# Patient Record
Sex: Female | Born: 1995 | Race: Black or African American | Hispanic: No | Marital: Single | Smoking: Never smoker
Health system: Southern US, Community
[De-identification: ages and names within clinical notes are randomized; demographics above are authoritative.]

## PROBLEM LIST (undated history)

## (undated) ENCOUNTER — Inpatient Hospital Stay (HOSPITAL_COMMUNITY): Payer: Self-pay

## (undated) DIAGNOSIS — I10 Essential (primary) hypertension: Secondary | ICD-10-CM

## (undated) DIAGNOSIS — O139 Gestational [pregnancy-induced] hypertension without significant proteinuria, unspecified trimester: Secondary | ICD-10-CM

## (undated) DIAGNOSIS — D699 Hemorrhagic condition, unspecified: Secondary | ICD-10-CM

## (undated) DIAGNOSIS — A749 Chlamydial infection, unspecified: Secondary | ICD-10-CM

## (undated) DIAGNOSIS — F419 Anxiety disorder, unspecified: Secondary | ICD-10-CM

## (undated) HISTORY — DX: Anxiety disorder, unspecified: F41.9

## (undated) HISTORY — PX: DILATION AND CURETTAGE OF UTERUS: SHX78

## (undated) HISTORY — DX: Essential (primary) hypertension: I10

## (undated) HISTORY — DX: Hemorrhagic condition, unspecified: D69.9

## (undated) HISTORY — PX: WISDOM TOOTH EXTRACTION: SHX21

---
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
## 2016-12-16 ENCOUNTER — Inpatient Hospital Stay (HOSPITAL_COMMUNITY)
Admission: AD | Admit: 2016-12-16 | Discharge: 2016-12-16 | Disposition: A | Discharge status: Home / Self Care | Payer: Self-pay | Source: Ambulatory Visit | Attending: Obstetrics & Gynecology | Admitting: Obstetrics & Gynecology

## 2016-12-16 ENCOUNTER — Encounter (HOSPITAL_COMMUNITY): Payer: Self-pay | Admitting: *Deleted

## 2016-12-16 DIAGNOSIS — B3731 Acute candidiasis of vulva and vagina: Secondary | ICD-10-CM

## 2016-12-16 DIAGNOSIS — B373 Candidiasis of vulva and vagina: Secondary | ICD-10-CM | POA: Insufficient documentation

## 2016-12-16 DIAGNOSIS — R102 Pelvic and perineal pain: Secondary | ICD-10-CM | POA: Insufficient documentation

## 2016-12-16 LAB — CBC WITH DIFFERENTIAL/PLATELET
BASOS ABS: 0 10*3/uL (ref 0.0–0.1)
BASOS PCT: 0 %
EOS PCT: 1 %
Eosinophils Absolute: 0.1 10*3/uL (ref 0.0–0.7)
HCT: 38.6 % (ref 36.0–46.0)
Hemoglobin: 12.5 g/dL (ref 12.0–15.0)
Lymphocytes Relative: 40 %
Lymphs Abs: 3 10*3/uL (ref 0.7–4.0)
MCH: 28.5 pg (ref 26.0–34.0)
MCHC: 32.4 g/dL (ref 30.0–36.0)
MCV: 87.9 fL (ref 78.0–100.0)
MONO ABS: 0.2 10*3/uL (ref 0.1–1.0)
Monocytes Relative: 2 %
Neutro Abs: 4.2 10*3/uL (ref 1.7–7.7)
Neutrophils Relative %: 57 %
PLATELETS: 294 10*3/uL (ref 150–400)
RBC: 4.39 MIL/uL (ref 3.87–5.11)
RDW: 13.5 % (ref 11.5–15.5)
WBC: 7.5 10*3/uL (ref 4.0–10.5)

## 2016-12-16 LAB — URINALYSIS, ROUTINE W REFLEX MICROSCOPIC
Bilirubin Urine: NEGATIVE
Glucose, UA: NEGATIVE mg/dL
Hgb urine dipstick: NEGATIVE
Ketones, ur: NEGATIVE mg/dL
Nitrite: NEGATIVE
PH: 7 (ref 5.0–8.0)
Protein, ur: NEGATIVE mg/dL
SPECIFIC GRAVITY, URINE: 1.02 (ref 1.005–1.030)

## 2016-12-16 LAB — WET PREP, GENITAL
Clue Cells Wet Prep HPF POC: NONE SEEN
SPERM: NONE SEEN
TRICH WET PREP: NONE SEEN

## 2016-12-16 LAB — POCT PREGNANCY, URINE: Preg Test, Ur: NEGATIVE

## 2016-12-16 MED ORDER — NAPROXEN SODIUM 550 MG PO TABS: 550.0000 mg | ORAL_TABLET | Freq: Two times a day (BID) | ORAL | 0 refills | Status: DC

## 2016-12-16 MED ORDER — FLUCONAZOLE 200 MG PO TABS
200.0000 mg | ORAL_TABLET | Freq: Once | ORAL | Status: AC
  Administered 2016-12-16: 200 mg via ORAL
  Filled 2016-12-16: qty 1

## 2016-12-16 MED ORDER — KETOROLAC TROMETHAMINE 60 MG/2ML IM SOLN
30.0000 mg | Freq: Once | INTRAMUSCULAR | Status: AC
  Administered 2016-12-16: 30 mg via INTRAMUSCULAR
  Filled 2016-12-16: qty 2

## 2016-12-16 NOTE — MAU Provider Note (Signed)
[facility] AT [facility] Provider Note   CSN: [id number] Arrival date & time: 12/16/16  1237     History   Chief Complaint Chief Complaint  Patient presents with  . Pelvic Pain  . Possible Pregnancy    HPI Samantha Stanton is a 21 y.o. G1P1  who presents to the MAU with complaint of pelvic pain. The pain started 4 days ago. Patient has been taking Advil without relief. The pain comes and goes and is sharp. Patient's last menstrual period was 11/26/2016. Patient reports having one female sex partner that she has been with for 4 years. Hx of Chlamydia.   Pelvic Pain  The patient's primary symptoms include genital itching, a genital odor, pelvic pain and vaginal discharge. The patient's pertinent negatives include no genital lesions, missed menses or vaginal bleeding. This is a new problem. The current episode started in the past 7 days. The problem occurs intermittently. The problem has been gradually worsening. The pain is moderate. The problem affects both sides. She is not pregnant. Associated symptoms include abdominal pain. Pertinent negatives include no back pain, chills, diarrhea, discolored urine, dysuria, fever, frequency, headaches, hematuria, nausea or vomiting. The vaginal discharge was malodorous and mucoid. There has been no bleeding. She has not been passing clots. She has not been passing tissue. Nothing aggravates the symptoms. She has tried NSAIDs for the symptoms. The treatment provided mild relief. She is sexually active. No, her partner does not have an STD. She uses nothing for contraception. Her past medical history is significant for an STD. There is no history of an abdominal surgery, a Cesarean section, an ectopic pregnancy, endometriosis, a gynecological surgery, herpes simplex or miscarriage.  Possible Pregnancy  Associated symptoms include abdominal pain. Pertinent negatives include no chest pain, chills, fever, headaches, nausea or vomiting.    Past Medical History:    Diagnosis Date  . Medical history non-contributory     There are no active problems to display for this patient.   Past Surgical History:  Procedure Laterality Date  . WISDOM TOOTH EXTRACTION      OB History    Gravida Para Term Preterm AB Living   1 1       1    SAB TAB Ectopic Multiple Live Births                   Home Medications    Prior to Admission medications   Medication Sig Start Date End Date Taking? Authorizing Provider  naproxen sodium (ANAPROX) 220 MG tablet Take 220 mg by mouth 2 (two) times daily as needed (pain).    Yes [provider]  Prenatal Vit-Fe Fumarate-FA (PRENATAL MULTIVITAMIN) TABS tablet Take 1 tablet by mouth daily at 12 noon.   Yes [provider]  naproxen sodium (ANAPROX DS) 550 MG tablet Take 1 tablet (550 mg total) by mouth 2 (two) times daily with a meal. 12/16/16   Janne Napoleon, NP    Family History History reviewed. No pertinent family history.  Social History Social History  Substance Use Topics  . Smoking status: Never Smoker  . Smokeless tobacco: Never Used  . Alcohol use No     Allergies   Aspirin   Review of Systems Review of Systems  Constitutional: Negative for chills and fever.  HENT: Negative.   Eyes: Negative for pain, itching and visual disturbance.  Respiratory: Negative for chest tightness and shortness of breath.   Cardiovascular: Negative for chest pain.  Gastrointestinal: Positive for abdominal pain.  Negative for diarrhea, nausea and vomiting.  Genitourinary: Positive for pelvic pain and vaginal discharge. Negative for dysuria, frequency, hematuria and missed menses.  Musculoskeletal: Negative for back pain.  Skin: Negative for wound.  Neurological: Negative for syncope and headaches.  Psychiatric/Behavioral: The patient is not nervous/anxious.      Physical Exam Updated Vital Signs BP 132/70 (BP Location: Right Arm)   Pulse 89   Temp 98.6 F (37 C) (Oral)   Resp 18   LMP  11/26/2016   SpO2 100%   Physical Exam  Constitutional: She is oriented to person, place, and time. She appears well-developed and well-nourished. No distress.  HENT:  Head: Normocephalic and atraumatic.  Eyes: EOM are normal.  Neck: Neck supple.  Cardiovascular: Normal rate and regular rhythm.   Pulmonary/Chest: Effort normal and breath sounds normal.  Abdominal: Soft. There is no tenderness.  Genitourinary:  Genitourinary Comments: External genitalia without lesions, thick white d/c vaginal vault, mild CMT, no adnexal mass palpated. Uterus without palpable enlargement.   Musculoskeletal: Normal range of motion.  Neurological: She is alert and oriented to person, place, and time. No cranial nerve deficit.  Skin: Skin is warm and dry.  Psychiatric: She has a normal mood and affect. Her behavior is normal.  Nursing note and vitals reviewed.    ED Treatments / Results  Labs (all labs ordered are listed, but only abnormal results are displayed) Labs Reviewed  WET PREP, GENITAL - Abnormal; Notable for the following:       Result Value   Yeast Wet Prep HPF POC PRESENT (*)    WBC, Wet Prep HPF POC MODERATE (*)    All other components within normal limits  URINALYSIS, ROUTINE W REFLEX MICROSCOPIC - Abnormal; Notable for the following:    APPearance HAZY (*)    Leukocytes, UA LARGE (*)    Bacteria, UA RARE (*)    Squamous Epithelial / LPF 6-30 (*)    All other components within normal limits  URINE CULTURE  CBC WITH DIFFERENTIAL/PLATELET  POCT PREGNANCY, URINE  GC/CHLAMYDIA PROBE AMP ([place]) NOT AT [facility]   Radiology No results found.  Procedures Procedures (including critical care time)  Medications Ordered in ED Medications  ketorolac (TORADOL) injection 30 mg (30 mg Intramuscular Given 12/16/16 1501)  fluconazole (DIFLUCAN) tablet 200 mg (200 mg Oral Given 12/16/16 1500)     Initial Impression / Assessment and Plan / ED Course  I have reviewed the triage vital  signs and the nursing notes.  Pertinent lab results that were available during my care of the patient were reviewed by me and considered in my medical decision making (see chart for details). Toradol 30 mg IM given for pain  Cultures, urine, GC and Chlamydia pending Patient declined HIV and RPR testing  Final Clinical Impressions(s) / ED Diagnoses  21 y.o. female with pelvic pain and vaginal d/c stable for d/c without fever and does not have a surgical abdomen. Will treat for yeast infection and with Diflucan and await cultures. Discussed in detail with the patient clinical and lab findings and plan of care. Patient agrees with plan.   Final diagnoses:  Vaginal yeast infection  Pelvic pain    New Prescriptions Current Discharge Medication List

## 2016-12-16 NOTE — Discharge Instructions (Signed)
We have sent your urine for culture and we have sent cultures for Gonorrhea and Chlamydia. If any of these are positive someone will call you. Take the medication for pain as directed. Follow up with your doctor this week. Return as needed for worsening symptoms.

## 2016-12-16 NOTE — MAU Note (Signed)
Pt reports "extreme pelvic pain" x 4 days, also reports vaginal discharge and itching.

## 2016-12-17 LAB — GC/CHLAMYDIA PROBE AMP (~~LOC~~) NOT AT ARMC
CHLAMYDIA, DNA PROBE: POSITIVE — AB
NEISSERIA GONORRHEA: NEGATIVE

## 2016-12-17 LAB — URINE CULTURE

## 2016-12-18 ENCOUNTER — Telehealth: Payer: Self-pay | Admitting: Medical

## 2016-12-18 DIAGNOSIS — A749 Chlamydial infection, unspecified: Secondary | ICD-10-CM

## 2016-12-18 MED ORDER — AZITHROMYCIN 250 MG PO TABS: 1000.0000 mg | ORAL_TABLET | Freq: Once | ORAL | 0 refills | Status: AC

## 2016-12-18 NOTE — Telephone Encounter (Addendum)
Samantha Stanton tested positive for  Chlamydia. Patient was called by RN and allergies and pharmacy confirmed. Rx sent to pharmacy of choice.   Marny LowensteinWenzel, Julie N, PA-C 12/18/2016 11:27 AM       ----- Message from Kathe BectonLori S Berdik, RN sent at 12/18/2016 11:13 AM EDT ----- This patient tested positive for Chlamydia  She is allergic to "aspirin".   I have informed the patient of her results and confirmed her pharmacy is correct in her chart. Please send Rx.   Thank you,   Kathe BectonBerdik, Lori S, RN   [facility]

## 2017-09-02 ENCOUNTER — Emergency Department (HOSPITAL_BASED_OUTPATIENT_CLINIC_OR_DEPARTMENT_OTHER): Payer: Self-pay

## 2017-09-02 ENCOUNTER — Emergency Department (HOSPITAL_BASED_OUTPATIENT_CLINIC_OR_DEPARTMENT_OTHER)
Admission: EM | Admit: 2017-09-02 | Discharge: 2017-09-03 | Disposition: A | Discharge status: Home / Self Care | Payer: Self-pay | Attending: Emergency Medicine | Admitting: Emergency Medicine

## 2017-09-02 ENCOUNTER — Encounter (HOSPITAL_BASED_OUTPATIENT_CLINIC_OR_DEPARTMENT_OTHER): Payer: Self-pay | Admitting: Emergency Medicine

## 2017-09-02 ENCOUNTER — Other Ambulatory Visit: Payer: Self-pay

## 2017-09-02 DIAGNOSIS — N390 Urinary tract infection, site not specified: Secondary | ICD-10-CM | POA: Insufficient documentation

## 2017-09-02 DIAGNOSIS — R112 Nausea with vomiting, unspecified: Secondary | ICD-10-CM | POA: Insufficient documentation

## 2017-09-02 DIAGNOSIS — R1031 Right lower quadrant pain: Secondary | ICD-10-CM | POA: Insufficient documentation

## 2017-09-02 DIAGNOSIS — R197 Diarrhea, unspecified: Secondary | ICD-10-CM

## 2017-09-02 DIAGNOSIS — R109 Unspecified abdominal pain: Secondary | ICD-10-CM

## 2017-09-02 LAB — CBC
HCT: 42 % (ref 36.0–46.0)
Hemoglobin: 14.9 g/dL (ref 12.0–15.0)
MCH: 30.2 pg (ref 26.0–34.0)
MCHC: 35.5 g/dL (ref 30.0–36.0)
MCV: 85 fL (ref 78.0–100.0)
Platelets: 301 10*3/uL (ref 150–400)
RBC: 4.94 MIL/uL (ref 3.87–5.11)
RDW: 13.2 % (ref 11.5–15.5)
WBC: 9.2 10*3/uL (ref 4.0–10.5)

## 2017-09-02 LAB — COMPREHENSIVE METABOLIC PANEL
ALBUMIN: 4 g/dL (ref 3.5–5.0)
ALK PHOS: 39 U/L (ref 38–126)
ALT: 31 U/L (ref 14–54)
AST: 30 U/L (ref 15–41)
Anion gap: 11 (ref 5–15)
BILIRUBIN TOTAL: 0.3 mg/dL (ref 0.3–1.2)
BUN: 17 mg/dL (ref 6–20)
CALCIUM: 9.9 mg/dL (ref 8.9–10.3)
CO2: 24 mmol/L (ref 22–32)
CREATININE: 1.5 mg/dL — AB (ref 0.44–1.00)
Chloride: 103 mmol/L (ref 101–111)
GFR calc Af Amer: 57 mL/min — ABNORMAL LOW (ref >60)
GFR calc non Af Amer: 49 mL/min — ABNORMAL LOW (ref >60)
GLUCOSE: 106 mg/dL — AB (ref 65–99)
Potassium: 3.9 mmol/L (ref 3.5–5.1)
Sodium: 138 mmol/L (ref 135–145)
TOTAL PROTEIN: 8.3 g/dL — AB (ref 6.5–8.1)

## 2017-09-02 LAB — LIPASE, BLOOD: Lipase: 34 U/L (ref 11–51)

## 2017-09-02 LAB — URINALYSIS, MICROSCOPIC (REFLEX): RBC / HPF: NONE SEEN RBC/hpf (ref 0–5)

## 2017-09-02 LAB — URINALYSIS, ROUTINE W REFLEX MICROSCOPIC
BILIRUBIN URINE: NEGATIVE
Glucose, UA: NEGATIVE mg/dL
HGB URINE DIPSTICK: NEGATIVE
KETONES UR: NEGATIVE mg/dL
NITRITE: NEGATIVE
Protein, ur: NEGATIVE mg/dL
Specific Gravity, Urine: <1.005 — ABNORMAL LOW (ref 1.005–1.030)
pH: 7 (ref 5.0–8.0)

## 2017-09-02 LAB — PREGNANCY, URINE: PREG TEST UR: NEGATIVE

## 2017-09-02 MED ORDER — MORPHINE SULFATE (PF) 4 MG/ML IV SOLN
4.0000 mg | Freq: Once | INTRAVENOUS | Status: AC
  Administered 2017-09-02: 4 mg via INTRAVENOUS
  Filled 2017-09-02: qty 1

## 2017-09-02 MED ORDER — ONDANSETRON 4 MG PO TBDP
4.0000 mg | ORAL_TABLET | Freq: Once | ORAL | Status: AC
  Administered 2017-09-02: 4 mg via ORAL
  Filled 2017-09-02: qty 1

## 2017-09-02 MED ORDER — SODIUM CHLORIDE 0.9 % IV BOLUS
1000.0000 mL | Freq: Once | INTRAVENOUS | Status: AC
  Administered 2017-09-02: 1000 mL via INTRAVENOUS

## 2017-09-02 MED ORDER — IOPAMIDOL (ISOVUE-300) INJECTION 61%
100.0000 mL | Freq: Once | INTRAVENOUS | Status: AC | PRN
  Administered 2017-09-03: 100 mL via INTRAVENOUS

## 2017-09-02 NOTE — ED Provider Notes (Signed)
[facility] EMERGENCY DEPARTMENT Provider Note   CSN: [id number] Arrival date & time: 09/02/17  1832     History   Chief Complaint Chief Complaint  Patient presents with  . Abdominal Pain    HPI Samantha Stanton is a 22 y.o. female.  Complains of right-sided flank pain radiating to right abdomen onset 6 AM today associated symptoms include vomiting 25 times and 10 episodes of diarrhea.  She feels thirsty.  She denies feeling hungry.  She treated herself with "Backaid" without relief.  Last normal menstrual period approximately a week ago.  Denies vaginal discharge.  Denies fever.  Pain is worse when she vomits or with lying in certain positions and improved with other positions pain is severe at present.  HPI  Past Medical History:  Diagnosis Date  . Medical history non-contributory     There are no active problems to display for this patient.   Past Surgical History:  Procedure Laterality Date  . WISDOM TOOTH EXTRACTION       OB History    Gravida  1   Para  1   Term      Preterm      AB      Living  1     SAB      TAB      Ectopic      Multiple      Live Births               Home Medications    Prior to Admission medications   Medication Sig Start Date End Date Taking? Authorizing Provider  naproxen sodium (ANAPROX DS) 550 MG tablet Take 1 tablet (550 mg total) by mouth 2 (two) times daily with a meal. 12/16/16   Janne NapoleonNeese, Hope M, NP  Prenatal Vit-Fe Fumarate-FA (PRENATAL MULTIVITAMIN) TABS tablet Take 1 tablet by mouth daily at 12 noon.    [provider]    Family History History reviewed. No pertinent family history.  Social History Social History   Tobacco Use  . Smoking status: Never Smoker  . Smokeless tobacco: Never Used  Substance Use Topics  . Alcohol use: No  . Drug use: No     Allergies   Aspirin   Review of Systems Review of Systems  Constitutional: Negative.   HENT: Negative.   Respiratory:  Negative.   Cardiovascular: Negative.   Gastrointestinal: Positive for abdominal pain and nausea.  Genitourinary: Positive for flank pain.  Skin: Negative.   Neurological: Negative.   Psychiatric/Behavioral: Negative.   All other systems reviewed and are negative.    Physical Exam Updated Vital Signs BP 131/85 (BP Location: Right Arm)   Pulse 97   Temp 99.1 F (37.3 C) (Oral)   Resp 18   Ht 5\' 6"  (1.676 m)   Wt 84.8 kg (187 lb)   LMP 08/26/2017 (Approximate)   SpO2 100%   BMI 30.18 kg/m   Physical Exam  Constitutional: She appears well-developed and well-nourished.  HENT:  Head: Normocephalic and atraumatic.  Eyes: Pupils are equal, round, and reactive to light. Conjunctivae are normal.  Neck: Neck supple. No tracheal deviation present. No thyromegaly present.  Cardiovascular: Normal rate and regular rhythm.  No murmur heard. Pulmonary/Chest: Effort normal and breath sounds normal.  Abdominal: Soft. Bowel sounds are normal. She exhibits no distension. There is tenderness.  Morbidly obese, dermal active bowel sounds diffusely tender.  No guarding  Genitourinary:  Genitourinary Comments: Positive right flank tenderness  Musculoskeletal: Normal range  of motion. She exhibits no edema or tenderness.  Neurological: She is alert. Coordination normal.  Skin: Skin is warm and dry. No rash noted.  Psychiatric: She has a normal mood and affect.  Nursing note and vitals reviewed.    ED Treatments / Results  Labs (all labs ordered are listed, but only abnormal results are displayed) Labs Reviewed  URINALYSIS, ROUTINE W REFLEX MICROSCOPIC - Abnormal; Notable for the following components:      Result Value   Specific Gravity, Urine <1.005 (*)    Leukocytes, UA TRACE (*)    All other components within normal limits  URINALYSIS, MICROSCOPIC (REFLEX) - Abnormal; Notable for the following components:   Bacteria, UA RARE (*)    Squamous Epithelial / LPF 0-5 (*)    All other  components within normal limits  PREGNANCY, URINE  LIPASE, BLOOD  COMPREHENSIVE METABOLIC PANEL  CBC    EKG None  Radiology No results found.  Procedures Procedures (including critical care time)  Medications Ordered in ED Medications  sodium chloride 0.9 % bolus 1,000 mL (has no administration in time range)  morphine 4 MG/ML injection 4 mg (has no administration in time range)  ondansetron (ZOFRAN-ODT) disintegrating tablet 4 mg (4 mg Oral Given 09/02/17 1952)    Results for orders placed or performed during the hospital encounter of 09/02/17  Lipase, blood  Result Value Ref Range   Lipase 34 11 - 51 U/L  Comprehensive metabolic panel  Result Value Ref Range   Sodium 138 135 - 145 mmol/L   Potassium 3.9 3.5 - 5.1 mmol/L   Chloride 103 101 - 111 mmol/L   CO2 24 22 - 32 mmol/L   Glucose, Bld 106 (H) 65 - 99 mg/dL   BUN 17 6 - 20 mg/dL   Creatinine, Ser 1.61 (H) 0.44 - 1.00 mg/dL   Calcium 9.9 8.9 - 09.6 mg/dL   Total Protein 8.3 (H) 6.5 - 8.1 g/dL   Albumin 4.0 3.5 - 5.0 g/dL   AST 30 15 - 41 U/L   ALT 31 14 - 54 U/L   Alkaline Phosphatase 39 38 - 126 U/L   Total Bilirubin 0.3 0.3 - 1.2 mg/dL   GFR calc non Af Amer 49 (L) >60 mL/min   GFR calc Af Amer 57 (L) >60 mL/min   Anion gap 11 5 - 15  CBC  Result Value Ref Range   WBC 9.2 4.0 - 10.5 K/uL   RBC 4.94 3.87 - 5.11 MIL/uL   Hemoglobin 14.9 12.0 - 15.0 g/dL   HCT 04.5 40.9 - 81.1 %   MCV 85.0 78.0 - 100.0 fL   MCH 30.2 26.0 - 34.0 pg   MCHC 35.5 30.0 - 36.0 g/dL   RDW 91.4 78.2 - 95.6 %   Platelets 301 150 - 400 K/uL  Urinalysis, Routine w reflex microscopic  Result Value Ref Range   Color, Urine YELLOW YELLOW   APPearance CLEAR CLEAR   Specific Gravity, Urine <1.005 (L) 1.005 - 1.030   pH 7.0 5.0 - 8.0   Glucose, UA NEGATIVE NEGATIVE mg/dL   Hgb urine dipstick NEGATIVE NEGATIVE   Bilirubin Urine NEGATIVE NEGATIVE   Ketones, ur NEGATIVE NEGATIVE mg/dL   Protein, ur NEGATIVE NEGATIVE mg/dL   Nitrite  NEGATIVE NEGATIVE   Leukocytes, UA TRACE (A) NEGATIVE  Pregnancy, urine  Result Value Ref Range   Preg Test, Ur NEGATIVE NEGATIVE  Urinalysis, Microscopic (reflex)  Result Value Ref Range   RBC / HPF NONE  SEEN 0 - 5 RBC/hpf   WBC, UA 0-5 0 - 5 WBC/hpf   Bacteria, UA RARE (A) NONE SEEN   Squamous Epithelial / LPF 0-5 (A) NONE SEEN   No results found. Initial Impression / Assessment and Plan / ED Course  I have reviewed the triage vital signs and the nursing notes.  Pertinent labs & imaging results that were available during my care of the patient were reviewed by me and considered in my medical decision making (see chart for details).     12:25 AM reports no improvement of pain after morphine 4 mg IV.  An additional morphine 4 mg IV ordered by me 12:35 AM patient complains of nausea.  IV Reglan ordered.  Patient signed out to Dr. Blinda Leatherwood 12:35 AM.  Lab work consistent with mild renal insufficiency #4 renal insufficiency Final Clinical Impressions(s) / ED Diagnoses  Diagnoses #1 right flank pain #2 abdominal pain #3 nausea vomiting diarrhea Final diagnoses:  None   #4 renal insufficiency ED Discharge Orders    None       Doug Sou, MD 09/03/17 [phone]

## 2017-09-02 NOTE — ED Triage Notes (Signed)
Patient reports right sided abdominal pain which radiates into back.  Reports N/V/D which began this morning.  Denies dysuria, hematuria.

## 2017-09-03 MED ORDER — TRAMADOL HCL 50 MG PO TABS: 50.0000 mg | ORAL_TABLET | Freq: Four times a day (QID) | ORAL | 0 refills | Status: DC | PRN

## 2017-09-03 MED ORDER — METOCLOPRAMIDE HCL 5 MG/ML IJ SOLN
5.0000 mg | Freq: Once | INTRAMUSCULAR | Status: AC
  Administered 2017-09-03: 5 mg via INTRAVENOUS
  Filled 2017-09-03: qty 2

## 2017-09-03 MED ORDER — HYDROMORPHONE HCL 1 MG/ML IJ SOLN
0.5000 mg | Freq: Once | INTRAMUSCULAR | Status: AC
  Administered 2017-09-03: 0.5 mg via INTRAVENOUS
  Filled 2017-09-03: qty 1

## 2017-09-03 MED ORDER — SODIUM CHLORIDE 0.9 % IV BOLUS
1000.0000 mL | Freq: Once | INTRAVENOUS | Status: AC
  Administered 2017-09-03: 1000 mL via INTRAVENOUS

## 2017-09-03 MED ORDER — PROMETHAZINE HCL 25 MG PO TABS: 25.0000 mg | ORAL_TABLET | Freq: Four times a day (QID) | ORAL | 0 refills | Status: DC | PRN

## 2017-09-03 MED ORDER — ONDANSETRON 4 MG PO TBDP
4.0000 mg | ORAL_TABLET | Freq: Once | ORAL | Status: AC
  Administered 2017-09-03: 4 mg via ORAL
  Filled 2017-09-03: qty 1

## 2017-09-03 MED ORDER — SODIUM CHLORIDE 0.9 % IV SOLN
1.0000 g | Freq: Once | INTRAVENOUS | Status: AC
  Administered 2017-09-03: 1 g via INTRAVENOUS
  Filled 2017-09-03: qty 10

## 2017-09-03 MED ORDER — CEPHALEXIN 500 MG PO CAPS: 500.0000 mg | ORAL_CAPSULE | Freq: Two times a day (BID) | ORAL | 0 refills | Status: DC

## 2017-09-03 MED ORDER — MORPHINE SULFATE (PF) 4 MG/ML IV SOLN
4.0000 mg | Freq: Once | INTRAVENOUS | Status: AC
  Administered 2017-09-03: 4 mg via INTRAVENOUS
  Filled 2017-09-03: qty 1

## 2017-09-03 NOTE — ED Provider Notes (Addendum)
Patient was signed out to me by Dr. Rennis ChrisJacobowitz to follow-up on CT scan.  Patient complaining of bilateral flank pain.  Laboratory workup was negative other than mildly elevated creatinine, baseline unknown.  CT scan showed bilateral enhancement of the kidneys, possibly secondary to infection.  Urinalysis did not seem obviously infected, culture sent.  Will empirically treat, however.  Patient appears well.  She does not require hospitalization at this time.  She is afebrile with normal vital signs.  No concern for sepsis.  She reports multiple episodes of emesis and diarrhea prior to arrival, but this has resolved here in the ER.  Will treat her with analgesia and antiemetics, continue antibiotics.  She will need to follow-up with her primary doctor for recheck of her creatinine and urinalysis.  She likely will need referral to nephrology and possible urology based on her CAT scan findings.  She was given a copy of her report.  Addendum: After IV was pulled at time of discharge she vomited.  I did recheck her.  She is still not having pain.  She reports that she drinks a lot of Sprite because she has been feeling so thirsty and thinks that is what made her vomit.  I offered her admission but she reports that she is still feeling well and would like to go home.   Gilda CreasePollina, Christopher J, MD 09/03/17 [id number]    Gilda CreasePollina, Christopher J, MD 09/03/17 [phone]

## 2017-09-03 NOTE — ED Notes (Signed)
ED Provider at bedside. 

## 2017-09-04 LAB — URINE CULTURE

## 2018-04-29 ENCOUNTER — Emergency Department (HOSPITAL_BASED_OUTPATIENT_CLINIC_OR_DEPARTMENT_OTHER)
Admission: EM | Admit: 2018-04-29 | Discharge: 2018-04-29 | Disposition: A | Discharge status: Home / Self Care | Payer: 59 | Attending: Emergency Medicine | Admitting: Emergency Medicine

## 2018-04-29 ENCOUNTER — Emergency Department (HOSPITAL_BASED_OUTPATIENT_CLINIC_OR_DEPARTMENT_OTHER): Payer: 59

## 2018-04-29 ENCOUNTER — Encounter (HOSPITAL_BASED_OUTPATIENT_CLINIC_OR_DEPARTMENT_OTHER): Payer: Self-pay

## 2018-04-29 ENCOUNTER — Encounter (HOSPITAL_BASED_OUTPATIENT_CLINIC_OR_DEPARTMENT_OTHER): Payer: Self-pay | Admitting: *Deleted

## 2018-04-29 ENCOUNTER — Other Ambulatory Visit: Payer: Self-pay

## 2018-04-29 DIAGNOSIS — Z3A01 Less than 8 weeks gestation of pregnancy: Secondary | ICD-10-CM | POA: Insufficient documentation

## 2018-04-29 DIAGNOSIS — Z79899 Other long term (current) drug therapy: Secondary | ICD-10-CM | POA: Insufficient documentation

## 2018-04-29 DIAGNOSIS — O26891 Other specified pregnancy related conditions, first trimester: Secondary | ICD-10-CM | POA: Insufficient documentation

## 2018-04-29 DIAGNOSIS — R102 Pelvic and perineal pain: Secondary | ICD-10-CM | POA: Diagnosis not present

## 2018-04-29 DIAGNOSIS — O9989 Other specified diseases and conditions complicating pregnancy, childbirth and the puerperium: Secondary | ICD-10-CM | POA: Diagnosis present

## 2018-04-29 LAB — CBC WITH DIFFERENTIAL/PLATELET
Abs Immature Granulocytes: 0.02 10*3/uL (ref 0.00–0.07)
Basophils Absolute: 0 10*3/uL (ref 0.0–0.1)
Basophils Relative: 1 %
Eosinophils Absolute: 0.1 10*3/uL (ref 0.0–0.5)
Eosinophils Relative: 1 %
HCT: 41.3 % (ref 36.0–46.0)
Hemoglobin: 12.7 g/dL (ref 12.0–15.0)
Immature Granulocytes: 0 %
Lymphocytes Relative: 42 %
Lymphs Abs: 3.1 10*3/uL (ref 0.7–4.0)
MCH: 27.2 pg (ref 26.0–34.0)
MCHC: 30.8 g/dL (ref 30.0–36.0)
MCV: 88.4 fL (ref 80.0–100.0)
Monocytes Absolute: 0.5 10*3/uL (ref 0.1–1.0)
Monocytes Relative: 7 %
Neutro Abs: 3.6 10*3/uL (ref 1.7–7.7)
Neutrophils Relative %: 49 %
Platelets: 337 10*3/uL (ref 150–400)
RBC: 4.67 MIL/uL (ref 3.87–5.11)
RDW: 13.8 % (ref 11.5–15.5)
WBC: 7.3 10*3/uL (ref 4.0–10.5)
nRBC: 0 % (ref 0.0–0.2)

## 2018-04-29 LAB — COMPREHENSIVE METABOLIC PANEL
ALT: 75 U/L — ABNORMAL HIGH (ref 0–44)
AST: 51 U/L — ABNORMAL HIGH (ref 15–41)
Albumin: 3.9 g/dL (ref 3.5–5.0)
Alkaline Phosphatase: 38 U/L (ref 38–126)
Anion gap: 7 (ref 5–15)
BUN: 8 mg/dL (ref 6–20)
CO2: 25 mmol/L (ref 22–32)
Calcium: 9.5 mg/dL (ref 8.9–10.3)
Chloride: 101 mmol/L (ref 98–111)
Creatinine, Ser: 0.78 mg/dL (ref 0.44–1.00)
GFR calc Af Amer: >60 mL/min (ref >60)
GFR calc non Af Amer: >60 mL/min (ref >60)
Glucose, Bld: 110 mg/dL — ABNORMAL HIGH (ref 70–99)
Potassium: 3.5 mmol/L (ref 3.5–5.1)
Sodium: 133 mmol/L — ABNORMAL LOW (ref 135–145)
Total Bilirubin: 0.4 mg/dL (ref 0.3–1.2)
Total Protein: 8.2 g/dL — ABNORMAL HIGH (ref 6.5–8.1)

## 2018-04-29 LAB — WET PREP, GENITAL
Sperm: NONE SEEN
Trich, Wet Prep: NONE SEEN
Yeast Wet Prep HPF POC: NONE SEEN

## 2018-04-29 LAB — URINALYSIS, ROUTINE W REFLEX MICROSCOPIC
Bilirubin Urine: NEGATIVE
Glucose, UA: NEGATIVE mg/dL
Hgb urine dipstick: NEGATIVE
Ketones, ur: NEGATIVE mg/dL
Leukocytes, UA: NEGATIVE
Nitrite: NEGATIVE
Protein, ur: NEGATIVE mg/dL
Specific Gravity, Urine: 1.02 (ref 1.005–1.030)
pH: 6.5 (ref 5.0–8.0)

## 2018-04-29 LAB — PREGNANCY, URINE: Preg Test, Ur: POSITIVE — AB

## 2018-04-29 LAB — HCG, QUANTITATIVE, PREGNANCY: hCG, Beta Chain, Quant, S: 12829 m[IU]/mL — ABNORMAL HIGH (ref <5)

## 2018-04-29 NOTE — ED Triage Notes (Signed)
Abdominal pain since this am. Mucus discharge this am. She is pregnant but does not know how many weeks. She has a large gravid abdomen.

## 2018-04-29 NOTE — ED Notes (Signed)
Patient transported to Ultrasound 

## 2018-04-29 NOTE — Progress Notes (Signed)
1839 Received call regarding this 22 yo G2P1 in with report of pregnancy at unknown gestation.  Pt reported to ED staff that she had recently been told she was pregnant but she was not certain of her LMP due to irregular menses.  Abdomen appears gravid per ED staff. ED staff having difficulty finding a FHR.[id number] ED staff report no fetus found on bedside US.

## 2018-04-29 NOTE — ED Notes (Signed)
Lab aware of orders for blood and urine

## 2018-04-29 NOTE — Discharge Instructions (Signed)
Please see your OB/GYN or the women's outpatient clinic in 2 days for repeat hCG levels.  You will also need a repeat ultrasound in 2 weeks.  Please return the emergency department or go to the [facility] emergency department if you develop any new or worsening symptoms including severe, worsening pain, vaginal bleeding, or any other concerning symptoms.  Continue taking your prenatal vitamins.

## 2018-04-29 NOTE — ED Notes (Signed)
Pt returned from ultrasound

## 2018-04-29 NOTE — ED Notes (Signed)
Provider in as evaluated as possible early pregnancy, TONO discontinued.

## 2018-04-29 NOTE — ED Provider Notes (Addendum)
[facility] EMERGENCY DEPARTMENT Provider Note   CSN: [id number] Arrival date & time: 04/29/18  1818     History   Chief Complaint Chief Complaint  Patient presents with  . Abdominal Pain  . Pregnant Unknown LMP    HPI Samantha Stanton is a 22 y.o. G2, P57 female who presents with reported positive urine pregnancy test at a doctor's appointment yesterday and began having vaginal discharge and an aching suprapubic pain today.  Patient denies any vaginal bleeding.  She has history of miscarriage 6 months ago.  She has 1 living child.  Patient denies any urinary symptoms or other vaginal discharge.  She denies any chest pain, shortness of breath, vomiting.  She has had some nausea.  HPI  Past Medical History:  Diagnosis Date  . Medical history non-contributory     There are no active problems to display for this patient.   Past Surgical History:  Procedure Laterality Date  . WISDOM TOOTH EXTRACTION       OB History    Gravida  2   Para  1   Term      Preterm      AB      Living  1     SAB      TAB      Ectopic      Multiple      Live Births               Home Medications    Prior to Admission medications   Medication Sig Start Date End Date Taking? Authorizing Provider  cephALEXin (KEFLEX) 500 MG capsule Take 1 capsule (500 mg total) by mouth 2 (two) times daily. 09/03/17   Gilda Crease, MD  naproxen sodium (ANAPROX DS) 550 MG tablet Take 1 tablet (550 mg total) by mouth 2 (two) times daily with a meal. 12/16/16   Janne Napoleon, NP  Prenatal Vit-Fe Fumarate-FA (PRENATAL MULTIVITAMIN) TABS tablet Take 1 tablet by mouth daily at 12 noon.    [provider]  promethazine (PHENERGAN) 25 MG tablet Take 1 tablet (25 mg total) by mouth every 6 (six) hours as needed for nausea or vomiting. 09/03/17   Gilda Crease, MD  traMADol (ULTRAM) 50 MG tablet Take 1 tablet (50 mg total) by mouth every 6 (six) hours as needed. 09/03/17    Gilda Crease, MD    Family History No family history on file.  Social History Social History   Tobacco Use  . Smoking status: Never Smoker  . Smokeless tobacco: Never Used  Substance Use Topics  . Alcohol use: No  . Drug use: No     Allergies   Aspirin   Review of Systems Review of Systems  Constitutional: Negative for chills and fever.  HENT: Negative for facial swelling and sore throat.   Respiratory: Negative for shortness of breath.   Cardiovascular: Negative for chest pain.  Gastrointestinal: Positive for abdominal pain. Negative for nausea and vomiting.  Genitourinary: Positive for vaginal discharge. Negative for dysuria and vaginal bleeding.  Musculoskeletal: Negative for back pain.  Skin: Negative for rash and wound.  Neurological: Negative for headaches.  Psychiatric/Behavioral: The patient is not nervous/anxious.      Physical Exam Updated Vital Signs BP 123/72 (BP Location: Right Arm)   Pulse 95   Temp 98.8 F (37.1 C) (Oral)   Resp 18   Ht 5\' 6"  (1.676 m)   Wt 108.9 kg   SpO2 99%  BMI 38.74 kg/m   Physical Exam  Constitutional: She appears well-developed and well-nourished. No distress.  HENT:  Head: Normocephalic and atraumatic.  Mouth/Throat: Oropharynx is clear and moist. No oropharyngeal exudate.  Eyes: Pupils are equal, round, and reactive to light. Conjunctivae are normal. Right eye exhibits no discharge. Left eye exhibits no discharge. No scleral icterus.  Neck: Normal range of motion. Neck supple. No thyromegaly present.  Cardiovascular: Normal rate, regular rhythm, normal heart sounds and intact distal pulses. Exam reveals no gallop and no friction rub.  No murmur heard. Pulmonary/Chest: Effort normal and breath sounds normal. No stridor. No respiratory distress. She has no wheezes. She has no rales.  Abdominal: Soft. Bowel sounds are normal. She exhibits no distension. There is tenderness in the suprapubic area. There is no  rebound and no guarding.  Genitourinary: Uterus is not enlarged. Cervix exhibits discharge. Cervix exhibits no motion tenderness. Right adnexum displays tenderness. Vaginal discharge (white) found.  Musculoskeletal: She exhibits no edema.  Lymphadenopathy:    She has no cervical adenopathy.  Neurological: She is alert. Coordination normal.  Skin: Skin is warm and dry. No rash noted. She is not diaphoretic. No pallor.  Psychiatric: She has a normal mood and affect.  Nursing note and vitals reviewed.    ED Treatments / Results  Labs (all labs ordered are listed, but only abnormal results are displayed) Labs Reviewed  WET PREP, GENITAL - Abnormal; Notable for the following components:      Result Value   Clue Cells Wet Prep HPF POC PRESENT (*)    WBC, Wet Prep HPF POC MANY (*)    All other components within normal limits  COMPREHENSIVE METABOLIC PANEL - Abnormal; Notable for the following components:   Sodium 133 (*)    Glucose, Bld 110 (*)    Total Protein 8.2 (*)    AST 51 (*)    ALT 75 (*)    All other components within normal limits  HCG, QUANTITATIVE, PREGNANCY - Abnormal; Notable for the following components:   hCG, Beta Chain, Quant, S 12,829 (*)    All other components within normal limits  PREGNANCY, URINE - Abnormal; Notable for the following components:   Preg Test, Ur POSITIVE (*)    All other components within normal limits  CBC WITH DIFFERENTIAL/PLATELET  URINALYSIS, ROUTINE W REFLEX MICROSCOPIC  GC/CHLAMYDIA PROBE AMP ([place]) NOT AT [facility]    EKG None  Radiology [place] Ob Comp < 14 Wks  Result Date: 04/29/2018 CLINICAL DATA:  Initial evaluation for acute pelvic pain for 2 days, early pregnancy. EXAM: OBSTETRIC <14 WK US AND TRANSVAGINAL OB US TECHNIQUE: Both transabdominal and transvaginal ultrasound examinations were performed for complete evaluation of the gestation as well as the maternal uterus, adnexal regions, and pelvic cul-de-sac. Transvaginal  technique was performed to assess early pregnancy. COMPARISON:  None. FINDINGS: Intrauterine gestational sac: Single Yolk sac:  Not definitely visualized. Embryo:  Negative. Cardiac Activity: N/A Heart Rate: N/A  bpm MSD: 10 mm   5 w   5 d Subchorionic hemorrhage:  None visualized. Maternal uterus/adnexae: Left ovary normal in appearance. Apparent 2.6 cm echogenic area within the right ovary noted without posterior shadowing, of uncertain etiology, but could reflect a complex or possibly hemorrhagic follicle. No free fluid within the pelvis. IMPRESSION: 1. Probable early intrauterine gestational sac, but no yolk sac, fetal pole, or cardiac activity yet visualized. Recommend follow-up quantitative B-HCG levels and follow-up US in 14 days to confirm and assess viability. This  recommendation follows SRU consensus guidelines: Diagnostic Criteria for Nonviable Pregnancy Early in the First Trimester. Malva Limes Med 2013; 161:0960-45. 2. No other acute maternal uterine or adnexal abnormality identified. Electronically Signed   By: Rise Mu M.D.   On: 04/29/2018 20:48   US Ob Transvaginal  Result Date: 04/29/2018 CLINICAL DATA:  Initial evaluation for acute pelvic pain for 2 days, early pregnancy. EXAM: OBSTETRIC <14 WK [place] AND TRANSVAGINAL OB US TECHNIQUE: Both transabdominal and transvaginal ultrasound examinations were performed for complete evaluation of the gestation as well as the maternal uterus, adnexal regions, and pelvic cul-de-sac. Transvaginal technique was performed to assess early pregnancy. COMPARISON:  None. FINDINGS: Intrauterine gestational sac: Single Yolk sac:  Not definitely visualized. Embryo:  Negative. Cardiac Activity: N/A Heart Rate: N/A  bpm MSD: 10 mm   5 w   5 d Subchorionic hemorrhage:  None visualized. Maternal uterus/adnexae: Left ovary normal in appearance. Apparent 2.6 cm echogenic area within the right ovary noted without posterior shadowing, of uncertain etiology, but could  reflect a complex or possibly hemorrhagic follicle. No free fluid within the pelvis. IMPRESSION: 1. Probable early intrauterine gestational sac, but no yolk sac, fetal pole, or cardiac activity yet visualized. Recommend follow-up quantitative B-HCG levels and follow-up US in 14 days to confirm and assess viability. This recommendation follows SRU consensus guidelines: Diagnostic Criteria for Nonviable Pregnancy Early in the First Trimester. Malva Limes Med 2013; 409:8119-14. 2. No other acute maternal uterine or adnexal abnormality identified. Electronically Signed   By: Rise Mu M.D.   On: 04/29/2018 20:48    Procedures Procedures (including critical care time)  Medications Ordered in ED Medications - No data to display   Initial Impression / Assessment and Plan / ED Course  I have reviewed the triage vital signs and the nursing notes.  Pertinent labs & imaging results that were available during my care of the patient were reviewed by me and considered in my medical decision making (see chart for details).     Patient with probable early intrauterine gestational sac, but no yolk sac, fetal pole, or cardiac activity visualized.  hCG 12,829.  UA is negative.  Patient does have mild elevation in LFTs, but will refer for further work-up of this.  Patient to early in pregnancy for concern of preeclampsia or pregnancy related elevation.  Patient advised to follow-up in 2 days for hCG recheck.  Recheck ultrasound in 14 days.  Will defer to OB/GYN to treat clue cells, if desired. Continue prenatal vitamins. Return precautions discussed.  Patient understands and agrees with plan.  Patient vitals stable throughout ED course and discharged in satisfactory condition. I discussed patient case with Dr. Clayborne Dana who guided the patient's management and agrees with plan.   Final Clinical Impressions(s) / ED Diagnoses   Final diagnoses:  Pelvic pain in pregnancy, antepartum, first trimester    ED  Discharge Orders    None             Verdis Prime 04/29/18 2337    Marily Memos, MD 04/29/18 [phone]

## 2018-04-30 LAB — GC/CHLAMYDIA PROBE AMP (~~LOC~~) NOT AT ARMC
Chlamydia: NEGATIVE
Neisseria Gonorrhea: NEGATIVE

## 2018-05-28 NOTE — L&D Delivery Note (Addendum)
Patient: Samantha Stanton MRN: [id number]  GBS status: Negative  Patient is a 23 y.o. now [id number] s/p NSVD at [redacted]w[redacted]d, who was admitted for IOL due to pre-eclampsia with severe features. AROM 6h 64m prior to delivery with clear fluid.  Delivery Note At 2:52 AM a viable female was delivered via Vaginal, Spontaneous (Presentation: LOA).  APGAR: 8, 9; weight pending.   Placenta status: Spontaneous, Intact.  Cord: 3 vessels; loose nuchal x1.  Head delivered LOA. Loose nuchal cord present x1, which was reduced at perineum. Shoulder and body delivered in usual fashion. Infant with spontaneous cry, placed on mother's abdomen, dried and bulb suctioned. Cord clamped x 2 after 1-minute delay, and cut by family member. Cord blood drawn. Placenta delivered spontaneously with gentle cord traction. Fundus firm with massage and Pitocin. Perineum inspected and found to have no lacerations.  Anesthesia: Epidural  Episiotomy: None Lacerations: None Suture Repair: None Est. Blood Loss (mL): 505  Mom to postpartum. Will continue Mg for 24h PP. Baby to Couplet care / Skin to Skin.  Genia Del 12/14/2018, 3:35 AM  OB FELLOW DELIVERY ATTESTATION  I was gloved and present for the delivery in its entirety, and I agree with the above resident's note.    Phill Myron, D.O. OB Fellow  12/14/2018, 8:03 AM

## 2018-06-17 ENCOUNTER — Inpatient Hospital Stay (HOSPITAL_COMMUNITY): Payer: 59

## 2018-06-17 ENCOUNTER — Inpatient Hospital Stay (HOSPITAL_COMMUNITY)
Admission: AD | Admit: 2018-06-17 | Discharge: 2018-06-17 | Disposition: A | Discharge status: Home / Self Care | Payer: 59 | Attending: Obstetrics and Gynecology | Admitting: Obstetrics and Gynecology

## 2018-06-17 ENCOUNTER — Other Ambulatory Visit: Payer: Self-pay

## 2018-06-17 ENCOUNTER — Encounter (HOSPITAL_COMMUNITY): Payer: Self-pay

## 2018-06-17 DIAGNOSIS — O98811 Other maternal infectious and parasitic diseases complicating pregnancy, first trimester: Secondary | ICD-10-CM | POA: Diagnosis not present

## 2018-06-17 DIAGNOSIS — O418X10 Other specified disorders of amniotic fluid and membranes, first trimester, not applicable or unspecified: Secondary | ICD-10-CM | POA: Diagnosis not present

## 2018-06-17 DIAGNOSIS — Z3A12 12 weeks gestation of pregnancy: Secondary | ICD-10-CM | POA: Insufficient documentation

## 2018-06-17 DIAGNOSIS — B373 Candidiasis of vulva and vagina: Secondary | ICD-10-CM | POA: Insufficient documentation

## 2018-06-17 DIAGNOSIS — O209 Hemorrhage in early pregnancy, unspecified: Secondary | ICD-10-CM | POA: Insufficient documentation

## 2018-06-17 DIAGNOSIS — B3731 Acute candidiasis of vulva and vagina: Secondary | ICD-10-CM

## 2018-06-17 DIAGNOSIS — O98819 Other maternal infectious and parasitic diseases complicating pregnancy, unspecified trimester: Secondary | ICD-10-CM

## 2018-06-17 DIAGNOSIS — O468X1 Other antepartum hemorrhage, first trimester: Secondary | ICD-10-CM

## 2018-06-17 LAB — WET PREP, GENITAL
Clue Cells Wet Prep HPF POC: NONE SEEN
SPERM: NONE SEEN
TRICH WET PREP: NONE SEEN

## 2018-06-17 LAB — URINALYSIS, ROUTINE W REFLEX MICROSCOPIC
BILIRUBIN URINE: NEGATIVE
Glucose, UA: NEGATIVE mg/dL
HGB URINE DIPSTICK: NEGATIVE
Ketones, ur: NEGATIVE mg/dL
Nitrite: NEGATIVE
PROTEIN: NEGATIVE mg/dL
SPECIFIC GRAVITY, URINE: 1.015 (ref 1.005–1.030)
pH: 7 (ref 5.0–8.0)

## 2018-06-17 MED ORDER — TERCONAZOLE 0.4 % VA CREA: 1.0000 | TOPICAL_CREAM | Freq: Every day | VAGINAL | 0 refills | Status: DC

## 2018-06-17 NOTE — Discharge Instructions (Signed)
First Trimester of Pregnancy  The first trimester of pregnancy is from week 1 until the end of week 13 (months 1 through 3). A week after a sperm fertilizes an egg, the egg will implant on the wall of the uterus. This embryo will begin to develop into a baby. Genes from you and your partner will form the baby. The female genes will determine whether the baby will be a boy or a girl. At 6-8 weeks, the eyes and face will be formed, and the heartbeat can be seen on ultrasound. At the end of 12 weeks, all the baby's organs will be formed.  Now that you are pregnant, you will want to do everything you can to have a healthy baby. Two of the most important things are to get good prenatal care and to follow your health care provider's instructions. Prenatal care is all the medical care you receive before the baby's birth. This care will help prevent, find, and treat any problems during the pregnancy and childbirth.  Body changes during your first trimester  Your body goes through many changes during pregnancy. The changes vary from woman to woman.   You may gain or lose a couple of pounds at first.   You may feel sick to your stomach (nauseous) and you may throw up (vomit). If the vomiting is uncontrollable, call your health care provider.   You may tire easily.   You may develop headaches that can be relieved by medicines. All medicines should be approved by your health care provider.   You may urinate more often. Painful urination may mean you have a bladder infection.   You may develop heartburn as a result of your pregnancy.   You may develop constipation because certain hormones are causing the muscles that push stool through your intestines to slow down.   You may develop hemorrhoids or swollen veins (varicose veins).   Your breasts may begin to grow larger and become tender. Your nipples may stick out more, and the tissue that surrounds them (areola) may become darker.   Your gums may bleed and may be  sensitive to brushing and flossing.   Dark spots or blotches (chloasma, mask of pregnancy) may develop on your face. This will likely fade after the baby is born.   Your menstrual periods will stop.   You may have a loss of appetite.   You may develop cravings for certain kinds of food.   You may have changes in your emotions from day to day, such as being excited to be pregnant or being concerned that something may go wrong with the pregnancy and baby.   You may have more vivid and strange dreams.   You may have changes in your hair. These can include thickening of your hair, rapid growth, and changes in texture. Some women also have hair loss during or after pregnancy, or hair that feels dry or thin. Your hair will most likely return to normal after your baby is born.  What to expect at prenatal visits  During a routine prenatal visit:   You will be weighed to make sure you and the baby are growing normally.   Your blood pressure will be taken.   Your abdomen will be measured to track your baby's growth.   The fetal heartbeat will be listened to between weeks 10 and 14 of your pregnancy.   Test results from any previous visits will be discussed.  Your health care provider may ask you:     How you are feeling.   If you are feeling the baby move.   If you have had any abnormal symptoms, such as leaking fluid, bleeding, severe headaches, or abdominal cramping.   If you are using any tobacco products, including cigarettes, chewing tobacco, and electronic cigarettes.   If you have any questions.  Other tests that may be performed during your first trimester include:   Blood tests to find your blood type and to check for the presence of any previous infections. The tests will also be used to check for low iron levels (anemia) and protein on red blood cells (Rh antibodies). Depending on your risk factors, or if you previously had diabetes during pregnancy, you may have tests to check for high blood sugar  that affects pregnant women (gestational diabetes).   Urine tests to check for infections, diabetes, or protein in the urine.   An ultrasound to confirm the proper growth and development of the baby.   Fetal screens for spinal cord problems (spina bifida) and Down syndrome.   HIV (human immunodeficiency virus) testing. Routine prenatal testing includes screening for HIV, unless you choose not to have this test.   You may need other tests to make sure you and the baby are doing well.  Follow these instructions at home:  Medicines   Follow your health care provider's instructions regarding medicine use. Specific medicines may be either safe or unsafe to take during pregnancy.   Take a prenatal vitamin that contains at least 600 micrograms (mcg) of folic acid.   If you develop constipation, try taking a stool softener if your health care provider approves.  Eating and drinking     Eat a balanced diet that includes fresh fruits and vegetables, whole grains, good sources of protein such as meat, eggs, or tofu, and low-fat dairy. Your health care provider will help you determine the amount of weight gain that is right for you.   Avoid raw meat and uncooked cheese. These carry germs that can cause birth defects in the baby.   Eating four or five small meals rather than three large meals a day may help relieve nausea and vomiting. If you start to feel nauseous, eating a few soda crackers can be helpful. Drinking liquids between meals, instead of during meals, also seems to help ease nausea and vomiting.   Limit foods that are high in fat and processed sugars, such as fried and sweet foods.   To prevent constipation:  ? Eat foods that are high in fiber, such as fresh fruits and vegetables, whole grains, and beans.  ? Drink enough fluid to keep your urine clear or pale yellow.  Activity   Exercise only as directed by your health care provider. Most women can continue their usual exercise routine during  pregnancy. Try to exercise for 30 minutes at least 5 days a week. Exercising will help you:  ? Control your weight.  ? Stay in shape.  ? Be prepared for labor and delivery.   Experiencing pain or cramping in the lower abdomen or lower back is a good sign that you should stop exercising. Check with your health care provider before continuing with normal exercises.   Try to avoid standing for long periods of time. Move your legs often if you must stand in one place for a long time.   Avoid heavy lifting.   Wear low-heeled shoes and practice good posture.   You may continue to have sex unless your health care   provider tells you not to.  Relieving pain and discomfort   Wear a good support bra to relieve breast tenderness.   Take warm sitz baths to soothe any pain or discomfort caused by hemorrhoids. Use hemorrhoid cream if your health care provider approves.   Rest with your legs elevated if you have leg cramps or low back pain.   If you develop varicose veins in your legs, wear support hose. Elevate your feet for 15 minutes, 3-4 times a day. Limit salt in your diet.  Prenatal care   Schedule your prenatal visits by the twelfth week of pregnancy. They are usually scheduled monthly at first, then more often in the last 2 months before delivery.   Write down your questions. Take them to your prenatal visits.   Keep all your prenatal visits as told by your health care provider. This is important.  Safety   Wear your seat belt at all times when driving.   Make a list of emergency phone numbers, including numbers for family, friends, the hospital, and police and fire departments.  General instructions   Ask your health care provider for a referral to a local prenatal education class. Begin classes no later than the beginning of month 6 of your pregnancy.   Ask for help if you have counseling or nutritional needs during pregnancy. Your health care provider can offer advice or refer you to specialists for help  with various needs.   Do not use hot tubs, steam rooms, or saunas.   Do not douche or use tampons or scented sanitary pads.   Do not cross your legs for long periods of time.   Avoid cat litter boxes and soil used by cats. These carry germs that can cause birth defects in the baby and possibly loss of the fetus by miscarriage or stillbirth.   Avoid all smoking, herbs, alcohol, and medicines not prescribed by your health care provider. Chemicals in these products affect the formation and growth of the baby.   Do not use any products that contain nicotine or tobacco, such as cigarettes and e-cigarettes. If you need help quitting, ask your health care provider. You may receive counseling support and other resources to help you quit.   Schedule a dentist appointment. At home, brush your teeth with a soft toothbrush and be gentle when you floss.  Contact a health care provider if:   You have dizziness.   You have mild pelvic cramps, pelvic pressure, or nagging pain in the abdominal area.   You have persistent nausea, vomiting, or diarrhea.   You have a bad smelling vaginal discharge.   You have pain when you urinate.   You notice increased swelling in your face, hands, legs, or ankles.   You are exposed to fifth disease or chickenpox.   You are exposed to German measles (rubella) and have never had it.  Get help right away if:   You have a fever.   You are leaking fluid from your vagina.   You have spotting or bleeding from your vagina.   You have severe abdominal cramping or pain.   You have rapid weight gain or loss.   You vomit blood or material that looks like coffee grounds.   You develop a severe headache.   You have shortness of breath.   You have any kind of trauma, such as from a fall or a car accident.  Summary   The first trimester of pregnancy is from week 1 until   the end of week 13 (months 1 through 3).   Your body goes through many changes during pregnancy. The changes vary from  woman to woman.   You will have routine prenatal visits. During those visits, your health care provider will examine you, discuss any test results you may have, and talk with you about how you are feeling.  This information is not intended to replace advice given to you by your health care provider. Make sure you discuss any questions you have with your health care provider.  Document Released: 05/08/2001 Document Revised: 04/25/2016 Document Reviewed: 04/25/2016  [employer] Interactive Patient Education  2019 [employer]

## 2018-06-17 NOTE — MAU Provider Note (Signed)
History     CSN: [id number]  Arrival date and time: 06/17/18 1017   First Provider Initiated Contact with Patient 06/17/18 1104      Chief Complaint  Patient presents with  . Vaginal Bleeding  . Pelvic Pain   Samantha Stanton is a 23 y.o. G3P1011 at [redacted]w[redacted]d who presents for Vaginal Bleeding and Pelvic Pain.  She reports having pelvic pain that started yesterday and has been a constant aching pain.  Patient reports the pain is mild, but had concern when she had some bright red blood with wiping this morning.  She has not had an incident of bleeding since this morning and denies sexual activity in the last 48 hours.  She denies vaginal concerns including discharge, itching, odor, and burning.  She also denies issues with urination, constipation, and diarrhea as well as N/V. The patient reports that she has not established care yet.     OB History    Gravida  2   Para  1   Term  1   Preterm      AB      Living  1     SAB      TAB      Ectopic      Multiple      Live Births              Past Medical History:  Diagnosis Date  . Medical history non-contributory     Past Surgical History:  Procedure Laterality Date  . WISDOM TOOTH EXTRACTION      History reviewed. No pertinent family history.  Social History   Tobacco Use  . Smoking status: Never Smoker  . Smokeless tobacco: Never Used  Substance Use Topics  . Alcohol use: No  . Drug use: No    Allergies:  Allergies  Allergen Reactions  . Aspirin Other (See Comments)    unknown    Medications Prior to Admission  Medication Sig Dispense Refill Last Dose  . cephALEXin (KEFLEX) 500 MG capsule Take 1 capsule (500 mg total) by mouth 2 (two) times daily. 14 capsule 0   . naproxen sodium (ANAPROX DS) 550 MG tablet Take 1 tablet (550 mg total) by mouth 2 (two) times daily with a meal. 15 tablet 0   . Prenatal Vit-Fe Fumarate-FA (PRENATAL MULTIVITAMIN) TABS tablet Take 1 tablet by mouth daily at 12 noon.    12/16/2016 at Unknown time  . promethazine (PHENERGAN) 25 MG tablet Take 1 tablet (25 mg total) by mouth every 6 (six) hours as needed for nausea or vomiting. 30 tablet 0   . traMADol (ULTRAM) 50 MG tablet Take 1 tablet (50 mg total) by mouth every 6 (six) hours as needed. 15 tablet 0     Review of Systems  Constitutional: Negative for chills and fever.  Gastrointestinal: Negative for abdominal pain, constipation, diarrhea, nausea and vomiting.  Genitourinary: Positive for vaginal bleeding. Negative for dysuria, pelvic pain and vaginal discharge.     Physical Exam   Blood pressure 120/83, pulse 83, temperature 98.6 F (37 C), temperature source Oral, resp. rate 17, weight 112 kg, SpO2 100 %.  Physical Exam  Constitutional: She is oriented to person, place, and time. She appears well-developed and well-nourished. No distress.  HENT:  Head: Normocephalic and atraumatic.  Eyes: Conjunctivae are normal.  Neck: Normal range of motion.  Cardiovascular: Normal rate, regular rhythm and normal heart sounds.  Respiratory: Effort normal and breath sounds normal.  GI: Soft. Bowel sounds  are normal. She exhibits no distension. There is no abdominal tenderness.  Medium Panus  Genitourinary: Uterus is enlarged (Difficult to assess size d/t body habitus). Cervix exhibits discharge. Cervix exhibits no motion tenderness and no friability.    No vaginal discharge, tenderness or bleeding.  No tenderness or bleeding in the vagina.    Genitourinary Comments: Speculum Exam: -Vaginal Vault: Pink mucosa, No apparent discharge in vault -wet prep collected from posterior fornix -Cervix:Pink, no lesions, cysts, or polyps.  Appears closed. No active bleeding, but moderate amt thick white discharge  from os-GC/CT collected -Bimanual Exam: Closed/Long/Thick   Neurological: She is alert and oriented to person, place, and time.  Skin: Skin is warm and dry.  Psychiatric: She has a normal mood and affect. Her behavior  is normal.    MAU Course  Procedures Results for orders placed or performed during the hospital encounter of 06/17/18 (from the past 24 hour(s))  Urinalysis, Routine w reflex microscopic     Status: Abnormal   Collection Time: 06/17/18 10:37 AM  Result Value Ref Range   Color, Urine YELLOW YELLOW   APPearance CLOUDY (A) CLEAR   Specific Gravity, Urine 1.015 1.005 - 1.030   pH 7.0 5.0 - 8.0   Glucose, UA NEGATIVE NEGATIVE mg/dL   Hgb urine dipstick NEGATIVE NEGATIVE   Bilirubin Urine NEGATIVE NEGATIVE   Ketones, ur NEGATIVE NEGATIVE mg/dL   Protein, ur NEGATIVE NEGATIVE mg/dL   Nitrite NEGATIVE NEGATIVE   Leukocytes, UA SMALL (A) NEGATIVE   RBC / HPF 0-5 0 - 5 RBC/hpf   WBC, UA 6-10 0 - 5 WBC/hpf   Bacteria, UA RARE (A) NONE SEEN   Squamous Epithelial / LPF 0-5 0 - 5   Mucus PRESENT    Budding Yeast PRESENT   Wet prep, genital     Status: Abnormal   Collection Time: 06/17/18 11:14 AM  Result Value Ref Range   Yeast Wet Prep HPF POC PRESENT (A) NONE SEEN   Trich, Wet Prep NONE SEEN NONE SEEN   Clue Cells Wet Prep HPF POC NONE SEEN NONE SEEN   WBC, Wet Prep HPF POC MANY (A) NONE SEEN   Sperm NONE SEEN    US Findings: Intrauterine gestational sac: Single  Yolk sac:  Not Visualized.  Embryo:  Visualized.  Cardiac Activity: Visualized.  Heart Rate: 157 bpm  CRL:   58.9 mm   12 w 2 d                  [place] EDC: 12/28/2018  Subchorionic hemorrhage:  Small subchronic hemorrhage.  Maternal uterus/adnexae: Bilateral ovaries are not discretely visualized.  No free fluid.  IMPRESSION: Single live intrauterine gestation, with estimated gestational age [redacted] weeks 2 days by crown-rump length, as above.  MDM Pelvic Exam with cultures Labs: UA, Wet prep, and GC/CT OB US  Assessment and Plan  IUP at 12.5wks  Vaginal Bleeding  -Unable to obtain doppler. -Exam findings discussed. -Labs Pending -Will send for [place] and reassess once results return.  Follow Up (1:58  PM) Vaginal Candidiasis  -Wet prep returns positive for yeast. -Results discussed with patient. -Rx for Terazol 7 (0.4%) sent to pharmacy on file -Discussed modifications in diet (reduced sugar intake) to promote a decrease in yeast infections as patient self reports that this is a regular occurrence.  -GC/CT Pending -Discussed US findings and SCH -Educated on [facility] and anticipated bleeding. -Bleeding Precautions given -Encouraged to call or return to MAU if symptoms worsen or with  the onset of new symptoms. -Given information sheets for obstetrical providers in the community. -Discharged to home in stable condition  Cherre RobinsJessica L Rosaly Labarbera MSN, CNM 06/17/2018, 11:04 AM

## 2018-06-17 NOTE — MAU Note (Signed)
Been having pelvic pain, started about 6 last night.  This morning when she went to the bathroom and saw blood. Still having the pain.

## 2018-06-19 LAB — GC/CHLAMYDIA PROBE AMP (~~LOC~~) NOT AT ARMC
Chlamydia: NEGATIVE
Neisseria Gonorrhea: NEGATIVE

## 2018-07-25 ENCOUNTER — Ambulatory Visit (INDEPENDENT_AMBULATORY_CARE_PROVIDER_SITE_OTHER): Payer: Self-pay | Admitting: *Deleted

## 2018-07-25 ENCOUNTER — Other Ambulatory Visit (HOSPITAL_COMMUNITY)
Admission: RE | Admit: 2018-07-25 | Discharge: 2018-07-25 | Disposition: A | Discharge status: Home / Self Care | Payer: 59 | Source: Ambulatory Visit | Attending: Family Medicine | Admitting: Family Medicine

## 2018-07-25 ENCOUNTER — Other Ambulatory Visit: Payer: Self-pay

## 2018-07-25 ENCOUNTER — Encounter: Payer: Self-pay | Admitting: *Deleted

## 2018-07-25 ENCOUNTER — Ambulatory Visit: Payer: Self-pay | Admitting: Clinical

## 2018-07-25 VITALS — BP 119/73 | HR 87 | Wt 249.0 lb

## 2018-07-25 DIAGNOSIS — O0990 Supervision of high risk pregnancy, unspecified, unspecified trimester: Secondary | ICD-10-CM

## 2018-07-25 DIAGNOSIS — Z8759 Personal history of other complications of pregnancy, childbirth and the puerperium: Secondary | ICD-10-CM

## 2018-07-25 DIAGNOSIS — Z3A18 18 weeks gestation of pregnancy: Secondary | ICD-10-CM

## 2018-07-25 DIAGNOSIS — O0992 Supervision of high risk pregnancy, unspecified, second trimester: Secondary | ICD-10-CM

## 2018-07-25 DIAGNOSIS — O0930 Supervision of pregnancy with insufficient antenatal care, unspecified trimester: Secondary | ICD-10-CM | POA: Insufficient documentation

## 2018-07-25 LAB — POCT URINALYSIS DIP (DEVICE)
BILIRUBIN URINE: NEGATIVE
Glucose, UA: NEGATIVE mg/dL
HGB URINE DIPSTICK: NEGATIVE
KETONES UR: NEGATIVE mg/dL
NITRITE: NEGATIVE
Protein, ur: NEGATIVE mg/dL
SPECIFIC GRAVITY, URINE: 1.02 (ref 1.005–1.030)
UROBILINOGEN UA: 0.2 mg/dL (ref 0.0–1.0)
pH: 7 (ref 5.0–8.0)

## 2018-07-25 NOTE — Progress Notes (Signed)
I have reviewed this chart and agree with the RN/CMA assessment and management.    K. Meryl Zury Fazzino, M.D. Attending [facility] (Faculty Practice)   

## 2018-07-25 NOTE — BH Specialist Note (Signed)
Integrated Behavioral Health Initial Visit  MRN: [id number] Name: Samantha Stanton  Number of Integrated Behavioral Health Clinician visits:: 1/6 Session Start time: 9:32  Session End time: 9:39 Total time: 15 minutes  Type of Service: Integrated Behavioral Health- Individual/Family Interpretor:No. Interpretor Name and Language: n/a   Warm Hand Off Completed.       SUBJECTIVE: Samantha Stanton is a 23 y.o. female accompanied by n/a Patient was referred by [place] Muta Day, RN for Initial OB introduction to integrated behavioral health services . Patient reports the following symptoms/concerns: Pt states a low appetite, attributed to early pregnancy, but no concern about it today.  Duration of problem: Current pregnancy; Severity of problem: mild  OBJECTIVE: Mood: Normal and Affect: Appropriate Risk of harm to self or others: No plan to harm self or others  LIFE CONTEXT: Family and Social: Pt has almost 22yo School/Work: Pt works full-time Self-Care: - Life Changes: Current pregnancy  GOALS ADDRESSED: Patient will: 1. Increase knowledge and/or ability of: healthy habits   INTERVENTIONS: Interventions utilized: Psychoeducation and/or Health Education  Standardized Assessments completed: GAD-7 and PHQ 9  ASSESSMENT: Patient currently experiencing Supervision of high risk pregnancy, antepartum.   Patient may benefit from Initial OB introduction to integrated behavioral health services .  PLAN: 1. Follow up with behavioral health clinician on : As needed 2. Behavioral recommendations:  -Continue taking prenatal vitamin, as recommended by medical provider 3. Referral(s): Integrated [employer] (In Clinic) 4. "From scale of 1-10, how likely are you to follow plan?": 10  Samantha Lips, LCSW  Depression screen [facility] 2/9 07/25/2018  Decreased Interest 1  Down, Depressed, Hopeless 0  PHQ - 2 Score 1  Altered sleeping 1  Tired, decreased energy 3  Change in appetite  3  Feeling bad or failure about yourself  0  Trouble concentrating 0  Moving slowly or fidgety/restless 1  Suicidal thoughts 0  PHQ-9 Score 9   GAD 7 : Generalized Anxiety Score 07/25/2018  Nervous, Anxious, on Edge 0  Control/stop worrying 1  Worry too much - different things 1  Trouble relaxing 1  Restless 0  Easily annoyed or irritable 1  Afraid - awful might happen 0  Total GAD 7 Score 4

## 2018-07-25 NOTE — Progress Notes (Signed)
New Ob intake completed and pregnancy information packet given. Labs drawn, [place] for anatomy scheduled and Medicaid Home form completed. Pt had no c/o today.

## 2018-07-27 LAB — URINE CULTURE, OB REFLEX: Organism ID, Bacteria: NO GROWTH

## 2018-07-27 LAB — CULTURE, OB URINE

## 2018-07-28 LAB — GC/CHLAMYDIA PROBE AMP (~~LOC~~) NOT AT ARMC
Chlamydia: NEGATIVE
Neisseria Gonorrhea: NEGATIVE

## 2018-08-04 ENCOUNTER — Ambulatory Visit (HOSPITAL_COMMUNITY)
Admission: RE | Admit: 2018-08-04 | Discharge: 2018-08-04 | Disposition: A | Discharge status: Home / Self Care | Payer: 59 | Source: Ambulatory Visit | Attending: Obstetrics and Gynecology | Admitting: Obstetrics and Gynecology

## 2018-08-04 ENCOUNTER — Ambulatory Visit (HOSPITAL_COMMUNITY): Payer: 59 | Admitting: *Deleted

## 2018-08-04 ENCOUNTER — Encounter (HOSPITAL_COMMUNITY): Payer: Self-pay | Admitting: *Deleted

## 2018-08-04 ENCOUNTER — Other Ambulatory Visit: Payer: Self-pay | Admitting: Obstetrics and Gynecology

## 2018-08-04 ENCOUNTER — Other Ambulatory Visit (HOSPITAL_COMMUNITY): Payer: Self-pay | Admitting: *Deleted

## 2018-08-04 DIAGNOSIS — O0990 Supervision of high risk pregnancy, unspecified, unspecified trimester: Secondary | ICD-10-CM

## 2018-08-04 DIAGNOSIS — O0992 Supervision of high risk pregnancy, unspecified, second trimester: Secondary | ICD-10-CM | POA: Insufficient documentation

## 2018-08-04 DIAGNOSIS — Z6841 Body Mass Index (BMI) 40.0 and over, adult: Secondary | ICD-10-CM

## 2018-08-04 DIAGNOSIS — O0930 Supervision of pregnancy with insufficient antenatal care, unspecified trimester: Secondary | ICD-10-CM

## 2018-08-04 DIAGNOSIS — Z363 Encounter for antenatal screening for malformations: Secondary | ICD-10-CM

## 2018-08-04 DIAGNOSIS — E669 Obesity, unspecified: Secondary | ICD-10-CM | POA: Insufficient documentation

## 2018-08-04 DIAGNOSIS — Z8759 Personal history of other complications of pregnancy, childbirth and the puerperium: Secondary | ICD-10-CM | POA: Insufficient documentation

## 2018-08-04 DIAGNOSIS — O0932 Supervision of pregnancy with insufficient antenatal care, second trimester: Secondary | ICD-10-CM | POA: Diagnosis not present

## 2018-08-04 DIAGNOSIS — O99212 Obesity complicating pregnancy, second trimester: Secondary | ICD-10-CM | POA: Diagnosis not present

## 2018-08-04 DIAGNOSIS — Z3A19 19 weeks gestation of pregnancy: Secondary | ICD-10-CM | POA: Diagnosis not present

## 2018-08-04 LAB — HEMOGLOBINOPATHY EVALUATION
Ferritin: 31 ng/mL (ref 15–150)
HGB A: 98.2 % (ref 96.4–98.8)
HGB F QUANT: 0 % (ref 0.0–2.0)
HGB S: 0 %
HGB SOLUBILITY: NEGATIVE
HGB VARIANT: 0 %
Hgb A2 Quant: 1.8 % (ref 1.8–3.2)
Hgb C: 0 %

## 2018-08-04 LAB — OBSTETRIC PANEL, INCLUDING HIV
ANTIBODY SCREEN: NEGATIVE
BASOS: 0 %
Basophils Absolute: 0 10*3/uL (ref 0.0–0.2)
EOS (ABSOLUTE): 0.1 10*3/uL (ref 0.0–0.4)
EOS: 1 %
HEMATOCRIT: 34.7 % (ref 34.0–46.6)
HEMOGLOBIN: 12.1 g/dL (ref 11.1–15.9)
HIV SCREEN 4TH GENERATION: NONREACTIVE
Hepatitis B Surface Ag: NEGATIVE
Immature Grans (Abs): 0 10*3/uL (ref 0.0–0.1)
Immature Granulocytes: 0 %
LYMPHS ABS: 1.6 10*3/uL (ref 0.7–3.1)
Lymphs: 27 %
MCH: 28.9 pg (ref 26.6–33.0)
MCHC: 34.9 g/dL (ref 31.5–35.7)
MCV: 83 fL (ref 79–97)
MONOS ABS: 0.3 10*3/uL (ref 0.1–0.9)
Monocytes: 5 %
NEUTROS ABS: 4 10*3/uL (ref 1.4–7.0)
NEUTROS PCT: 67 %
Platelets: 285 10*3/uL (ref 150–450)
RBC: 4.19 x10E6/uL (ref 3.77–5.28)
RDW: 12.5 % (ref 11.7–15.4)
RH TYPE: POSITIVE
RPR: NONREACTIVE
Rubella Antibodies, IGG: 3.81 index (ref >0.99)
WBC: 6.1 10*3/uL (ref 3.4–10.8)

## 2018-08-04 LAB — INHERITEST(R) CF/SMA PANEL

## 2018-08-08 ENCOUNTER — Ambulatory Visit (INDEPENDENT_AMBULATORY_CARE_PROVIDER_SITE_OTHER): Payer: 59 | Admitting: Obstetrics and Gynecology

## 2018-08-08 ENCOUNTER — Other Ambulatory Visit: Payer: Self-pay

## 2018-08-08 ENCOUNTER — Encounter: Payer: Self-pay | Admitting: Obstetrics and Gynecology

## 2018-08-08 ENCOUNTER — Other Ambulatory Visit (HOSPITAL_COMMUNITY)
Admission: RE | Admit: 2018-08-08 | Discharge: 2018-08-08 | Disposition: A | Discharge status: Home / Self Care | Payer: 59 | Source: Ambulatory Visit | Attending: Obstetrics and Gynecology | Admitting: Obstetrics and Gynecology

## 2018-08-08 VITALS — BP 114/70 | HR 91 | Wt 254.0 lb

## 2018-08-08 DIAGNOSIS — O0990 Supervision of high risk pregnancy, unspecified, unspecified trimester: Secondary | ICD-10-CM | POA: Diagnosis present

## 2018-08-08 DIAGNOSIS — O09292 Supervision of pregnancy with other poor reproductive or obstetric history, second trimester: Secondary | ICD-10-CM

## 2018-08-08 DIAGNOSIS — O09299 Supervision of pregnancy with other poor reproductive or obstetric history, unspecified trimester: Secondary | ICD-10-CM

## 2018-08-08 DIAGNOSIS — Z3A19 19 weeks gestation of pregnancy: Secondary | ICD-10-CM

## 2018-08-08 DIAGNOSIS — O0932 Supervision of pregnancy with insufficient antenatal care, second trimester: Secondary | ICD-10-CM

## 2018-08-08 DIAGNOSIS — Z8759 Personal history of other complications of pregnancy, childbirth and the puerperium: Secondary | ICD-10-CM

## 2018-08-08 DIAGNOSIS — O0930 Supervision of pregnancy with insufficient antenatal care, unspecified trimester: Secondary | ICD-10-CM

## 2018-08-08 NOTE — Progress Notes (Signed)
INITIAL PRENATAL VISIT NOTE  Subjective:  Samantha Stanton is a 23 y.o. G3P1011 at [redacted]w[redacted]d by 1st trim [place] being seen today for her initial prenatal visit. This is a planned pregnancy. She and partner are happy with the pregnancy. She has an obstetric history significant for SVD x1. She has a medical history significant for gestational hypertension.  Patient reports feeling "heavy" and vaginal pressure.  Contractions: Not present. Vag. Bleeding: None.  Movement: Present. Denies leaking of fluid.   She is not working because [employer] has decided to make pregnant workers stay home, still being paid.  Past Medical History:  Diagnosis Date  . Bleeding disorder (HCC)    Cannot take ASA  . Hypertension    Gestational HTN    Past Surgical History:  Procedure Laterality Date  . WISDOM TOOTH EXTRACTION      OB History  Gravida Para Term Preterm AB Living  3 1 1   1 1   SAB TAB Ectopic Multiple Live Births  1       1    # Outcome Date GA Lbr Len/2nd Weight Sex Delivery Anes PTL Lv  3 Current           2 SAB 04/17/17 [redacted]w[redacted]d    SAB     1 Term 08/25/13   7 lb 11 oz (3.487 kg) M Vag-Spont EPI N LIV     Complications: Gestational hypertension    Social History   Socioeconomic History  . Marital status: Single    Spouse name: Not on file  . Number of children: Not on file  . Years of education: Not on file  . Highest education level: Not on file  Occupational History  . 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 Financial resource strain: Not on file  . Food insecurity:    Worry: Never true    Inability: Never true  . Transportation needs:    Medical: No    Non-medical: No  Tobacco Use  . Smoking status: Never Smoker  . Smokeless tobacco: Never Used  Substance and Sexual Activity  . Alcohol use: Not Currently  . Drug use: No  . Sexual activity: Yes  Lifestyle  . Physical activity:    Days per week: Not on file    Minutes per session: Not on file  .  Stress: Not on file  Relationships  . Social connections:    Talks on phone: Not on file    Gets together: Not on file    Attends religious service: Not on file    Active member of club or organization: Not on file    Attends meetings of clubs or organizations: Not on file    Relationship status: Not on file  Other Topics Concern  . Not on file  Social History Narrative  . Not on file    Family History  Problem Relation Age of Onset  . Diabetes Mother   . Hypertension Mother   . Cancer Sister        Leukemia    Current Outpatient Medications:  .  Prenatal Vit-Fe Fumarate-FA (PRENATAL MULTIVITAMIN) TABS tablet, Take 1 tablet by mouth daily at 12 noon., Disp: , Rfl:   Allergies  Allergen Reactions  . Aspirin Other (See Comments)    Causes nose bleeds and frequent periods - age 43. Saw hematologist and told not to take ASA   Review of Systems: Negative except for what is mentioned in HPI.  Objective:  Vitals:   08/08/18 0849  BP: 114/70  Pulse: 91  Weight: 254 lb (115.2 kg)    Fetal Status: Fetal Heart Rate (bpm): 144   Movement: Present     Physical Exam: BP 114/70   Pulse 91   Wt 254 lb (115.2 kg)   LMP 03/03/2018 (Approximate) Comment: does not know her LMP  BMI 41.00 kg/m  CONSTITUTIONAL: Well-developed, well-nourished female in no acute distress.  NEUROLOGIC: Alert and oriented to person, place, and time. Normal reflexes, muscle tone coordination. No cranial nerve deficit noted. PSYCHIATRIC: Normal mood and affect. Normal behavior. Normal judgment and thought content. SKIN: Skin is warm and dry. No rash noted. Not diaphoretic. No erythema. No pallor. HENT:  Normocephalic, atraumatic, External right and left ear normal. Oropharynx is clear and moist EYES: Conjunctivae and EOM are normal. Pupils are equal, round, and reactive to light. No scleral icterus.  NECK: Normal range of motion, supple, no masses CARDIOVASCULAR: Normal heart rate noted, regular rhythm  RESPIRATORY: Effort and breath sounds normal, no problems with respiration noted BREASTS: dferred ABDOMEN: Soft, nontender, nondistended, gravid. GU: normal appearing external female genitalia, multiparous normal appearing cervix, scant white discharge in vagina, no lesions noted MUSCULOSKELETAL: Normal range of motion. EXT:  No edema and no tenderness. 2+ distal pulses.   Assessment and Plan:  Pregnancy: G3P1011 at [redacted]w[redacted]d by 1st trim [place]  1. Supervision of high risk pregnancy, antepartum Panorama Counseled regarding risks/benefits of flu vaccine, patient declines vaccine.   2. History of gestational hypertension - Cannot take aspirin, previously seen at [facility] hematology for same - per records, she has abnormal platelet aggregation  3. Late prenatal care, antepartum NOB @ 19 w   Preterm labor symptoms and general obstetric precautions including but not limited to vaginal bleeding, contractions, leaking of fluid and fetal movement were reviewed in detail with the patient.  Please refer to After Visit Summary for other counseling recommendations.   Return in about 4 weeks (around 09/05/2018) for OB visit.  Conan Bowens 08/08/2018 9:25 AM

## 2018-08-08 NOTE — Addendum Note (Signed)
Addended by: Ernestina Patches on: 08/08/2018 09:29 AM   Modules accepted: Orders

## 2018-08-12 ENCOUNTER — Encounter: Payer: Self-pay | Admitting: *Deleted

## 2018-08-12 LAB — CYTOLOGY - PAP
CHLAMYDIA, DNA PROBE: NEGATIVE
DIAGNOSIS: NEGATIVE
Neisseria Gonorrhea: NEGATIVE
Trichomonas: NEGATIVE

## 2018-08-19 ENCOUNTER — Encounter: Payer: Self-pay | Admitting: *Deleted

## 2018-08-28 ENCOUNTER — Ambulatory Visit (INDEPENDENT_AMBULATORY_CARE_PROVIDER_SITE_OTHER): Payer: 59 | Admitting: Obstetrics and Gynecology

## 2018-08-28 DIAGNOSIS — O0932 Supervision of pregnancy with insufficient antenatal care, second trimester: Secondary | ICD-10-CM

## 2018-08-28 DIAGNOSIS — O0930 Supervision of pregnancy with insufficient antenatal care, unspecified trimester: Secondary | ICD-10-CM

## 2018-08-28 DIAGNOSIS — Z3A22 22 weeks gestation of pregnancy: Secondary | ICD-10-CM

## 2018-08-28 DIAGNOSIS — O0992 Supervision of high risk pregnancy, unspecified, second trimester: Secondary | ICD-10-CM

## 2018-08-28 DIAGNOSIS — Z8759 Personal history of other complications of pregnancy, childbirth and the puerperium: Secondary | ICD-10-CM

## 2018-08-28 DIAGNOSIS — O09292 Supervision of pregnancy with other poor reproductive or obstetric history, second trimester: Secondary | ICD-10-CM

## 2018-08-28 DIAGNOSIS — O0990 Supervision of high risk pregnancy, unspecified, unspecified trimester: Secondary | ICD-10-CM

## 2018-08-28 NOTE — Progress Notes (Signed)
I connected with  Copelyn Salley on 08/28/18 at  3:15 PM EDT by telephone and verified that I am speaking with the correct person using two identifiers.   I discussed the limitations, risks, security and privacy concerns of performing an evaluation and management service by telephone and the availability of in person appointments. I also discussed with the patient that there may be a patient responsible charge related to this service. The patient expressed understanding and agreed to proceed.  Ernestina Patches, CMA 08/28/2018  3:41 PM   112/80 at [facility] on Monday.   Will stop by tomorrow for BP Cuff and Baby Rx information

## 2018-08-28 NOTE — Progress Notes (Signed)
   PRENATAL VISIT NOTE  Subjective:  Samantha Stanton is a 23 y.o. G3P1011 at [redacted]w[redacted]d being seen today for ongoing prenatal care.  She is currently monitored for the following issues for this High risk  pregnancy and has Supervision of high risk pregnancy, antepartum; History of gestational hypertension; and Late prenatal care, antepartum on their problem list.  Patient reports no complaints.  Contractions: Not present. Vag. Bleeding: None.  Movement: Present. Denies leaking of fluid.   The following portions of the patient's history were reviewed and updated as appropriate: allergies, current medications, past family history, past medical history, past social history, past surgical history and problem list.   Objective:   Vitals:   08/28/18 1544  BP: 112/80    Fetal Status:     Movement: Present     General:  Alert, oriented and cooperative. Patient is in no acute distress.  Skin: Skin is warm and dry. No rash noted.   Cardiovascular: Normal heart rate noted  Respiratory: Normal respiratory effort, no problems with respiration noted  Abdomen: Soft, gravid, appropriate for gestational age.  Pain/Pressure: Present     Pelvic: Cervical exam deferred        Extremities: Normal range of motion.     Mental Status: Normal mood and affect. Normal behavior. Normal judgment and thought content.   Assessment and Plan:  Pregnancy: G3P1011 at [redacted]w[redacted]d  1. Supervision of high risk pregnancy, antepartum  - CH[facility] AMB BABYSCRIPTS SCHEDULE OPTIMIZATION - Next visit in office for 2 hour GTT  2. History of gestational hypertension  BP checked at home 112/80 Planning to pick up a cuff tomorrow for BP management in the office.   3. Late prenatal care, antepartum   Preterm labor symptoms and general obstetric precautions including but not limited to vaginal bleeding, contractions, leaking of fluid and fetal movement were reviewed in detail with the patient. Please refer to After Visit Summary for other  counseling recommendations.   Return in about 4 weeks (around 09/25/2018) for For 2 hour GTT visit, she needs in office visit . MD visit .  Future Appointments  Date Time Provider Department Center  10/27/2018  2:50 PM [facility] NURSE [facility]  10/27/2018  3:00 PM [facility] [place] 3 [facility]    Venia Carbon, NP

## 2018-09-02 ENCOUNTER — Encounter: Payer: Self-pay | Admitting: *Deleted

## 2018-09-15 ENCOUNTER — Telehealth: Payer: Self-pay | Admitting: General Practice

## 2018-09-15 NOTE — Telephone Encounter (Signed)
Called patient regarding no blood pressures entered into babys[employer]s. Patient states it was asking about how to pair her device and she wasn't sure what to do. Patient pulled up app while on the phone and was able to enter BP manually. Patient had no questions.

## 2018-09-16 ENCOUNTER — Other Ambulatory Visit: Payer: Self-pay | Admitting: *Deleted

## 2018-09-16 DIAGNOSIS — B373 Candidiasis of vulva and vagina: Secondary | ICD-10-CM

## 2018-09-16 DIAGNOSIS — B3731 Acute candidiasis of vulva and vagina: Secondary | ICD-10-CM

## 2018-09-16 DIAGNOSIS — O98819 Other maternal infectious and parasitic diseases complicating pregnancy, unspecified trimester: Principal | ICD-10-CM

## 2018-09-16 MED ORDER — TERCONAZOLE 0.4 % VA CREA: 1.0000 | TOPICAL_CREAM | Freq: Every day | VAGINAL | 0 refills | Status: DC

## 2018-09-16 NOTE — Progress Notes (Signed)
Pt sent mychart message reporting vaginal itching and irritation and requesting terconazole cream.  Pt reports she frequently gets yeast infections and this feels the same.  Order for Terconazole Cream placed and sent to the [facility] on [address].

## 2018-09-16 NOTE — Addendum Note (Signed)
Addended by: Osvaldo Human on: 09/16/2018 02:52 PM   Modules accepted: Orders

## 2018-09-24 ENCOUNTER — Encounter: Payer: Self-pay | Admitting: Obstetrics and Gynecology

## 2018-09-24 ENCOUNTER — Other Ambulatory Visit: Payer: Self-pay

## 2018-10-03 ENCOUNTER — Ambulatory Visit (INDEPENDENT_AMBULATORY_CARE_PROVIDER_SITE_OTHER): Payer: 59 | Admitting: Obstetrics and Gynecology

## 2018-10-03 ENCOUNTER — Other Ambulatory Visit: Payer: 59

## 2018-10-03 ENCOUNTER — Other Ambulatory Visit: Payer: Self-pay

## 2018-10-03 ENCOUNTER — Other Ambulatory Visit: Payer: Self-pay | Admitting: *Deleted

## 2018-10-03 VITALS — BP 132/84 | HR 97 | Temp 98.2°F | Wt 259.5 lb

## 2018-10-03 DIAGNOSIS — Z23 Encounter for immunization: Secondary | ICD-10-CM

## 2018-10-03 DIAGNOSIS — Z8759 Personal history of other complications of pregnancy, childbirth and the puerperium: Secondary | ICD-10-CM

## 2018-10-03 DIAGNOSIS — O0990 Supervision of high risk pregnancy, unspecified, unspecified trimester: Secondary | ICD-10-CM

## 2018-10-03 DIAGNOSIS — O99210 Obesity complicating pregnancy, unspecified trimester: Secondary | ICD-10-CM

## 2018-10-03 DIAGNOSIS — Z3A27 27 weeks gestation of pregnancy: Secondary | ICD-10-CM

## 2018-10-03 NOTE — Progress Notes (Signed)
Prenatal Visit Note Date: 10/03/2018 Clinic: [facility]  Subjective:  Samantha Stanton is a 23 y.o. G3P1011 at [redacted]w[redacted]d being seen today for ongoing prenatal care.  She is currently monitored for the following issues for this low-risk pregnancy and has Supervision of high risk pregnancy, antepartum; History of gestational hypertension; and Late prenatal care, antepartum on their problem list.  Patient reports no complaints.   Contractions: Not present. Vag. Bleeding: None.  Movement: Present. Denies leaking of fluid.   The following portions of the patient's history were reviewed and updated as appropriate: allergies, current medications, past family history, past medical history, past social history, past surgical history and problem list. Problem list updated.  Objective:   Vitals:   10/03/18 0837  BP: 132/84  Pulse: 97  Temp: 98.2 F (36.8 C)  Weight: 259 lb 7.7 oz (117.7 kg)    Fetal Status: Fetal Heart Rate (bpm): 152 Fundal Height: 28 cm Movement: Present     General:  Alert, oriented and cooperative. Patient is in no acute distress.  Skin: Skin is warm and dry. No rash noted.   Cardiovascular: Normal heart rate noted  Respiratory: Normal respiratory effort, no problems with respiration noted  Abdomen: Soft, gravid, appropriate for gestational age. Pain/Pressure: Present     Pelvic:  Cervical exam deferred        Extremities: Normal range of motion.  Edema: None  Mental Status: Normal mood and affect. Normal behavior. Normal judgment and thought content.   Urinalysis:      Assessment and Plan:  Pregnancy: G3P1011 at [redacted]w[redacted]d  1. Supervision of high risk pregnancy, antepartum Routine care. 28wk labs today - Tdap vaccine greater than or equal to 7yo IM  Preterm labor symptoms and general obstetric precautions including but not limited to vaginal bleeding, contractions, leaking of fluid and fetal movement were reviewed in detail with the patient. Please  refer to After Visit Summary for other counseling recommendations.  Return in about 2 weeks (around 10/17/2018) for 2-3wk rob virtual.   Lolita Bing, MD

## 2018-10-04 LAB — CBC
Hematocrit: 33.6 % — ABNORMAL LOW (ref 34.0–46.6)
Hemoglobin: 11.8 g/dL (ref 11.1–15.9)
MCH: 29.1 pg (ref 26.6–33.0)
MCHC: 35.1 g/dL (ref 31.5–35.7)
MCV: 83 fL (ref 79–97)
Platelets: 227 10*3/uL (ref 150–450)
RBC: 4.05 x10E6/uL (ref 3.77–5.28)
RDW: 12.5 % (ref 11.7–15.4)
WBC: 7.3 10*3/uL (ref 3.4–10.8)

## 2018-10-04 LAB — GLUCOSE TOLERANCE, 2 HOURS W/ 1HR
Glucose, 1 hour: 105 mg/dL (ref 65–179)
Glucose, 2 hour: 135 mg/dL (ref 65–152)
Glucose, Fasting: 88 mg/dL (ref 65–91)

## 2018-10-04 LAB — HIV ANTIBODY (ROUTINE TESTING W REFLEX): HIV Screen 4th Generation wRfx: NONREACTIVE

## 2018-10-04 LAB — RPR: RPR Ser Ql: NONREACTIVE

## 2018-10-06 ENCOUNTER — Telehealth: Payer: Self-pay | Admitting: Lactation Services

## 2018-10-06 NOTE — Telephone Encounter (Signed)
Attempted to call pt per her request on My Chart. Call went to voicemail immediately. LM for pt to call the office at [phone] at her convenience.

## 2018-10-06 NOTE — Telephone Encounter (Signed)
Pt called and left message on nurse voicemail. She stated she is having contractions that hurt really bad.   Attempted to call pt at 11:26, phone went straight to voicemail. Left message for pt to call the office back.   Sent mychart message for pt to go to MAU ASAP.

## 2018-10-08 DIAGNOSIS — O99210 Obesity complicating pregnancy, unspecified trimester: Secondary | ICD-10-CM | POA: Insufficient documentation

## 2018-10-24 ENCOUNTER — Ambulatory Visit (INDEPENDENT_AMBULATORY_CARE_PROVIDER_SITE_OTHER): Payer: 59 | Admitting: Obstetrics & Gynecology

## 2018-10-24 ENCOUNTER — Other Ambulatory Visit: Payer: Self-pay

## 2018-10-24 VITALS — BP 128/80

## 2018-10-24 DIAGNOSIS — Z3A30 30 weeks gestation of pregnancy: Secondary | ICD-10-CM

## 2018-10-24 DIAGNOSIS — O0933 Supervision of pregnancy with insufficient antenatal care, third trimester: Secondary | ICD-10-CM

## 2018-10-24 DIAGNOSIS — O99210 Obesity complicating pregnancy, unspecified trimester: Secondary | ICD-10-CM

## 2018-10-24 DIAGNOSIS — O99213 Obesity complicating pregnancy, third trimester: Secondary | ICD-10-CM

## 2018-10-24 DIAGNOSIS — O0990 Supervision of high risk pregnancy, unspecified, unspecified trimester: Secondary | ICD-10-CM

## 2018-10-24 DIAGNOSIS — O0930 Supervision of pregnancy with insufficient antenatal care, unspecified trimester: Secondary | ICD-10-CM

## 2018-10-24 DIAGNOSIS — Z8759 Personal history of other complications of pregnancy, childbirth and the puerperium: Secondary | ICD-10-CM

## 2018-10-24 NOTE — Progress Notes (Signed)
I connected with  Samantha Stanton on 10/24/18 at 10:35 AM EDT by telephone and verified that I am speaking with the correct person using two identifiers.   I discussed the limitations, risks, security and privacy concerns of performing an evaluation and management service by telephone and the availability of in person appointments. I also discussed with the patient that there may be a patient responsible charge related to this service. The patient expressed understanding and agreed to proceed.  Samantha Stanton, CMA 10/24/2018  10:42 AM

## 2018-10-24 NOTE — Progress Notes (Signed)
   TELEHEALTH OBSTETRICS PRENATAL VIRTUAL VIDEO VISIT ENCOUNTER NOTE  Provider location: [facility] [employer] at [facility]   I connected with Lillard Anes on 10/24/18 at 10:35 AM EDT by WebEx Video Encounter at home and verified that I am speaking with the correct person using two identifiers.   I discussed the limitations, risks, security and privacy concerns of performing an evaluation and management service by telephone and the availability of in person appointments. I also discussed with the patient that there may be a patient responsible charge related to this service. The patient expressed understanding and agreed to proceed. Subjective:  Samantha Stanton is a 23 y.o. G3P1011 at [redacted]w[redacted]d being seen today for ongoing prenatal care.  She is currently monitored for the following issues for this low-risk pregnancy and has Supervision of high risk pregnancy, antepartum; History of gestational hypertension; Late prenatal care, antepartum; and Obesity in pregnancy on their problem list.  Patient reports no complaints.   .  .   . Denies any leaking of fluid.   The following portions of the patient's history were reviewed and updated as appropriate: allergies, current medications, past family history, past medical history, past social history, past surgical history and problem list.   Objective:  There were no vitals filed for this visit.  Fetal Status:           General:  Alert, oriented and cooperative. Patient is in no acute distress.  Respiratory: Normal respiratory effort, no problems with respiration noted  Mental Status: Normal mood and affect. Normal behavior. Normal judgment and thought content.  Rest of physical exam deferred due to type of encounter  Imaging: No results found.  Assessment and Plan:  Pregnancy: G3P1011 at [redacted]w[redacted]d 1. Obesity in pregnancy - encouraged healthy lifestyle (she walks the stairs daily)  2. Supervision of high risk pregnancy, antepartum - MFM u/s  next week - rec that she get weighed at the time of her MFM u/s next week  3. Late prenatal care, antepartum   4. History of gestational hypertension  Preterm labor symptoms and general obstetric precautions including but not limited to vaginal bleeding, contractions, leaking of fluid and fetal movement were reviewed in detail with the patient. I discussed the assessment and treatment plan with the patient. The patient was provided an opportunity to ask questions and all were answered. The patient agreed with the plan and demonstrated an understanding of the instructions. The patient was advised to call back or seek an in-person office evaluation/go to MAU at [facility] for any urgent or concerning symptoms. Please refer to After Visit Summary for other counseling recommendations.   I provided 10 minutes of face-to-face time during this encounter.  No follow-ups on file.  Future Appointments  Date Time Provider Department Center  10/27/2018  2:50 PM WH-MFC NURSE WH-MFC MFC-US  10/27/2018  3:00 PM WH-MFC [place] 3 WH-MFCUS MFC-US    Allie Bossier, MD [facility] [employer], [facility]

## 2018-10-27 ENCOUNTER — Other Ambulatory Visit: Payer: Self-pay

## 2018-10-27 ENCOUNTER — Ambulatory Visit (HOSPITAL_COMMUNITY)
Admission: RE | Admit: 2018-10-27 | Discharge: 2018-10-27 | Disposition: A | Discharge status: Home / Self Care | Payer: 59 | Source: Ambulatory Visit | Attending: Obstetrics and Gynecology | Admitting: Obstetrics and Gynecology

## 2018-10-27 ENCOUNTER — Ambulatory Visit (HOSPITAL_COMMUNITY): Payer: 59 | Admitting: *Deleted

## 2018-10-27 ENCOUNTER — Encounter (HOSPITAL_COMMUNITY): Payer: Self-pay

## 2018-10-27 VITALS — BP 138/68 | HR 95 | Temp 98.4°F

## 2018-10-27 DIAGNOSIS — Z6841 Body Mass Index (BMI) 40.0 and over, adult: Secondary | ICD-10-CM | POA: Insufficient documentation

## 2018-10-27 DIAGNOSIS — O0932 Supervision of pregnancy with insufficient antenatal care, second trimester: Secondary | ICD-10-CM

## 2018-10-27 DIAGNOSIS — O99212 Obesity complicating pregnancy, second trimester: Secondary | ICD-10-CM

## 2018-10-27 DIAGNOSIS — O0990 Supervision of high risk pregnancy, unspecified, unspecified trimester: Secondary | ICD-10-CM

## 2018-10-27 DIAGNOSIS — O99210 Obesity complicating pregnancy, unspecified trimester: Secondary | ICD-10-CM

## 2018-10-27 DIAGNOSIS — Z362 Encounter for other antenatal screening follow-up: Secondary | ICD-10-CM | POA: Diagnosis not present

## 2018-10-27 DIAGNOSIS — O139 Gestational [pregnancy-induced] hypertension without significant proteinuria, unspecified trimester: Secondary | ICD-10-CM | POA: Diagnosis present

## 2018-10-27 DIAGNOSIS — O0930 Supervision of pregnancy with insufficient antenatal care, unspecified trimester: Secondary | ICD-10-CM | POA: Diagnosis present

## 2018-10-27 DIAGNOSIS — Z8759 Personal history of other complications of pregnancy, childbirth and the puerperium: Secondary | ICD-10-CM | POA: Diagnosis present

## 2018-10-27 DIAGNOSIS — Z3A31 31 weeks gestation of pregnancy: Secondary | ICD-10-CM

## 2018-11-07 ENCOUNTER — Telehealth: Payer: Self-pay | Admitting: Obstetrics & Gynecology

## 2018-11-07 NOTE — Telephone Encounter (Signed)
Attempted to call patient about her appointment, and how she needed to have her MyChart app downloaded in order to have this visit. A voicemail was left. °

## 2018-11-10 ENCOUNTER — Other Ambulatory Visit: Payer: Self-pay

## 2018-11-10 ENCOUNTER — Telehealth: Payer: 59 | Admitting: Obstetrics & Gynecology

## 2018-11-10 ENCOUNTER — Encounter: Payer: Self-pay | Admitting: Obstetrics & Gynecology

## 2018-11-10 ENCOUNTER — Telehealth: Payer: Self-pay | Admitting: Obstetrics & Gynecology

## 2018-11-10 DIAGNOSIS — Z8759 Personal history of other complications of pregnancy, childbirth and the puerperium: Secondary | ICD-10-CM

## 2018-11-10 DIAGNOSIS — O99210 Obesity complicating pregnancy, unspecified trimester: Secondary | ICD-10-CM

## 2018-11-10 DIAGNOSIS — O0990 Supervision of high risk pregnancy, unspecified, unspecified trimester: Secondary | ICD-10-CM

## 2018-11-10 DIAGNOSIS — O0930 Supervision of pregnancy with insufficient antenatal care, unspecified trimester: Secondary | ICD-10-CM

## 2018-11-10 NOTE — Progress Notes (Unsigned)
I connected with  Samantha Stanton on 11/10/18 at  9:35 AM EDT by telephone and verified that I am speaking with the correct person using two identifiers.   I discussed the limitations, risks, security and privacy concerns of performing an evaluation and management service by telephone and the availability of in person appointments. I also discussed with the patient that there may be a patient responsible charge related to this service. The patient expressed understanding and agreed to proceed.  Dolores Hoose, RN 11/10/2018  9:38 AM

## 2018-11-10 NOTE — Patient Instructions (Signed)

## 2018-11-10 NOTE — Telephone Encounter (Signed)
Attempted to call patient to get her scheduled for her next OB appointment ( 7/6 @ 2:15- office). No answer, left appointment information on voicemail. Patient was instructed that this will be an office visit. Appointment reminder mailed

## 2018-11-10 NOTE — Progress Notes (Unsigned)
   TELEHEALTH VIRTUAL OBSTETRICS VISIT ENCOUNTER NOTE  I connected with Samantha Stanton on 11/10/18 at  9:35 AM EDT by telephone at home and verified that I am speaking with the correct person using two identifiers.   I discussed the limitations, risks, security and privacy concerns of performing an evaluation and management service by telephone and the availability of in person appointments. I also discussed with the patient that there may be a patient responsible charge related to this service. The patient expressed understanding and agreed to proceed.  Subjective:  Samantha Stanton is a 23 y.o. G3P1011 at [redacted]w[redacted]d being followed for ongoing prenatal care.  She is currently monitored for the following issues for this high-risk pregnancy and has Supervision of high risk pregnancy, antepartum; History of gestational hypertension; Late prenatal care, antepartum; and Obesity in pregnancy on their problem list.  Patient reports pressure. Reports fetal movement. Denies any contractions, bleeding or leaking of fluid.   The following portions of the patient's history were reviewed and updated as appropriate: allergies, current medications, past family history, past medical history, past social history, past surgical history and problem list.   Objective:   General:  Alert, oriented and cooperative.   Mental Status: Normal mood and affect perceived. Normal judgment and thought content.  Rest of physical exam deferred due to type of encounter  Assessment and Plan:  Pregnancy: G3P1011 at [redacted]w[redacted]d 1. Obesity in pregnancy   2. Supervision of high risk pregnancy, antepartum Doing well  3. History of gestational hypertension No HTN  4. Late prenatal care, antepartum   Term labor symptoms and general obstetric precautions including but not limited to vaginal bleeding, contractions, leaking of fluid and fetal movement were reviewed in detail with the patient.  I discussed the assessment and treatment plan  with the patient. The patient was provided an opportunity to ask questions and all were answered. The patient agreed with the plan and demonstrated an understanding of the instructions. The patient was advised to call back or seek an in-person office evaluation/go to MAU at [facility] for any urgent or concerning symptoms. Please refer to After Visit Summary for other counseling recommendations.   I provided 12 minutes of non-face-to-face time during this encounter.  Return in about 3 weeks (around 12/01/2018) for GBS .  No future appointments.  Emeterio Reeve, MD [facility], [place]

## 2018-11-11 ENCOUNTER — Inpatient Hospital Stay (HOSPITAL_COMMUNITY)
Admission: AD | Admit: 2018-11-11 | Discharge: 2018-11-11 | Disposition: A | Discharge status: Home / Self Care | Payer: 59 | Attending: Obstetrics and Gynecology | Admitting: Obstetrics and Gynecology

## 2018-11-11 ENCOUNTER — Encounter (HOSPITAL_COMMUNITY): Payer: Self-pay

## 2018-11-11 ENCOUNTER — Other Ambulatory Visit: Payer: Self-pay

## 2018-11-11 DIAGNOSIS — N920 Excessive and frequent menstruation with regular cycle: Secondary | ICD-10-CM | POA: Insufficient documentation

## 2018-11-11 DIAGNOSIS — O4693 Antepartum hemorrhage, unspecified, third trimester: Secondary | ICD-10-CM

## 2018-11-11 DIAGNOSIS — F909 Attention-deficit hyperactivity disorder, unspecified type: Secondary | ICD-10-CM | POA: Insufficient documentation

## 2018-11-11 DIAGNOSIS — Z3A33 33 weeks gestation of pregnancy: Secondary | ICD-10-CM | POA: Diagnosis not present

## 2018-11-11 DIAGNOSIS — O368130 Decreased fetal movements, third trimester, not applicable or unspecified: Secondary | ICD-10-CM

## 2018-11-11 HISTORY — DX: Gestational (pregnancy-induced) hypertension without significant proteinuria, unspecified trimester: O13.9

## 2018-11-11 HISTORY — DX: Excessive and frequent menstruation with regular cycle: N92.0

## 2018-11-11 LAB — URINALYSIS, ROUTINE W REFLEX MICROSCOPIC
Bilirubin Urine: NEGATIVE
Glucose, UA: NEGATIVE mg/dL
Hgb urine dipstick: NEGATIVE
Ketones, ur: NEGATIVE mg/dL
Nitrite: NEGATIVE
Protein, ur: NEGATIVE mg/dL
Specific Gravity, Urine: 1.011 (ref 1.005–1.030)
pH: 7 (ref 5.0–8.0)

## 2018-11-11 LAB — WET PREP, GENITAL
Clue Cells Wet Prep HPF POC: NONE SEEN
Sperm: NONE SEEN
Trich, Wet Prep: NONE SEEN
WBC, Wet Prep HPF POC: NONE SEEN
Yeast Wet Prep HPF POC: NONE SEEN

## 2018-11-11 NOTE — MAU Note (Signed)
Pt states she noted bright red blood when using the restroom approx 1hr ago.  Has not noted any clots & states this is the 1st time she has noted bleeding.  Denies pain or other LOF.  States DFM; has not noted FM since 0500.  Denies any placental issues.

## 2018-11-11 NOTE — Discharge Instructions (Signed)
Pelvic rest  Do not have sex, an orgasm, or use sexual stimulation.  Do not use tampons. Do not douche. Do not put anything into your vagina.  Do not lift anything that is heavier than 10 lb (4.5 kg).  Avoid activities that require a lot of effort.  Avoid any activity in which your pelvic muscles could become strained, such as squatting.   Vaginal Bleeding During Pregnancy, Third Trimester  A small amount of bleeding from the vagina (spotting) is relatively common during pregnancy. Various things can cause bleeding or spotting during pregnancy. Sometimes bleeding is normal and is not a problem. However, bleeding during the third trimester can also be a sign of something serious for the mother and the baby. Be sure to tell your health care provider about any vaginal bleeding right away. Some possible causes of vaginal bleeding during the third trimester include:  Infection or growths (polyps) on the cervix.  A condition in which the placenta partially or completely covers the opening of the cervix inside the uterus (placenta previa).  The placenta separating from the uterus (placenta abruption).  The start of labor (discharging of the mucus plug).  A condition in which the placenta grows into the muscle layer of the uterus (placenta accreta). Follow these instructions at home: Activity  Follow instructions from your health care provider about limiting your activity. If your health care provider recommends activity restriction, you may need to stay in bed and only get up to use the bathroom. In some cases, your health care provider may allow you to continue light activity.  If needed, make plans for someone to help with your regular activities.  Ask your health care provider if it is safe for you to drive.  Do not lift anything that is heavier than 10 lb (4.5 kg), or the limit that your health care provider tells you, until he or she says that it is safe.  Do not have sex or  orgasms until your health care provider says that this is safe. Medicines  Take over-the-counter and prescription medicines only as told by your health care provider.  Do not take aspirin because it can cause bleeding. General instructions  Pay attention to any changes in your symptoms.  Write down how many pads you use each day, how often you change pads, and how soaked (saturated) they are.  Do not use tampons or douche.  If you pass any tissue from your vagina, save the tissue so you can show it to your health care provider.  Keep all follow-up visits as told by your health care provider. This is important. Contact a health care provider if:  You have vaginal bleeding during any part of your pregnancy.  You have cramps or labor pains.  You have a fever. Get help right away if:  You have severe cramps or pain in your back or abdomen.  You have a gush of fluid from the vagina.  You pass large clots or a large amount of tissue from your vagina.  Your bleeding increases.  You feel light-headed or weak.  You faint.  You feel that your baby is moving less than usual, or not moving at all. Summary  Various things can cause bleeding or spotting in pregnancy.  Bleeding during the third trimester can be a sign of a serious problem for the mother and the baby.  Be sure to tell your health care provider about any vaginal bleeding right away. This information is not intended to  replace advice given to you by your health care provider. Make sure you discuss any questions you have with your health care provider. Document Released: 08/04/2002 Document Revised: 08/16/2016 Document Reviewed: 08/16/2016 Elsevier Interactive Patient Education  2019 [employer]

## 2018-11-11 NOTE — MAU Provider Note (Signed)
Chief Complaint:  Vaginal Bleeding   First Provider Initiated Contact with Patient 11/11/18 1421     HPI: Samantha Stanton is a 23 y.o. G3P1011 at 2355w2d who presents to maternity admissions reporting small amount of bright red bleeding this afternoon.  No other episodes of bleeding this pregnancy.  States she was diagnosed with a bleeding disorder as a teenager but does not know much about it.  Upon review of chart it was a platelet aggregation disorder that resolved.  Placenta anterior per anatomy US.  Denies any abd pain, recent IC, trauma  contractions, leakage of fluid or vaginal discharge. Had decreased fetal mvmt today, but Good fetal movement since arrival to MAU.   Pregnancy Course:  Patient Active Problem List   Diagnosis Date Noted  . ADHD (attention deficit hyperactivity disorder) 11/11/2018  . Menorrhagia 11/11/2018  . Obesity in pregnancy 10/08/2018  . Supervision of high risk pregnancy, antepartum 07/25/2018  . History of gestational hypertension 07/25/2018  . Late prenatal care, antepartum 07/25/2018  . 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 Bleeding disorder (HCC)    Cannot take ASA  . Chlamydia infection affecting pregnancy 2013  . Hypertension    Gestational HTN  . Pregnancy induced hypertension    with last pregnancy   OB History  Gravida Para Term Preterm AB Living  3 1 1   1 1   SAB TAB Ectopic Multiple Live Births  1       1    # Outcome Date GA Lbr Len/2nd Weight Sex Delivery Anes PTL Lv  3 Current           2 SAB 04/17/17 6026w0d    SAB     1 Term 08/25/13 6668w5d  3487 g M Vag-Spont EPI N LIV     Complications: Gestational hypertension   Past Surgical History:  Procedure Laterality Date  . WISDOM TOOTH EXTRACTION     Family History  Problem Relation Age of Onset  . Diabetes Mother   . Hypertension Mother   . Cancer Sister        Leukemia   Social History   Tobacco Use  . Smoking status:  Never Smoker  . Smokeless tobacco: Never Used  Substance Use Topics  . Alcohol use: Not Currently  . Drug use: No   Allergies  Allergen Reactions  . Aspirin Other (See Comments)    Causes nose bleeds and frequent periods - age 23. Saw hematologist and told not to take ASA   Medications Prior to Admission  Medication Sig Dispense Refill Last Dose  . Prenatal Vit-Fe Fumarate-FA (PRENATAL MULTIVITAMIN) TABS tablet Take 1 tablet by mouth daily at 12 noon.     Marland Kitchen. terconazole (TERAZOL 7) 0.4 % vaginal cream Place 1 applicator vaginally at bedtime. 45 g 0     I have reviewed patient's Past Medical Hx, Surgical Hx, Family Hx, Social Hx, medications and allergies.   ROS:  Review of Systems  Constitutional: Negative for chills and fever.  Gastrointestinal: Negative for abdominal pain and anal bleeding.  Genitourinary: Positive for vaginal bleeding. Negative for dysuria, hematuria and vaginal discharge.    Physical Exam   Patient Vitals for the past 24 hrs:  BP Temp Temp src Pulse Resp SpO2  11/11/18 1551 133/69 99.2 F (37.3 C) Oral 88 18 -  11/11/18 1404 - - - - - 98 %  11/11/18 1402 132/66 98.9 F (37.2 C) Oral 96 18 -  Constitutional: Well-developed, well-nourished female in no acute distress.  Cardiovascular: normal rate Respiratory: normal effort GI: Abd soft, non-tender, gravid appropriate for gestational age.  Neurologic: Alert and oriented x 4.  GU: Neg CVAT.  Pelvic: NEFG, physiologic discharge, no blood, cervix clean. No CMT  Dilation: Closed Effacement (%): Thick Cervical Position: Posterior Station: Ballotable Presentation: Undeterminable Exam by:: Manya Silvas, CNM  FHT:  Baseline 140 , moderate variability, accelerations present, no decelerations Contractions: None   Labs: Results for orders placed or performed during the hospital encounter of 11/11/18 (from the past 24 hour(s))  Urinalysis, Routine w reflex microscopic     Status: Abnormal   Collection  Time: 11/11/18  2:10 PM  Result Value Ref Range   Color, Urine YELLOW YELLOW   APPearance HAZY (A) CLEAR   Specific Gravity, Urine 1.011 1.005 - 1.030   pH 7.0 5.0 - 8.0   Glucose, UA NEGATIVE NEGATIVE mg/dL   Hgb urine dipstick NEGATIVE NEGATIVE   Bilirubin Urine NEGATIVE NEGATIVE   Ketones, ur NEGATIVE NEGATIVE mg/dL   Protein, ur NEGATIVE NEGATIVE mg/dL   Nitrite NEGATIVE NEGATIVE   Leukocytes,Ua MODERATE (A) NEGATIVE   RBC / HPF 0-5 0 - 5 RBC/hpf   WBC, UA 6-10 0 - 5 WBC/hpf   Bacteria, UA FEW (A) NONE SEEN   Squamous Epithelial / LPF 11-20 0 - 5   Mucus PRESENT   Wet prep, genital     Status: None   Collection Time: 11/11/18  2:38 PM   Specimen: Cervical/Vaginal swab; Genital  Result Value Ref Range   Yeast Wet Prep HPF POC NONE SEEN NONE SEEN   Trich, Wet Prep NONE SEEN NONE SEEN   Clue Cells Wet Prep HPF POC NONE SEEN NONE SEEN   WBC, Wet Prep HPF POC NONE SEEN NONE SEEN   Sperm NONE SEEN     Imaging:  NA  MAU Course: Orders Placed This Encounter  Procedures  . Wet prep, genital  . Urinalysis, Routine w reflex microscopic  . Discharge patient   MDM: - Reported vaginal bleeding although none seen on exam today.  Cervix long and closed.  Patient denies hematuria or rectal bleeding.  No previa per previous ultrasound.  Unsure of the source of bleeding that she saw, but no evidence of pregnancy emergency. -Decreased fetal movement, resolved.  Fetal heart rate reactive.  Assessment: 1. Vaginal bleeding in pregnancy, third trimester     Plan: Discharge home in stable condition.  Preterm labor precautions and fetal kick counts Bleeding precautions. Pelvic rest x1 week. Follow-up [place] for [facility] Follow up.   Specialty: Obstetrics and Gynecology Why: As scheduled or sooner as needed if symptoms worsen Contact information: [address], [place] [id number] [place]  [id number] [facility] Follow up.   Specialty: Obstetrics and Gynecology Why: As needed in pregnancy emergencies Contact information: [address] [id number] [place] [phone]          Allergies as of 11/11/2018      Reactions   Aspirin Other (See Comments)   Causes nose bleeds and frequent periods - age 20. Saw hematologist and told not to take ASA      Medication List    TAKE these medications   prenatal multivitamin Tabs tablet Take 1 tablet by mouth daily at 12 noon.   terconazole 0.4 % vaginal cream Commonly known  as: TERAZOL 7 Place 1 applicator vaginally at bedtime.       Katrinka BlazingSmith, [place], [place] 11/11/2018 3:54 PM

## 2018-11-12 ENCOUNTER — Telehealth: Payer: Self-pay | Admitting: *Deleted

## 2018-11-12 LAB — GC/CHLAMYDIA PROBE AMP (~~LOC~~) NOT AT ARMC
Chlamydia: NEGATIVE
Neisseria Gonorrhea: NEGATIVE

## 2018-11-12 NOTE — Telephone Encounter (Signed)
Attempted to call pt per her request via mychart message.  Call went straight to voicemail.  Left message advising pt that she was being contacted regarding her mychart message and requesting she contact the clinic if she continues to have questions or concerns.  Will reply via mychart.

## 2018-11-18 ENCOUNTER — Telehealth (INDEPENDENT_AMBULATORY_CARE_PROVIDER_SITE_OTHER): Payer: 59 | Admitting: *Deleted

## 2018-11-18 ENCOUNTER — Telehealth: Payer: Self-pay | Admitting: Family Medicine

## 2018-11-18 DIAGNOSIS — O0990 Supervision of high risk pregnancy, unspecified, unspecified trimester: Secondary | ICD-10-CM

## 2018-11-18 NOTE — Telephone Encounter (Signed)
Called patient and left a message on her mailbox to give [place] a call so we could give her some information about her appointment. We need to make sure she has Webex for her visit with Roselyn Reef.

## 2018-11-18 NOTE — Telephone Encounter (Signed)
Called pt regarding babyscripts.  Per review, pt last logged BP on 5/12.  Requested pt take and log a blood pressure which she stated she would do as soon as she got into the house.  Pt would also like to speak with Roselyn Reef with [place] regarding the pt's anxiety.  Message sent to [facility].

## 2018-11-19 NOTE — BH Specialist Note (Signed)
Pt did not arrive to Webex video and did not answer the phone; Left HIPPA-compliant message to call back Roselyn Reef from Center for [employer] at [phone].   Integrated Behavioral Health Visit via [facility] video  11/19/2018 Samantha Stanton [phone]   [place]

## 2018-11-20 ENCOUNTER — Ambulatory Visit: Payer: 59 | Admitting: Clinical

## 2018-11-27 ENCOUNTER — Telehealth: Payer: Self-pay | Admitting: Obstetrics and Gynecology

## 2018-11-27 NOTE — Telephone Encounter (Signed)
Called the patient to complete the pre-screen. The patient answered no to COVID19 symptoms and/or being previously diagnosed. Informed the patient of the wearing a face mask, sanitizing hands at the sanitizing station upon entering our office, and no visitors or children are allowed due to the COVID19 restrictions. The patient verbalized understanding. °

## 2018-12-01 ENCOUNTER — Other Ambulatory Visit: Payer: Self-pay

## 2018-12-01 ENCOUNTER — Encounter: Payer: Self-pay | Admitting: Obstetrics & Gynecology

## 2018-12-01 ENCOUNTER — Ambulatory Visit (INDEPENDENT_AMBULATORY_CARE_PROVIDER_SITE_OTHER): Payer: 59 | Admitting: Obstetrics & Gynecology

## 2018-12-01 ENCOUNTER — Other Ambulatory Visit (HOSPITAL_COMMUNITY)
Admission: RE | Admit: 2018-12-01 | Discharge: 2018-12-01 | Disposition: A | Discharge status: Home / Self Care | Payer: 59 | Source: Ambulatory Visit | Attending: Obstetrics & Gynecology | Admitting: Obstetrics & Gynecology

## 2018-12-01 VITALS — BP 138/76 | HR 79 | Wt 260.0 lb

## 2018-12-01 DIAGNOSIS — Z3A36 36 weeks gestation of pregnancy: Secondary | ICD-10-CM

## 2018-12-01 DIAGNOSIS — O0990 Supervision of high risk pregnancy, unspecified, unspecified trimester: Secondary | ICD-10-CM | POA: Insufficient documentation

## 2018-12-01 DIAGNOSIS — O0993 Supervision of high risk pregnancy, unspecified, third trimester: Secondary | ICD-10-CM

## 2018-12-01 DIAGNOSIS — Z8759 Personal history of other complications of pregnancy, childbirth and the puerperium: Secondary | ICD-10-CM

## 2018-12-01 NOTE — Progress Notes (Signed)
   PRENATAL VISIT NOTE  Subjective:  Samantha Stanton is a 23 y.o. G3P1011 at [redacted]w[redacted]d being seen today for ongoing prenatal care.  She is currently monitored for the following issues for this high-risk pregnancy and has Supervision of high risk pregnancy, antepartum; History of gestational hypertension; Late prenatal care, antepartum; Obesity in pregnancy; ADHD (attention deficit hyperactivity disorder); Abnormal platelet aggregation ([place]); and Menorrhagia on their problem list.  Patient reports no complaints.  Contractions: Irregular. Vag. Bleeding: None.  Movement: Present. Denies leaking of fluid.   The following portions of the patient's history were reviewed and updated as appropriate: allergies, current medications, past family history, past medical history, past social history, past surgical history and problem list.   Objective:   Vitals:   12/01/18 1507  BP: 138/76  Pulse: 79  Weight: 260 lb (117.9 kg)    Fetal Status: Fetal Heart Rate (bpm): 156   Movement: Present     General:  Alert, oriented and cooperative. Patient is in no acute distress.  Skin: Skin is warm and dry. No rash noted.   Cardiovascular: Normal heart rate noted  Respiratory: Normal respiratory effort, no problems with respiration noted  Abdomen: Soft, gravid, appropriate for gestational age.  Pain/Pressure: Present     Pelvic: Cervical exam performed        Extremities: Normal range of motion.  Edema: Mild pitting, slight indentation  Mental Status: Normal mood and affect. Normal behavior. Normal judgment and thought content.   Assessment and Plan:  Pregnancy: G3P1011 at [redacted]w[redacted]d 1. Supervision of high risk pregnancy, antepartum Routine testing - Culture, beta strep (group b only) - GC/Chlamydia probe amp ([place])not at [facility]  2. History of gestational hypertension No GHTN currently  Preterm labor symptoms and general obstetric precautions including but not limited to vaginal bleeding, contractions,  leaking of fluid and fetal movement were reviewed in detail with the patient. Please refer to After Visit Summary for other counseling recommendations.   Return in about 1 week (around 12/08/2018) for virtual.  No future appointments.  Emeterio Reeve, MD

## 2018-12-01 NOTE — Patient Instructions (Signed)

## 2018-12-03 LAB — GC/CHLAMYDIA PROBE AMP (~~LOC~~) NOT AT ARMC
Chlamydia: NEGATIVE
Neisseria Gonorrhea: NEGATIVE

## 2018-12-04 ENCOUNTER — Ambulatory Visit (HOSPITAL_COMMUNITY)
Admission: EM | Admit: 2018-12-04 | Discharge: 2018-12-04 | Disposition: A | Discharge status: Home / Self Care | Payer: 59 | Attending: Urgent Care | Admitting: Urgent Care

## 2018-12-04 ENCOUNTER — Other Ambulatory Visit: Payer: Self-pay

## 2018-12-04 ENCOUNTER — Encounter (HOSPITAL_COMMUNITY): Payer: Self-pay

## 2018-12-04 DIAGNOSIS — M79644 Pain in right finger(s): Secondary | ICD-10-CM | POA: Diagnosis not present

## 2018-12-04 DIAGNOSIS — W230XXA Caught, crushed, jammed, or pinched between moving objects, initial encounter: Secondary | ICD-10-CM | POA: Diagnosis not present

## 2018-12-04 DIAGNOSIS — S61111A Laceration without foreign body of right thumb with damage to nail, initial encounter: Secondary | ICD-10-CM | POA: Diagnosis not present

## 2018-12-04 DIAGNOSIS — S6991XA Unspecified injury of right wrist, hand and finger(s), initial encounter: Secondary | ICD-10-CM | POA: Diagnosis not present

## 2018-12-04 MED ORDER — LIDOCAINE HCL 2 % IJ SOLN
INTRAMUSCULAR | Status: AC
  Filled 2018-12-04: qty 20

## 2018-12-04 NOTE — ED Triage Notes (Signed)
Pt presents with complaints of right thumb injury while helping move a washing machine, the door she was holding hit her thumb.

## 2018-12-04 NOTE — Discharge Instructions (Addendum)
You may use Tylenol 500 mg once every 6 hours as needed for pain and inflammation.  Move your dressing in 24 hours using warm soapy water.  After that keep your wound clean, dry and open to the air.  Cover your wound if you cannot be in very unclean environments using your hands.  Return to the clinic if you develop fever, drainage of pus or if the wound opens back up.

## 2018-12-04 NOTE — ED Provider Notes (Signed)
  MRN: [id number] DOB: 06/17/1995  Subjective:   Samantha Stanton is a 23 y.o. female presenting for acute laceration suffered today from accidentally closing the washer door on her hand.  Reports pain about her wound but no loss of range of motion or sensation.  She has kept her wound covered.  She is currently pregnant.  Tdap updated during her last pregnancy about a couple years ago.  No current facility-administered medications for this encounter.   Current Outpatient Medications:  .  Prenatal Vit-Fe Fumarate-FA (PRENATAL MULTIVITAMIN) TABS tablet, Take 1 tablet by mouth daily at 12 noon., Disp: , Rfl:  .  terconazole (TERAZOL 7) 0.4 % vaginal cream, Place 1 applicator vaginally at bedtime., Disp: 45 g, Rfl: 0   Allergies  Allergen Reactions  . Aspirin Other (See Comments)    Causes nose bleeds and frequent periods - age 47. Saw hematologist and told not to take ASA    Past Medical History:  Diagnosis Date  . Bleeding disorder ([place])    Cannot take ASA  . Chlamydia infection affecting pregnancy 2013  . Hypertension    Gestational HTN  . Pregnancy induced hypertension    with last pregnancy     Past Surgical History:  Procedure Laterality Date  . WISDOM TOOTH EXTRACTION      ROS  Objective:   Vitals: BP 128/83   Pulse 98   Temp 99.3 F (37.4 C)   Resp 18   LMP 03/03/2018 (Approximate) Comment: does not know her LMP  SpO2 99%   Physical Exam Constitutional:      General: She is not in acute distress.    Appearance: Normal appearance. She is well-developed. She is not ill-appearing.  HENT:     Head: Normocephalic and atraumatic.     Nose: Nose normal.     Mouth/Throat:     Mouth: Mucous membranes are moist.     Pharynx: Oropharynx is clear.  Eyes:     General: No scleral icterus.    Extraocular Movements: Extraocular movements intact.     Pupils: Pupils are equal, round, and reactive to light.  Cardiovascular:     Rate and Rhythm: Normal rate.  Pulmonary:    Effort: Pulmonary effort is normal.  Musculoskeletal:     Right hand: She exhibits tenderness (About her wound), laceration and swelling. She exhibits normal range of motion, normal capillary refill and no deformity. Normal sensation noted. Normal strength noted.       Hands:  Skin:    General: Skin is warm and dry.  Neurological:     General: No focal deficit present.     Mental Status: She is alert and oriented to person, place, and time.  Psychiatric:        Mood and Affect: Mood normal.        Behavior: Behavior normal.     PROCEDURE NOTE: laceration repair Verbal consent obtained from patient.  Local anesthesia with 3cc Lidocaine 2% without epinephrine.  Wound explored for tendon, ligament damage. Wound scrubbed with soap and water and rinsed. Wound closed with #6 5-0 Prolene (simple interrupted) sutures.  Wound cleansed and dressed.  Assessment and Plan :   1. Laceration of right thumb without foreign body with damage to nail, initial encounter    Laceration repaired successfully. Wound care reviewed. Return-to-clinic precautions discussed, patient verbalized understanding. Otherwise, follow up in 10 days for suture removal.     Jaynee Eagles, PA-C 12/04/18 1720

## 2018-12-05 LAB — CULTURE, BETA STREP (GROUP B ONLY): Strep Gp B Culture: NEGATIVE

## 2018-12-09 ENCOUNTER — Encounter: Payer: Self-pay | Admitting: *Deleted

## 2018-12-10 DIAGNOSIS — Z029 Encounter for administrative examinations, unspecified: Secondary | ICD-10-CM

## 2018-12-11 ENCOUNTER — Telehealth (INDEPENDENT_AMBULATORY_CARE_PROVIDER_SITE_OTHER): Payer: 59 | Admitting: Obstetrics & Gynecology

## 2018-12-11 ENCOUNTER — Other Ambulatory Visit: Payer: Self-pay

## 2018-12-11 DIAGNOSIS — O99213 Obesity complicating pregnancy, third trimester: Secondary | ICD-10-CM

## 2018-12-11 DIAGNOSIS — O0990 Supervision of high risk pregnancy, unspecified, unspecified trimester: Secondary | ICD-10-CM

## 2018-12-11 DIAGNOSIS — O0993 Supervision of high risk pregnancy, unspecified, third trimester: Secondary | ICD-10-CM

## 2018-12-11 DIAGNOSIS — O99210 Obesity complicating pregnancy, unspecified trimester: Secondary | ICD-10-CM

## 2018-12-11 DIAGNOSIS — Z8759 Personal history of other complications of pregnancy, childbirth and the puerperium: Secondary | ICD-10-CM

## 2018-12-11 DIAGNOSIS — O0930 Supervision of pregnancy with insufficient antenatal care, unspecified trimester: Secondary | ICD-10-CM

## 2018-12-11 DIAGNOSIS — Z3A37 37 weeks gestation of pregnancy: Secondary | ICD-10-CM

## 2018-12-11 DIAGNOSIS — O0933 Supervision of pregnancy with insufficient antenatal care, third trimester: Secondary | ICD-10-CM

## 2018-12-11 NOTE — Progress Notes (Signed)
   TELEHEALTH VIRTUAL OBSTETRICS VISIT ENCOUNTER NOTE  I connected with Samantha Stanton on 12/11/18 at  2:15 PM EDT by telephone at home and verified that I am speaking with the correct person using two identifiers.   I discussed the limitations, risks, security and privacy concerns of performing an evaluation and management service by telephone and the availability of in person appointments. I also discussed with the patient that there may be a patient responsible charge related to this service. The patient expressed understanding and agreed to proceed.  Subjective:  Samantha Stanton is a 23 y.o. G3P1011 at [redacted]w[redacted]d being followed for ongoing prenatal care.  She is currently monitored for the following issues for this high-risk pregnancy and has Supervision of high risk pregnancy, antepartum; History of gestational hypertension; Late prenatal care, antepartum; Obesity in pregnancy; ADHD (attention deficit hyperactivity disorder); Abnormal platelet aggregation ([place]); and Menorrhagia on their problem list.  Patient reports no complaints. Reports fetal movement. Denies any contractions, bleeding or leaking of fluid.   The following portions of the patient's history were reviewed and updated as appropriate: allergies, current medications, past family history, past medical history, past social history, past surgical history and problem list.   Objective:   General:  Alert, oriented and cooperative.   Mental Status: Normal mood and affect perceived. Normal judgment and thought content.  Rest of physical exam deferred due to type of encounter  Assessment and Plan:  Pregnancy: G3P1011 at [redacted]w[redacted]d 1. Obesity in pregnancy Doing well  2. Supervision of high risk pregnancy, antepartum   3. History of gestational hypertension BP normal  4. Late prenatal care, antepartum   Term labor symptoms and general obstetric precautions including but not limited to vaginal bleeding, contractions, leaking of fluid and  fetal movement were reviewed in detail with the patient.  I discussed the assessment and treatment plan with the patient. The patient was provided an opportunity to ask questions and all were answered. The patient agreed with the plan and demonstrated an understanding of the instructions. The patient was advised to call back or seek an in-person office evaluation/go to [facility]U at [facility] for any urgent or concerning symptoms. Please refer to After Visit Summary for other counseling recommendations.   I provided 11 minutes of non-face-to-face time during this encounter.  Return in about 1 week (around 12/18/2018) for in person.  Future Appointments  Date Time Provider [place]  12/18/2018 10:55 AM Lavonia Drafts, MD [facility] [place]  12/25/2018  9:15 AM Lavonia Drafts, MD [facility] [place]  12/29/2018 10:15 AM Emily Filbert, MD [facility]  12/29/2018 11:15 AM [facility]ST [facility]    Emeterio Reeve, MD [facility], [place]

## 2018-12-11 NOTE — Progress Notes (Signed)
I connected with  Samantha Stanton on 12/11/18 at  2:15 PM EDT by telephone and verified that I am speaking with the correct person using two identifiers.   I discussed the limitations, risks, security and privacy concerns of performing an evaluation and management service by telephone and the availability of in person appointments. I also discussed with the patient that there may be a patient responsible charge related to this service. The patient expressed understanding and agreed to proceed.  Alric Seton, [place] 12/11/2018  2:29 PM

## 2018-12-11 NOTE — Patient Instructions (Signed)

## 2018-12-12 ENCOUNTER — Encounter (HOSPITAL_COMMUNITY): Payer: Self-pay | Admitting: *Deleted

## 2018-12-12 ENCOUNTER — Other Ambulatory Visit: Payer: Self-pay

## 2018-12-12 ENCOUNTER — Inpatient Hospital Stay (HOSPITAL_COMMUNITY)
Admission: AD | Admit: 2018-12-12 | Discharge: 2018-12-17 | DRG: 807 | Disposition: A | Discharge status: Home / Self Care | Payer: No Typology Code available for payment source | Attending: Family Medicine | Admitting: Family Medicine

## 2018-12-12 DIAGNOSIS — O368130 Decreased fetal movements, third trimester, not applicable or unspecified: Secondary | ICD-10-CM | POA: Diagnosis present

## 2018-12-12 DIAGNOSIS — D691 Qualitative platelet defects: Secondary | ICD-10-CM | POA: Diagnosis present

## 2018-12-12 DIAGNOSIS — F909 Attention-deficit hyperactivity disorder, unspecified type: Secondary | ICD-10-CM | POA: Diagnosis present

## 2018-12-12 DIAGNOSIS — Z1159 Encounter for screening for other viral diseases: Secondary | ICD-10-CM

## 2018-12-12 DIAGNOSIS — O99214 Obesity complicating childbirth: Secondary | ICD-10-CM | POA: Diagnosis present

## 2018-12-12 DIAGNOSIS — O0930 Supervision of pregnancy with insufficient antenatal care, unspecified trimester: Secondary | ICD-10-CM

## 2018-12-12 DIAGNOSIS — O1413 Severe pre-eclampsia, third trimester: Secondary | ICD-10-CM | POA: Diagnosis present

## 2018-12-12 DIAGNOSIS — O1414 Severe pre-eclampsia complicating childbirth: Principal | ICD-10-CM | POA: Diagnosis present

## 2018-12-12 DIAGNOSIS — Z3A37 37 weeks gestation of pregnancy: Secondary | ICD-10-CM | POA: Diagnosis not present

## 2018-12-12 DIAGNOSIS — O99344 Other mental disorders complicating childbirth: Secondary | ICD-10-CM | POA: Diagnosis present

## 2018-12-12 DIAGNOSIS — O0990 Supervision of high risk pregnancy, unspecified, unspecified trimester: Secondary | ICD-10-CM

## 2018-12-12 DIAGNOSIS — Z8759 Personal history of other complications of pregnancy, childbirth and the puerperium: Secondary | ICD-10-CM

## 2018-12-12 DIAGNOSIS — O99210 Obesity complicating pregnancy, unspecified trimester: Secondary | ICD-10-CM | POA: Diagnosis present

## 2018-12-12 LAB — PROTEIN / CREATININE RATIO, URINE
Creatinine, Urine: 143.25 mg/dL
Protein Creatinine Ratio: 0.13 mg/mg{Cre} (ref 0.00–0.15)
Total Protein, Urine: 19 mg/dL

## 2018-12-12 LAB — COMPREHENSIVE METABOLIC PANEL
ALT: 14 U/L (ref 0–44)
AST: 17 U/L (ref 15–41)
Albumin: 3.1 g/dL — ABNORMAL LOW (ref 3.5–5.0)
Alkaline Phosphatase: 79 U/L (ref 38–126)
Anion gap: 10 (ref 5–15)
BUN: <5 mg/dL — ABNORMAL LOW (ref 6–20)
CO2: 21 mmol/L — ABNORMAL LOW (ref 22–32)
Calcium: 9.2 mg/dL (ref 8.9–10.3)
Chloride: 107 mmol/L (ref 98–111)
Creatinine, Ser: 0.88 mg/dL (ref 0.44–1.00)
GFR calc Af Amer: >60 mL/min (ref >60)
GFR calc non Af Amer: >60 mL/min (ref >60)
Glucose, Bld: 81 mg/dL (ref 70–99)
Potassium: 3 mmol/L — ABNORMAL LOW (ref 3.5–5.1)
Sodium: 138 mmol/L (ref 135–145)
Total Bilirubin: 0.4 mg/dL (ref 0.3–1.2)
Total Protein: 7.1 g/dL (ref 6.5–8.1)

## 2018-12-12 LAB — URINALYSIS, ROUTINE W REFLEX MICROSCOPIC
Bacteria, UA: NONE SEEN
Bilirubin Urine: NEGATIVE
Glucose, UA: NEGATIVE mg/dL
Hgb urine dipstick: NEGATIVE
Ketones, ur: NEGATIVE mg/dL
Nitrite: NEGATIVE
Protein, ur: NEGATIVE mg/dL
Specific Gravity, Urine: 1.011 (ref 1.005–1.030)
pH: 7 (ref 5.0–8.0)

## 2018-12-12 LAB — CBC
HCT: 39.8 % (ref 36.0–46.0)
Hemoglobin: 12.9 g/dL (ref 12.0–15.0)
MCH: 28.5 pg (ref 26.0–34.0)
MCHC: 32.4 g/dL (ref 30.0–36.0)
MCV: 87.9 fL (ref 80.0–100.0)
Platelets: 239 10*3/uL (ref 150–400)
RBC: 4.53 MIL/uL (ref 3.87–5.11)
RDW: 14.8 % (ref 11.5–15.5)
WBC: 8.7 10*3/uL (ref 4.0–10.5)
nRBC: 0 % (ref 0.0–0.2)

## 2018-12-12 LAB — TYPE AND SCREEN
ABO/RH(D): A POS
Antibody Screen: NEGATIVE

## 2018-12-12 LAB — SARS CORONAVIRUS 2 BY RT PCR (HOSPITAL ORDER, PERFORMED IN ~~LOC~~ HOSPITAL LAB): SARS Coronavirus 2: NEGATIVE

## 2018-12-12 MED ORDER — MAGNESIUM SULFATE 40 G IN LACTATED RINGERS - SIMPLE
2.0000 g/h | INTRAVENOUS | Status: AC
  Administered 2018-12-12 – 2018-12-14 (×3): 2 g/h via INTRAVENOUS
  Filled 2018-12-12 (×3): qty 500

## 2018-12-12 MED ORDER — OXYCODONE-ACETAMINOPHEN 5-325 MG PO TABS: 2.0000 | ORAL_TABLET | ORAL | Status: DC | PRN

## 2018-12-12 MED ORDER — OXYCODONE-ACETAMINOPHEN 5-325 MG PO TABS: 1.0000 | ORAL_TABLET | ORAL | Status: DC | PRN

## 2018-12-12 MED ORDER — LACTATED RINGERS IV SOLN
500.0000 mL | INTRAVENOUS | Status: DC | PRN
  Administered 2018-12-13: 500 mL via INTRAVENOUS

## 2018-12-12 MED ORDER — MISOPROSTOL 25 MCG QUARTER TABLET
ORAL_TABLET | ORAL | Status: AC
  Administered 2018-12-12: 25 ug via VAGINAL
  Filled 2018-12-12: qty 1

## 2018-12-12 MED ORDER — SOD CITRATE-CITRIC ACID 500-334 MG/5ML PO SOLN: 30.0000 mL | ORAL | Status: DC | PRN

## 2018-12-12 MED ORDER — LABETALOL HCL 5 MG/ML IV SOLN
20.0000 mg | INTRAVENOUS | Status: DC | PRN
  Administered 2018-12-12 – 2018-12-14 (×3): 20 mg via INTRAVENOUS
  Filled 2018-12-12 (×2): qty 4

## 2018-12-12 MED ORDER — HYDRALAZINE HCL 20 MG/ML IJ SOLN: 10.0000 mg | INTRAMUSCULAR | Status: DC | PRN

## 2018-12-12 MED ORDER — LIDOCAINE HCL (PF) 1 % IJ SOLN: 30.0000 mL | INTRAMUSCULAR | Status: DC | PRN

## 2018-12-12 MED ORDER — MISOPROSTOL 25 MCG QUARTER TABLET
25.0000 ug | ORAL_TABLET | ORAL | Status: DC | PRN
  Administered 2018-12-12 – 2018-12-13 (×3): 25 ug via VAGINAL
  Filled 2018-12-12 (×4): qty 1

## 2018-12-12 MED ORDER — LABETALOL HCL 5 MG/ML IV SOLN
INTRAVENOUS | Status: AC
  Filled 2018-12-12: qty 4

## 2018-12-12 MED ORDER — OXYTOCIN BOLUS FROM INFUSION
500.0000 mL | Freq: Once | INTRAVENOUS | Status: AC
  Administered 2018-12-14: 500 mL via INTRAVENOUS

## 2018-12-12 MED ORDER — OXYTOCIN 40 UNITS IN NORMAL SALINE INFUSION - SIMPLE MED
2.5000 [IU]/h | INTRAVENOUS | Status: DC
  Administered 2018-12-14: 2.5 [IU]/h via INTRAVENOUS
  Filled 2018-12-12: qty 1000

## 2018-12-12 MED ORDER — ZOLPIDEM TARTRATE 5 MG PO TABS
5.0000 mg | ORAL_TABLET | Freq: Every evening | ORAL | Status: DC | PRN
  Administered 2018-12-13: 5 mg via ORAL
  Filled 2018-12-12: qty 1

## 2018-12-12 MED ORDER — LABETALOL HCL 5 MG/ML IV SOLN: 40.0000 mg | INTRAVENOUS | Status: DC | PRN

## 2018-12-12 MED ORDER — ACETAMINOPHEN 325 MG PO TABS: 650.0000 mg | ORAL_TABLET | ORAL | Status: DC | PRN

## 2018-12-12 MED ORDER — MAGNESIUM SULFATE BOLUS VIA INFUSION
4.0000 g | Freq: Once | INTRAVENOUS | Status: AC
  Administered 2018-12-12: 4 g via INTRAVENOUS
  Filled 2018-12-12: qty 500

## 2018-12-12 MED ORDER — ONDANSETRON HCL 4 MG/2ML IJ SOLN
4.0000 mg | Freq: Four times a day (QID) | INTRAMUSCULAR | Status: DC | PRN
  Administered 2018-12-13: 4 mg via INTRAVENOUS
  Filled 2018-12-12: qty 2

## 2018-12-12 MED ORDER — LABETALOL HCL 5 MG/ML IV SOLN: 80.0000 mg | INTRAVENOUS | Status: DC | PRN

## 2018-12-12 MED ORDER — LACTATED RINGERS IV SOLN
INTRAVENOUS | Status: DC
  Administered 2018-12-12 – 2018-12-14 (×6): via INTRAVENOUS

## 2018-12-12 MED ORDER — ACETAMINOPHEN 500 MG PO TABS: 1000.0000 mg | ORAL_TABLET | Freq: Once | ORAL | Status: DC

## 2018-12-12 MED ORDER — FENTANYL CITRATE (PF) 100 MCG/2ML IJ SOLN
100.0000 ug | INTRAMUSCULAR | Status: DC | PRN
  Administered 2018-12-13 (×2): 100 ug via INTRAVENOUS
  Filled 2018-12-12 (×2): qty 2

## 2018-12-12 NOTE — Progress Notes (Signed)
LABOR PROGRESS NOTE  Samantha Stanton is a 23 y.o. G3P1011 at [redacted]w[redacted]d  admitted for IOL for preE w/SF  Subjective: She is doing well with minimal discomfort at this time. She reports positive fetal movement. She denies leakage of fluid and vaginal bleeding.  Objective: BP (!) 145/84   Pulse 84   Temp 98.6 F (37 C)   Resp 20   Ht 5\' 5"  (1.651 m)   Wt 119.3 kg   LMP 03/03/2018 (Approximate) Comment: does not know her LMP  BMI 43.77 kg/m  or  Vitals:   12/12/18 2046 12/12/18 2100 12/12/18 2116 12/12/18 2131  BP: (!) 148/85 (!) 147/79 (!) 160/85 (!) 145/84  Pulse: 89 86 87 84  Resp:      Temp:      Weight:      Height:        Dilation: Fingertip Effacement (%): Thick Cervical Position: Posterior Station: Ballotable Presentation: Vertex Exam by:: Thea Alken, RN FHT: baseline rate 140 bpm, moderate varibility, 10 x 10 acel, 1 variable decel Toco: minimal  Labs: Lab Results  Component Value Date   WBC 8.7 12/12/2018   HGB 12.9 12/12/2018   HCT 39.8 12/12/2018   MCV 87.9 12/12/2018   PLT 239 12/12/2018    Patient Active Problem List   Diagnosis Date Noted  . Preeclampsia, severe, third trimester 12/12/2018  . ADHD (attention deficit hyperactivity disorder) 11/11/2018  . Menorrhagia 11/11/2018  . Obesity in pregnancy 10/08/2018  . Supervision of high risk pregnancy, antepartum 07/25/2018  . History of gestational hypertension 07/25/2018  . Late prenatal care, antepartum 07/25/2018  . 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 G3P1011 at 104w5d here for  IOL for preE w/SF.   Labor: Vaginal Cytotec x1 given. Consider FB placement on next exam. Consider Pitocin later in the course of labor. Continue cervical exams for the progression of labor. Fetal Wellbeing:  Cat I Pain Control:  Maternally supported for now. Can have Epidural when desired. Anticipated MOD:  Vaginal  [name], D.O. Family Medicine Resident,  PGY-1 12/12/2018, 11:32 PM

## 2018-12-12 NOTE — H&P (Addendum)
Obstetric History and Physical  [name] is a 23 y.o. G3P1011 at 6766w5d who presents to MAU for preeclampsia evaluation after presenting for concerns of DFM. Pt reports she has not felt baby move since 1430 today. Pt reports she attempted to have a cold drink, ate candy, which normally works, but these did not work today. Pt did try vibroacoustic stimulation, but that did not work either. Pt reports she felt the baby move once since being in MAU, but denies return to normal fetal movement.  Pt reports HA started on Monday and came on suddenly. Pt tried to take a single, extra strength Tylenol, use essential oils and warm bath to resolve HA, but this did not work. Pt rates HA as 5/10. 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 Pt reports checking her BP at home frequently, but denies pressure over 140/90. Hx of gHTN without preeclampsia. Last took Tylenol Monday 12/08/2018.  Pt denies blurry vision/seeing spots, N/V, epigastric pain, swelling in face and hands, sudden weight gain. Pt denies chest pain and SOB.  Pt denies constipation, diarrhea, or urinary problems. Pt denies fever, chills, fatigue, sweating or changes in appetite. Pt denies dizziness, light-headedness, weakness.  Pt denies VB, ctx, LOF.  Current pregnancy problems? hx of gHTN Blood Type? A Positive Allergies? aspirin Current medications? PNVs Current PNC & next appt? [facility], 12/18/2018 in-person  -consulted with Dr. Earlene Plateravis @2120 , d/t severe range pressure, will admit to L&D for delivery and give magnesium 4/2 and preeclampsia labs, Dr. Earlene Plateravis to enter orders for admission  [facility]  with onset of care at 17 weeks Pregnancy complications or risks: Patient Active Problem List   Diagnosis Date Noted  . ADHD (attention deficit hyperactivity disorder) 11/11/2018  . Menorrhagia 11/11/2018  . Obesity in pregnancy 10/08/2018  . Supervision of high risk pregnancy,  antepartum 07/25/2018  . History of gestational hypertension 07/25/2018  . Late prenatal care, antepartum 07/25/2018  . Abnormal platelet aggregation ([facility]) 12/24/2012   She plans to breastfeed She desires undecided for postpartum contraception.   Prenatal labs and studies: ABO, Rh: A/Positive/-- (02/28 16100943) Antibody: Negative (02/28 0943) Rubella: 3.81 (02/28 0943) RPR: Non Reactive (05/08 0838)  HBsAg: Negative (02/28 0943)  HIV: Non Reactive (05/08 0838)  GBS:  negative 2hr GTT:  88/105/135 (normal) Genetic screening normal Anatomy US normal  Prenatal Transfer Tool  Maternal Diabetes: No Genetic Screening: Normal Maternal Ultrasounds/Referrals: Normal Fetal Ultrasounds or other Referrals:  None Maternal Substance Abuse:  No Significant Maternal Medications:  None Significant Maternal Lab Results: Group B Strep negative  Past Medical History:  Diagnosis Date  . Bleeding disorder ([facility])    Cannot take ASA  . Chlamydia infection affecting pregnancy 2013  . Hypertension    Gestational HTN  . Pregnancy induced hypertension    with last pregnancy    Past Surgical History:  Procedure Laterality Date  . WISDOM TOOTH EXTRACTION      OB History  Gravida Para Term Preterm AB Living  3 1 1   1 1   SAB TAB Ectopic Multiple Live Births  1       1    # Outcome Date GA Lbr Len/2nd Weight Sex Delivery Anes PTL Lv  3 Current           2 SAB 04/17/17 6229w0d    SAB     1 Term 08/25/13 7013w5d  3487 g M Vag-Spont EPI N LIV     Complications:  Gestational hypertension    Social History   Socioeconomic History  . Marital status: Single    Spouse name: Not on file  . Number of children: Not on file  . Years of education: Not on file  . Highest education level: Not on file  Occupational History  . Occupation: Diplomatic Services operational officerMember Advocate    Comment: [employer] education [employer]  Social Needs  . Financial resource strain: Not on file  . Food insecurity    Worry: Never true    Inability: Never true  .  Transportation needs    Medical: No    Non-medical: No  Tobacco Use  . Smoking status: Never Smoker  . Smokeless tobacco: Never Used  Substance and Sexual Activity  . Alcohol use: Not Currently  . Drug use: No  . Sexual activity: Yes  Lifestyle  . Physical activity    Days per week: Not on file    Minutes per session: Not on file  . Stress: Not on file  Relationships  . Social Musicianconnections    Talks on phone: Not on file    Gets together: Not on file    Attends religious service: Not on file    Active member of club or organization: Not on file    Attends meetings of clubs or organizations: Not on file    Relationship status: Not on file  Other Topics Concern  . Not on file  Social History Narrative  . Not on file    Family History  Problem Relation Age of Onset  . Diabetes Mother   . Hypertension Mother   . Cancer Sister        Leukemia    Medications Prior to Admission  Medication Sig Dispense Refill Last Dose  . Prenatal Vit-Fe Fumarate-FA (PRENATAL MULTIVITAMIN) TABS tablet Take 1 tablet by mouth daily at 12 noon.   12/12/2018 at Unknown time  . terconazole (TERAZOL 7) 0.4 % vaginal cream Place 1 applicator vaginally at bedtime. (Patient not taking: Reported on 12/11/2018) 45 g 0     Allergies  Allergen Reactions  . Aspirin Other (See Comments)    Causes nose bleeds and frequent periods - age 23. Saw hematologist and told not to take ASA    Review of Systems: Negative except for what is mentioned in HPI.  Physical Exam: BP (!) 145/84   Pulse 84   Temp 98.6 F (37 C)   Resp 20   Ht 5\' 5"  (1.651 m)   Wt 119.3 kg   LMP 03/03/2018 (Approximate) Comment: does not know her LMP  BMI 43.77 kg/m   Patient Vitals for the past 24 hrs:  BP Temp Pulse Resp Height Weight  12/12/18 2131 (!) 145/84 - 84 - - -  12/12/18 2116 (!) 160/85 - 87 - - -  12/12/18 2100 (!) 147/79 - 86 - - -  12/12/18 2046 (!) 148/85 - 89 - - -  12/12/18 2033 (!) 146/83 98.6 F (37 C) 87 20  5\' 5"  (1.651 m) 119.3 kg    CONSTITUTIONAL: Well-developed, well-nourished female in no acute distress.  HENT:  Normocephalic, atraumatic, External right and left ear normal. Oropharynx is clear and moist EYES: Conjunctivae and EOM are normal. Pupils are equal, round, and reactive to light. No scleral icterus.  NECK: Normal range of motion, supple, no masses SKIN: Skin is warm and dry. No rash noted. Not diaphoretic. No erythema. No pallor. NEUROLOGIC: Alert and oriented to person, place, and time. Normal reflexes, muscle tone coordination. No cranial  nerve deficit noted. PSYCHIATRIC: Normal mood and affect. Normal behavior. Normal judgment and thought content. CARDIOVASCULAR: Normal heart rate noted, regular rhythm RESPIRATORY: Effort and breath sounds normal, no problems with respiration noted ABDOMEN: Soft, nontender, nondistended, gravid. MUSCULOSKELETAL: Normal range of motion. No edema and no tenderness. 2+ distal pulses.  Cervical Exam:     Presentation: VTX, confirmed by bedside US FHT:  -EFM: reactive       -baseline: 150       -variability: moderate       -accels: present       -decels: single variable?       -TOCO: irregular   Pertinent Labs/Studies:   Results for orders placed or performed during the hospital encounter of 12/12/18 (from the past 24 hour(s))  Urinalysis, Routine w reflex microscopic     Status: Abnormal   Collection Time: 12/12/18  8:40 PM  Result Value Ref Range   Color, Urine YELLOW YELLOW   APPearance HAZY (A) CLEAR   Specific Gravity, Urine 1.011 1.005 - 1.030   pH 7.0 5.0 - 8.0   Glucose, UA NEGATIVE NEGATIVE mg/dL   Hgb urine dipstick NEGATIVE NEGATIVE   Bilirubin Urine NEGATIVE NEGATIVE   Ketones, ur NEGATIVE NEGATIVE mg/dL   Protein, ur NEGATIVE NEGATIVE mg/dL   Nitrite NEGATIVE NEGATIVE   Leukocytes,Ua MODERATE (A) NEGATIVE   RBC / HPF 0-5 0 - 5 RBC/hpf   WBC, UA 11-20 0 - 5 WBC/hpf   Bacteria, UA NONE SEEN NONE SEEN   Squamous  Epithelial / LPF 11-20 0 - 5    Assessment : Samantha Stanton is a 23 y.o. G3P1011 at [redacted]w[redacted]d being admitted for induction of labor due to severe preeclampsia.  Plan: Labor: Expectant management vs. Induction/Augmentation as ordered as per protocol.  Analgesia as needed. FWB: Reassuring fetal heart tracing.   GBS negative Delivery plan: Hopeful for vaginal delivery  H&P per Vernice Jefferson NP  Addendum: Plan on beginning cx ripening with cytotec followed by cervical foley when able; plan for Pit/AROM later on in process prn  Myrtis Ser [facility] 12/13/2018

## 2018-12-12 NOTE — MAU Note (Addendum)
SAYS LAST TIME FELT BABY MOVE WAS 230PM-  IN TRIAGE - FHR-155.    SHE ATE AND DRANK -  NO INCREASE IN MOVEMENT.  HAS BEEN VOIDING A LOT- EATS A LOT OF ICE .  NO REG UC'S.   HAS HAD H/A - STARTED ON Monday -  TOOK TYLENOL- AND OILS . [place] WITH  CLINIC-  HAD AN APPOINTMENT ON Monday- TOLD DR  ABOUT H/A - TOLD TO COME IF NEEDED.   SHE CHECKED BP  AT HOME- 131/75- TODAY . DENIES BLURRED VISION/ EPIGASTRIC . HAS SWELLING AFTER EXERCISING.

## 2018-12-12 NOTE — MAU Note (Signed)
Vertex Presentation Confirmed by Vernice Jefferson NP.

## 2018-12-12 NOTE — MAU Provider Note (Signed)
History     CSN: [id number]  [id number] date and time: 12/12/18 2018   First Provider Initiated Contact with Patient 12/12/18 2102      Chief Complaint  Patient presents with  . Decreased Fetal Movement   Samantha Stanton AnesMacie Stanton is a 23 y.o. G3P1011 at 704w5d who presents to MAU for preeclampsia evaluation after presenting for concerns of DFM. Pt reports she has not felt baby move since 1430 today. Pt reports she attempted to have a cold drink, ate candy, which normally works, but these did not work today. Pt did try vibroacoustic stimulation, but that did not work either. Pt reports she felt the baby move once since being in MAU, but denies return to normal fetal movement.  Pt reports HA started on Monday and came on suddenly. Pt tried to take a single, extra strength Tylenol, use essential oils and warm bath to resolve HA, but this did not work. Pt rates HA as 5/10. 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 Pt reports checking her BP at home frequently, but denies pressure over 140/90. Hx of gHTN without preeclampsia. Last took Tylenol Monday 12/08/2018.  Pt denies blurry vision/seeing spots, N/V, epigastric pain, swelling in face and hands, sudden weight gain. Pt denies chest pain and SOB.  Pt denies constipation, diarrhea, or urinary problems. Pt denies fever, chills, fatigue, sweating or changes in appetite. Pt denies dizziness, light-headedness, weakness.  Pt denies VB, ctx, LOF.  Current pregnancy problems? hx of gHTN Blood Type? A Positive Allergies? aspirin Current medications? PNVs Current PNC & next appt? [facility], 12/18/2018 in-person   OB History    Gravida  3   Para  1   Term  1   Preterm      AB  1   Living  1     SAB  1   TAB      Ectopic      Multiple      Live Births  1           Past Medical History:  Diagnosis Date  . Bleeding disorder (HCC)    Cannot take ASA  . Chlamydia infection affecting pregnancy 2013  .  Hypertension    Gestational HTN  . Pregnancy induced hypertension    with last pregnancy    Past Surgical History:  Procedure Laterality Date  . WISDOM TOOTH EXTRACTION      Family History  Problem Relation Age of Onset  . Diabetes Mother   . Hypertension Mother   . Cancer Sister        Leukemia    Social History   Tobacco Use  . Smoking status: Never Smoker  . Smokeless tobacco: Never Used  Substance Use Topics  . Alcohol use: Not Currently  . Drug use: No    Allergies:  Allergies  Allergen Reactions  . Aspirin Other (See Comments)    Causes nose bleeds and frequent periods - age 23. Saw hematologist and told not to take ASA    Medications Prior to Admission  Medication Sig Dispense Refill Last Dose  . Prenatal Vit-Fe Fumarate-FA (PRENATAL MULTIVITAMIN) TABS tablet Take 1 tablet by mouth daily at 12 noon.   12/12/2018 at Unknown time  . terconazole (TERAZOL 7) 0.4 % vaginal cream Place 1 applicator vaginally at bedtime. (Patient not taking: Reported on 12/11/2018) 45 g 0     Review of Systems  Constitutional: Negative for chills, diaphoresis, fatigue and fever.  Respiratory: Negative for shortness of  breath.   Cardiovascular: Negative for chest pain.  Gastrointestinal: Negative for abdominal pain, constipation, diarrhea, nausea and vomiting.  Genitourinary: Negative for dysuria, flank pain, frequency, pelvic pain, urgency, vaginal bleeding and vaginal discharge.  Neurological: Positive for headaches. Negative for dizziness, weakness and light-headedness.   Physical Exam   Blood pressure (!) 151/90, pulse 82, temperature 98.3 F (36.8 C), temperature source Oral, resp. rate 16, height  (1.651 m), weight 119.3 kg, last menstrual period 03/03/2018.  Patient Vitals for the past 24 hrs:  BP Temp Temp src Pulse Resp Height Weight  12/13/18 0028 - 98.3 F (36.8 C) Oral - - - -  12/13/18 0000 (!) 151/90 - - 82 16 - -  12/12/18 2332 (!) 144/89 - - 90 16 - -   12/12/18 2302 (!) 157/83 - - 86 18 - -  12/12/18 2247 (!) 166/88 - - 92 (P) 18 - -  12/12/18 2241 (!) 156/88 - - 95 (P) 18 - -  12/12/18 2237 (!) 149/106 - - (!) 101 (P) 18 - -  12/12/18 2226 (!) (P) 170/99 - - 90 (P) 16 - -  12/12/18 2221 (!) 162/87 - - 92 (P) 18 - -  12/12/18 2219 (!) 148/106 - - 96 (P) 18 - -  12/12/18 2131 (!) 145/84 - - 84 - - -  12/12/18 2116 (!) 160/85 - - 87 - - -  12/12/18 2100 (!) 147/79 - - 86 - - -  12/12/18 2046 (!) 148/85 - - 89 - - -  12/12/18 2033 (!) 146/83 98.6 F (37 C) - 87 20  (1.651 m) 119.3 kg   Physical Exam  Constitutional: She is oriented to person, place, and time. She appears well-developed and well-nourished. No distress.  HENT:  Head: Normocephalic and atraumatic.  Respiratory: Effort normal.  GI: Soft. She exhibits no distension and no mass. There is no abdominal tenderness. There is no rebound and no guarding.  Neurological: She is alert and oriented to person, place, and time.  Skin: Skin is warm and dry. She is not diaphoretic.  Psychiatric: She has a normal mood and affect. Her behavior is normal. Judgment and thought content normal.   Results for orders placed or performed during the hospital encounter of 12/12/18 (from the past 24 hour(s))  Urinalysis, Routine w reflex microscopic     Status: Abnormal   Collection Time: 12/12/18  8:40 PM  Result Value Ref Range   Color, Urine YELLOW YELLOW   APPearance HAZY (A) CLEAR   Specific Gravity, Urine 1.011 1.005 - 1.030   pH 7.0 5.0 - 8.0   Glucose, UA NEGATIVE NEGATIVE mg/dL   Hgb urine dipstick NEGATIVE NEGATIVE   Bilirubin Urine NEGATIVE NEGATIVE   Ketones, ur NEGATIVE NEGATIVE mg/dL   Protein, ur NEGATIVE NEGATIVE mg/dL   Nitrite NEGATIVE NEGATIVE   Leukocytes,Ua MODERATE (A) NEGATIVE   RBC / HPF 0-5 0 - 5 RBC/hpf   WBC, UA 11-20 0 - 5 WBC/hpf   Bacteria, UA NONE SEEN NONE SEEN   Squamous Epithelial / LPF 11-20 0 - 5  Protein / creatinine ratio, urine     Status: None    Collection Time: 12/12/18  8:40 PM  Result Value Ref Range   Creatinine, Urine 143.25 mg/dL   Total Protein, Urine 19 mg/dL   Protein Creatinine Ratio 0.13 0.00 - 0.15 mg/mg[Cre]  SARS Coronavirus 2 (CEPHEID - Performed in [facility] hospital lab), Hosp Order     Status: None  Collection Time: 12/12/18  9:51 PM   Specimen: Nasopharyngeal Swab  Result Value Ref Range   SARS Coronavirus 2 NEGATIVE NEGATIVE  CBC     Status: None   Collection Time: 12/12/18  9:58 PM  Result Value Ref Range   WBC 8.7 4.0 - 10.5 K/uL   RBC 4.53 3.87 - 5.11 MIL/uL   Hemoglobin 12.9 12.0 - 15.0 g/dL   HCT 16.1 09.6 - 04.5 %   MCV 87.9 80.0 - 100.0 fL   MCH 28.5 26.0 - 34.0 pg   MCHC 32.4 30.0 - 36.0 g/dL   RDW 40.9 81.1 - 91.4 %   Platelets 239 150 - 400 K/uL   nRBC 0.0 0.0 - 0.2 %  Comprehensive metabolic panel     Status: Abnormal   Collection Time: 12/12/18  9:58 PM  Result Value Ref Range   Sodium 138 135 - 145 mmol/L   Potassium 3.0 (L) 3.5 - 5.1 mmol/L   Chloride 107 98 - 111 mmol/L   CO2 21 (L) 22 - 32 mmol/L   Glucose, Bld 81 70 - 99 mg/dL   BUN <5 (L) 6 - 20 mg/dL   Creatinine, Ser 7.82 0.44 - 1.00 mg/dL   Calcium 9.2 8.9 - 95.6 mg/dL   Total Protein 7.1 6.5 - 8.1 g/dL   Albumin 3.1 (L) 3.5 - 5.0 g/dL   AST 17 15 - 41 U/L   ALT 14 0 - 44 U/L   Alkaline Phosphatase 79 38 - 126 U/L   Total Bilirubin 0.4 0.3 - 1.2 mg/dL   GFR calc non Af Amer >60 >60 mL/min   GFR calc Af Amer >60 >60 mL/min   Anion gap 10 5 - 15  Type and screen [facility]     Status: None   Collection Time: 12/12/18  9:58 PM  Result Value Ref Range   ABO/RH(D) A POS    Antibody Screen NEG    Sample Expiration      12/15/2018,2359 Performed at [facility], [address], [place], [place] [address]   ABO/Rh     Status: None   Collection Time: 12/12/18  9:58 PM  Result Value Ref Range   ABO/RH(D)      A POS Performed at [facility], [address] [place], [place] [address]      No results found.  MAU Course  Procedures  MDM -preeclampsia evaluation -CBC/CMP/PCr ordered -  Tylenol ordered for HA -EFM: reactive       -baseline: 150       -variability: moderate       -accels: present       -decels: single variable?       -TOCO: irregular -consulted with Dr. Earlene Plater , d/t severe range pressure, will admit to L&D for delivery and give magnesium 4g/2g and preeclampsia labs, Dr. Earlene Plater to enter orders for admission -admit to L&D for delivery  Orders Placed This Encounter  Procedures  . SARS Coronavirus 2 (CEPHEID - Performed in [facility] hospital lab), Hosp Order    Standing Status:   Standing    Number of Occurrences:   1    Order Specific Question:   Rule Out    Answer:   Yes  . Urinalysis, Routine w reflex microscopic    Standing Status:   Standing    Number of Occurrences:   1  . CBC    Standing Status:   Standing    Number of Occurrences:  1  . Comprehensive metabolic panel    Standing Status:   Standing    Number of Occurrences:   1  . Protein / creatinine ratio, urine    Standing Status:   Standing    Number of Occurrences:   1  . RPR    Standing Status:   Standing    Number of Occurrences:   1  . Diet regular Room service appropriate? Yes; Fluid consistency: Thin    Standing Status:   Standing    Number of Occurrences:   1    Order Specific Question:   Room service appropriate?    Answer:   Yes    Order Specific Question:   Fluid consistency:    Answer:   Thin  . Notify Physician    Confirmatory reading of BP> 160/110 15 minutes later    Standing Status:   Standing    Number of Occurrences:   1    Order Specific Question:   Notify Physician    Answer:   Temp greater than or equal to 100.4    Order Specific Question:   Notify Physician    Answer:   RR greater than 24 or less than 10    Order Specific Question:   Notify Physician    Answer:   HR greater than 120 or less than 50    Order Specific Question:   Notify  Physician    Answer:   SBP greater than 160 mmHG or less than 80 mmHG    Order Specific Question:   Notify Physician    Answer:   DBP greater than 110 mmHG or less than 45 mmHG    Order Specific Question:   Notify Physician    Answer:   Urinary output is less than for any 4 hour period  . Measure blood pressure    20 minutes after giving hydralazine 10 MG IV dose.  Call MD if SBP >/= 160 or DBP >/= 110.    Standing Status:   Standing    Number of Occurrences:   1  . Vitals signs per unit policy    Standing Status:   Standing    Number of Occurrences:   1  . Notify Physician    Standing Status:   Standing    Number of Occurrences:   1    Order Specific Question:   Notify Physician    Answer:   for vaginal bleeding    Order Specific Question:   Notify Physician    Answer:   for acute abdominal pain    Order Specific Question:   Notify Physician    Answer:   for temperature >/= 100.4 F    Order Specific Question:   Notify Physician    Answer:   for significant change in vital signs    Order Specific Question:   Notify Physician    Answer:   for non-reassuring fetal heart rate pattern    Order Specific Question:   Notify Physician    Answer:   for imminent delivery or failure to progress  . Fetal monitoring per unit policy    Standing Status:   Standing    Number of Occurrences:   1  . Activity as tolerated    Standing Status:   Standing    Number of Occurrences:   1  . Cervical Exam    Unless contraindicated, every 1-2 hours in active labor, or at nurse's discretion    Standing Status:  Standing    Number of Occurrences:   1  . Measure blood pressure post delivery every 15 min x 1 hour then every 30 min x 1 hour    Standing Status:   Standing    Number of Occurrences:   1  . Fundal check post delivery every 15 min x 1 hour then every 30 min x 1 hour    Standing Status:   Standing    Number of Occurrences:   1  . If Rapid HIV test positive or known HIV positive:  initiate AZT orders    Standing Status:   Standing    Number of Occurrences:   1  . May in and out cath x 2 for inability to void    Standing Status:   Standing    Number of Occurrences:   [phone]  . Insert foley catheter    1) If straight catheterized greater than 2 times or patient unable to void post epidural placement.  2) PRN for bladder distention.      Standing Status:   Standing    Number of Occurrences:   1  . Discontinue foley prior to vaginal delivery    Standing Status:   Standing    Number of Occurrences:   1  . Initiate Carrier Fluid Protocol    Standing Status:   Standing    Number of Occurrences:   1  . Initiate Oral Care Protocol    Standing Status:   Standing    Number of Occurrences:   1  . Order Rapid HIV per protocol if no results on chart    Standing Status:   Standing    Number of Occurrences:   1  . Full code    Standing Status:   Standing    Number of Occurrences:   1  . Type and screen [facility]    [facility]     Standing Status:   Standing    Number of Occurrences:   1  . ABO/Rh    Standing Status:   Standing    Number of Occurrences:   1  . Insert and maintain IV Line    Standing Status:   Standing    Number of Occurrences:   1  . Admit to Inpatient (patient's expected length of stay will be greater than 2 midnights or inpatient only procedure)    Standing Status:   Standing    Number of Occurrences:   1    Order Specific Question:   Hospital Area    Answer:   [facility] [[id number]]    Order Specific Question:   Level of Care    Answer:   Labor and Delivery [101]    Order Specific Question:   Covid Evaluation    Answer:   Asymptomatic Screening Protocol (No Symptoms)    Order Specific Question:   Diagnosis    Answer:   Preeclampsia, severe, third trimester [[id number]]    Order Specific Question:   Admitting Physician    Answer:   Conan Bowens [[id number]]    Order Specific Question:    Attending Physician    Answer:   Conan Bowens [[id number]]    Order Specific Question:   Estimated length of stay    Answer:   past midnight tomorrow    Order Specific Question:   Certification:    Answer:   I certify this patient will need inpatient services for at  least 2 midnights    Order Specific Question:   PT Class (Do Not Modify)    Answer:   Inpatient [101]    Order Specific Question:   PT Acc Code (Do Not Modify)    Answer:   Private [1]     Assessment and Plan   -admit to L&D for delivery -severe preeclampsia  Gerrie Nordmann Tammatha Cobb 12/13/2018, 1:36 AM

## 2018-12-13 LAB — ABO/RH: ABO/RH(D): A POS

## 2018-12-13 LAB — RPR: RPR Ser Ql: NONREACTIVE

## 2018-12-13 MED ORDER — TERBUTALINE SULFATE 1 MG/ML IJ SOLN: 0.2500 mg | Freq: Once | INTRAMUSCULAR | Status: DC | PRN

## 2018-12-13 MED ORDER — MISOPROSTOL 50MCG HALF TABLET
50.0000 ug | ORAL_TABLET | ORAL | Status: DC | PRN
  Administered 2018-12-13: 50 ug via ORAL

## 2018-12-13 MED ORDER — OXYTOCIN 40 UNITS IN NORMAL SALINE INFUSION - SIMPLE MED
1.0000 m[IU]/min | INTRAVENOUS | Status: DC
  Administered 2018-12-13: 2 m[IU]/min via INTRAVENOUS

## 2018-12-13 NOTE — Progress Notes (Signed)
Labor Progress Note Samantha Stanton is a 23 y.o. G3P1011 at [redacted]w[redacted]d presented for IOL for preeclampsia with severe features  S:  Patient comfortable. Denies HA, visual changes or epigastric pain. Requesting to take a shower.  O:  BP 132/88   Pulse 88   Temp 98.3 F (36.8 C) (Axillary)   Resp 18   Ht 5\' 5"  (1.651 m)   Wt 119.3 kg   LMP 03/03/2018 (Approximate) Comment: does not know her LMP  BMI 43.77 kg/m   Fetal Tracing:  Baseline: 140 Variability: moderate Accels: 15x15 Decels: variable  Toco: 1-7   CVE: Dilation: 3 Effacement (%): 50, 60 Cervical Position: Posterior Station: -3 Presentation: Vertex Exam by:: Chrys Racer, CNM   A&P: 23 y.o. [id number] [redacted]w[redacted]d IOL for preeclampsia with severe features. #Labor: Cervix thinning and changing after last dose of cytotec. Will start pitocin 2x2 after patient eats and showers #Pain: per patient request #FWB: Cat 1 #GBS negative   Wende Mott, CNM 4:57 PM

## 2018-12-13 NOTE — Progress Notes (Signed)
Labor Progress Note Zaniya Mcaulay is a 23 y.o. G3P1011 at [redacted]w[redacted]d presented for preeclampsia with severe features.  S:  Patient uncomfortable with intermittent contractions. Denies any HA, visual changes or epigastric pain.  O:  BP (!) 156/84   Pulse 90   Temp 98.3 F (36.8 C) (Axillary)   Resp 16   Ht 5\' 5"  (1.651 m)   Wt 119.3 kg   LMP 03/03/2018 (Approximate) Comment: does not know her LMP  BMI 43.77 kg/m    Fetal Tracing:  Baseline: 130 Variability: moderate Accels: 15x15 Decels: none  Toco: 5-7   CVE: Dilation: 2 Effacement (%): Thick Cervical Position: Posterior Station: -3 Presentation: Vertex Exam by:: Epifanio Lesches, RNC   A&P: 23 y.o. G3P1011 [redacted]w[redacted]d IOL for preeclampsia with severe features #Labor: Cytotec dose x3. Discussed with patient FB for cervical ripening. Patient declines at this time but agreeable to placement with next exam if unchanged. #Pain: per patient request  #FWB: Cat 1 #GBS negative  Wende Mott, CNM 9:56 AM

## 2018-12-13 NOTE — Progress Notes (Addendum)
LABOR PROGRESS NOTE  Samantha Stanton is a 23 y.o. G3P1011 at [redacted]w[redacted]d  admitted for IOL due to preE w/SF  Subjective: She is doing well with minimal discomfort with contractions.   Objective: BP (!) 144/76   Pulse (!) 101   Temp 98.3 F (36.8 C) (Oral)   Resp 16   Ht 5\' 5"  (1.651 m)   Wt 119.3 kg   LMP 03/03/2018 (Approximate) Comment: does not know her LMP  BMI 43.77 kg/m  or  Vitals:   12/13/18 0028 12/13/18 0101 12/13/18 0203 12/13/18 0300  BP:  (!) 154/90 (!) 146/73 (!) 144/76  Pulse:  82 85 (!) 101  Resp:   16 16  Temp: 98.3 F (36.8 C)     TempSrc: Oral     Weight:      Height:         Dilation: 1 Effacement (%): Thick Cervical Position: Posterior Station: Ballotable Presentation: Vertex Exam by:: Dr. Janus Molder FHT: baseline rate 155 bpm, moderate varibility, 10 x 10 acel, no decel Toco: 5mins  Labs: Lab Results  Component Value Date   WBC 8.7 12/12/2018   HGB 12.9 12/12/2018   HCT 39.8 12/12/2018   MCV 87.9 12/12/2018   PLT 239 12/12/2018    Patient Active Problem List   Diagnosis Date Noted  . Preeclampsia, severe, third trimester 12/12/2018  . ADHD (attention deficit hyperactivity disorder) 11/11/2018  . Menorrhagia 11/11/2018  . Obesity in pregnancy 10/08/2018  . Supervision of high risk pregnancy, antepartum 07/25/2018  . History of gestational hypertension 07/25/2018  . Late prenatal care, antepartum 07/25/2018  . Abnormal platelet aggregation ([place]) 12/24/2012    Assessment / Plan: 23 y.o. G3P1011 at [redacted]w[redacted]d here for IOL due to preE w/ SF.  Labor: 25 mcg Vaginal Cytotec x2 given. Continue cervical exams for progression of labor. FB attempted, was not successful at this time. Consider FB later. Consider Pitocin later.  Fetal Wellbeing:  Cat I Pain Control:  Maternally supported. May have epidural. Anticipated MOD:  Vaginal  Antwane Grose [name], D.O. Family Medicine Resident, PGY-1 12/13/2018, 4:16 AM

## 2018-12-13 NOTE — Progress Notes (Signed)
Labor Progress Note Samantha Stanton is a 23 y.o. G3P1011 at [redacted]w[redacted]d presented for IOL for preeclampsia with severe features  S:  Patient still uncomfortable but coping well.  O:  BP (!) 154/91   Pulse (!) 102   Temp 98.3 F (36.8 C) (Axillary)   Resp 18   Ht 5\' 5"  (1.651 m)   Wt 119.3 kg   LMP 03/03/2018 (Approximate) Comment: does not know her LMP  BMI 43.77 kg/m   Fetal Tracing:  Baseline: 140 Variability: moderate Accels: 15x15 Decels: none  Toco: 2-4   CVE: Dilation: 2 Effacement (%): Thick Cervical Position: Posterior Station: -3 Presentation: Vertex Exam by:: Epifanio Lesches, RNC   A&P: 23 y.o. G3P1011 [redacted]w[redacted]d IOL for preeclampsia with severe features #Labor: Attempted foley bulb x2 without success. Will switch to oral cytotec. #Pain: per patient request #FWB: Cat 1 #GBS negative  Wende Mott, CNM 12:46 PM

## 2018-12-13 NOTE — Progress Notes (Signed)
LABOR PROGRESS NOTE  Samantha Stanton is a 23 y.o. G3P1011 at [redacted]w[redacted]d admitted for IOL for preeclampsia with severe features.  Subjective: Doing well, breathing through painful contractions. No concerns at this time.   Objective: BP (!) 154/85   Pulse 90   Temp 98.4 F (36.9 C) (Oral)   Resp 18   Ht 5\' 5"  (1.651 m)   Wt 119.3 kg   LMP 03/03/2018 (Approximate) Comment: does not know her LMP  BMI 43.77 kg/m  or  Vitals:   12/13/18 1901 12/13/18 1934 12/13/18 2003 12/13/18 2034  BP: (!) 147/82 (!) 158/92 (!) 134/41 (!) 154/85  Pulse: 90 89 89 90  Resp: 18 18 18 18   Temp:      TempSrc:      Weight:      Height:       Dilation: 3 Effacement (%): 60 Cervical Position: Posterior Station: -2 Presentation: Vertex Exam by:: Dr.Drexel Ivey FHT: Baseline rate 150, moderate varibility, +accel, -decel Toco: Contractions every 2-4 minutes  Labs: Lab Results  Component Value Date   WBC 8.7 12/12/2018   HGB 12.9 12/12/2018   HCT 39.8 12/12/2018   MCV 87.9 12/12/2018   PLT 239 12/12/2018    Patient Active Problem List   Diagnosis Date Noted  . Preeclampsia, severe, third trimester 12/12/2018  . ADHD (attention deficit hyperactivity disorder) 11/11/2018  . Menorrhagia 11/11/2018  . Obesity in pregnancy 10/08/2018  . Supervision of high risk pregnancy, antepartum 07/25/2018  . History of gestational hypertension 07/25/2018  . Late prenatal care, antepartum 07/25/2018  . Abnormal platelet aggregation ([place]) 12/24/2012    Assessment / Plan: 23 y.o. G3P1011 at [redacted]w[redacted]d here for IOL for preeclampsia with severe features.  Labor: Minimal change since last evaluation. AROM performed. Continue Pitocin per protocol. Fetal Wellbeing:  Category 1 tracing as above Pain Control:  None Anticipated MOD:  SVD Pre-eclampsia with SF:  Asymptomatic on mag, continue to monitor.  Vilma Meckel, MD  Family Medicine, PGY-2 12/13/2018, 8:51 PM

## 2018-12-14 ENCOUNTER — Inpatient Hospital Stay (HOSPITAL_COMMUNITY): Payer: No Typology Code available for payment source | Admitting: Anesthesiology

## 2018-12-14 ENCOUNTER — Encounter (HOSPITAL_COMMUNITY): Payer: Self-pay

## 2018-12-14 DIAGNOSIS — Z3A37 37 weeks gestation of pregnancy: Secondary | ICD-10-CM

## 2018-12-14 DIAGNOSIS — O1414 Severe pre-eclampsia complicating childbirth: Secondary | ICD-10-CM

## 2018-12-14 LAB — CBC
HCT: 37.9 % (ref 36.0–46.0)
Hemoglobin: 12.4 g/dL (ref 12.0–15.0)
MCH: 28.5 pg (ref 26.0–34.0)
MCHC: 32.7 g/dL (ref 30.0–36.0)
MCV: 87.1 fL (ref 80.0–100.0)
Platelets: 237 10*3/uL (ref 150–400)
RBC: 4.35 MIL/uL (ref 3.87–5.11)
RDW: 14.7 % (ref 11.5–15.5)
WBC: 11 10*3/uL — ABNORMAL HIGH (ref 4.0–10.5)
nRBC: 0 % (ref 0.0–0.2)

## 2018-12-14 MED ORDER — OXYCODONE HCL 5 MG PO TABS: 5.0000 mg | ORAL_TABLET | ORAL | Status: DC | PRN

## 2018-12-14 MED ORDER — PHENYLEPHRINE 40 MCG/ML (10ML) SYRINGE FOR IV PUSH (FOR BLOOD PRESSURE SUPPORT)
PREFILLED_SYRINGE | INTRAVENOUS | Status: AC
  Filled 2018-12-14: qty 10

## 2018-12-14 MED ORDER — PHENYLEPHRINE 40 MCG/ML (10ML) SYRINGE FOR IV PUSH (FOR BLOOD PRESSURE SUPPORT)
80.0000 ug | PREFILLED_SYRINGE | INTRAVENOUS | Status: DC | PRN
  Filled 2018-12-14: qty 10

## 2018-12-14 MED ORDER — SENNOSIDES-DOCUSATE SODIUM 8.6-50 MG PO TABS
2.0000 | ORAL_TABLET | ORAL | Status: DC
  Administered 2018-12-14 – 2018-12-16 (×2): 2 via ORAL
  Filled 2018-12-14 (×3): qty 2

## 2018-12-14 MED ORDER — FENTANYL-BUPIVACAINE-NACL 0.5-0.125-0.9 MG/250ML-% EP SOLN: 12.0000 mL/h | EPIDURAL | Status: DC | PRN

## 2018-12-14 MED ORDER — SIMETHICONE 80 MG PO CHEW: 80.0000 mg | CHEWABLE_TABLET | ORAL | Status: DC | PRN

## 2018-12-14 MED ORDER — EPHEDRINE 5 MG/ML INJ
10.0000 mg | INTRAVENOUS | Status: DC | PRN
  Filled 2018-12-14: qty 2

## 2018-12-14 MED ORDER — COCONUT OIL OIL
1.0000 "application " | TOPICAL_OIL | Status: DC | PRN
  Administered 2018-12-15: 1 via TOPICAL

## 2018-12-14 MED ORDER — DIPHENHYDRAMINE HCL 25 MG PO CAPS: 25.0000 mg | ORAL_CAPSULE | Freq: Four times a day (QID) | ORAL | Status: DC | PRN

## 2018-12-14 MED ORDER — ZOLPIDEM TARTRATE 5 MG PO TABS: 5.0000 mg | ORAL_TABLET | Freq: Every evening | ORAL | Status: DC | PRN

## 2018-12-14 MED ORDER — DIPHENHYDRAMINE HCL 50 MG/ML IJ SOLN: 12.5000 mg | INTRAMUSCULAR | Status: DC | PRN

## 2018-12-14 MED ORDER — LACTATED RINGERS IV SOLN
500.0000 mL | Freq: Once | INTRAVENOUS | Status: AC
  Administered 2018-12-14: 500 mL via INTRAVENOUS

## 2018-12-14 MED ORDER — PRENATAL MULTIVITAMIN CH
1.0000 | ORAL_TABLET | Freq: Every day | ORAL | Status: DC
  Administered 2018-12-14 – 2018-12-17 (×4): 1 via ORAL
  Filled 2018-12-14 (×4): qty 1

## 2018-12-14 MED ORDER — SODIUM CHLORIDE (PF) 0.9 % IJ SOLN
INTRAMUSCULAR | Status: DC | PRN
  Administered 2018-12-14: 12 mL/h via EPIDURAL

## 2018-12-14 MED ORDER — LIDOCAINE HCL (PF) 1 % IJ SOLN
INTRAMUSCULAR | Status: DC | PRN
  Administered 2018-12-14 (×2): 5 mL via EPIDURAL

## 2018-12-14 MED ORDER — ONDANSETRON HCL 4 MG/2ML IJ SOLN: 4.0000 mg | INTRAMUSCULAR | Status: DC | PRN

## 2018-12-14 MED ORDER — WITCH HAZEL-GLYCERIN EX PADS: 1.0000 "application " | MEDICATED_PAD | CUTANEOUS | Status: DC | PRN

## 2018-12-14 MED ORDER — ACETAMINOPHEN 325 MG PO TABS
650.0000 mg | ORAL_TABLET | ORAL | Status: DC | PRN
  Administered 2018-12-16 – 2018-12-17 (×4): 650 mg via ORAL
  Filled 2018-12-14 (×4): qty 2

## 2018-12-14 MED ORDER — BENZOCAINE-MENTHOL 20-0.5 % EX AERO: 1.0000 "application " | INHALATION_SPRAY | CUTANEOUS | Status: DC | PRN

## 2018-12-14 MED ORDER — IBUPROFEN 600 MG PO TABS
600.0000 mg | ORAL_TABLET | Freq: Four times a day (QID) | ORAL | Status: DC
  Administered 2018-12-14 – 2018-12-15 (×4): 600 mg via ORAL
  Filled 2018-12-14 (×8): qty 1

## 2018-12-14 MED ORDER — ONDANSETRON HCL 4 MG PO TABS: 4.0000 mg | ORAL_TABLET | ORAL | Status: DC | PRN

## 2018-12-14 MED ORDER — FENTANYL-BUPIVACAINE-NACL 0.5-0.125-0.9 MG/250ML-% EP SOLN
EPIDURAL | Status: AC
  Filled 2018-12-14: qty 250

## 2018-12-14 MED ORDER — DIBUCAINE (PERIANAL) 1 % EX OINT: 1.0000 "application " | TOPICAL_OINTMENT | CUTANEOUS | Status: DC | PRN

## 2018-12-14 MED ORDER — LACTATED RINGERS IV SOLN
INTRAVENOUS | Status: DC
  Administered 2018-12-14: 17:00:00 via INTRAVENOUS

## 2018-12-14 MED ORDER — MEASLES, MUMPS & RUBELLA VAC IJ SOLR: 0.5000 mL | Freq: Once | INTRAMUSCULAR | Status: DC

## 2018-12-14 MED ORDER — OXYCODONE HCL 5 MG PO TABS: 10.0000 mg | ORAL_TABLET | ORAL | Status: DC | PRN

## 2018-12-14 MED ORDER — TETANUS-DIPHTH-ACELL PERTUSSIS 5-2.5-18.5 LF-MCG/0.5 IM SUSP: 0.5000 mL | Freq: Once | INTRAMUSCULAR | Status: DC

## 2018-12-14 NOTE — Anesthesia Preprocedure Evaluation (Signed)
Anesthesia Evaluation  Patient identified by MRN, date of birth, ID band Patient awake    Reviewed: Allergy & Precautions, H&P , NPO status , Patient's Chart, lab work & pertinent test results  Airway Mallampati: II   Neck ROM: full    Dental   Pulmonary neg pulmonary ROS,    breath sounds clear to auscultation       Cardiovascular hypertension,  Rhythm:regular Rate:Normal     Neuro/Psych    GI/Hepatic   Endo/Other  Morbid obesity  Renal/GU      Musculoskeletal   Abdominal   Peds  Hematology Pt describes h/o bleeding disorder years ago which has since resolved.  She experienced heavy menses at the time and was told not to take ASA as this would make it worse.  She denies currently having any abnormal bruising or bleeding.    PLTS 237 today.   Anesthesia Other Findings   Reproductive/Obstetrics                             Anesthesia Physical Anesthesia Plan  ASA: II  Anesthesia Plan: Epidural   Post-op Pain Management:    Induction: Intravenous  PONV Risk Score and Plan: 2 and Treatment may vary due to age or medical condition  Airway Management Planned: Natural Airway  Additional Equipment:   Intra-op Plan:   Post-operative Plan:   Informed Consent: I have reviewed the patients History and Physical, chart, labs and discussed the procedure including the risks, benefits and alternatives for the proposed anesthesia with the patient or authorized representative who has indicated his/her understanding and acceptance.       Plan Discussed with: CRNA, Anesthesiologist and Surgeon  Anesthesia Plan Comments: (Among the risks discussed, bleeding and nerve damage were discussed in depth due to concern for her h/o abnormal bleeding (heavy menses) in the past.  She feels strongly that this is no longer an issue and does not have heavy periods any longer.  With this in mind, a normal PLT  level, and h/o uneventful labor epidural at [facility] in 2015, we agreed to proceed with labor epidural placement today.)        Anesthesia Quick Evaluation

## 2018-12-14 NOTE — Plan of Care (Signed)
Pt. Transitioned to postpartum care on OBSC. Mag gtt running. Couplet care. Breastfeeding frequency explained. Will monitor.

## 2018-12-14 NOTE — Plan of Care (Signed)
Pt is progressing well. Bleeding is scant to small and rubra w/ firm fundus u/1. She is coming off of mag gtt 12/15/18 at 0300. There was no stop time in orders and Dr. Nehemiah Settle was called at 2047 to verify stop time. Verbal order to modify stop time as stated above. Pt has no complaints, practicing hand expression and breast feeding. Baby is latching well and staying on for about 15-20 min. Will continue to monitor and assist prn.

## 2018-12-14 NOTE — Anesthesia Procedure Notes (Signed)
Epidural Patient location during procedure: OB Start time: 12/14/2018 1:44 AM End time: 12/14/2018 1:55 AM  Staffing Anesthesiologist: Albertha Ghee, MD Performed: anesthesiologist   Preanesthetic Checklist Completed: patient identified, site marked, pre-op evaluation, timeout performed, IV checked, risks and benefits discussed and monitors and equipment checked  Epidural Patient position: sitting Prep: DuraPrep Patient monitoring: heart rate, cardiac monitor, continuous pulse ox and blood pressure Approach: midline Location: L2-L3 Injection technique: LOR air  Needle:  Needle type: Tuohy  Needle gauge: 17 G Needle length: 9 cm Needle insertion depth: 6 cm Catheter type: closed end flexible Catheter size: 19 Gauge Catheter at skin depth: 12 cm Test dose: negative and Other  Assessment Events: blood not aspirated, injection not painful, no injection resistance and negative IV test  Additional Notes Informed consent obtained prior to proceeding including risk of failure, 1% risk of PDPH, risk of minor discomfort and bruising.  Discussed rare but serious complications including epidural abscess, permanent nerve injury, epidural hematoma.  Discussed alternatives to epidural analgesia and patient desires to proceed.  Timeout performed pre-procedure verifying patient name, procedure, and platelet count.  Patient tolerated procedure well. Reason for block:procedure for pain

## 2018-12-14 NOTE — Lactation Note (Signed)
This note was copied from a baby's chart. Lactation Consultation Note  Patient Name: Samantha Stanton RDEYC'X Date: 12/14/2018 Reason for consult: Early term 54-38.6wks  p2 mother whose infant is now 85 hours old.  This is an ETI at 38+0 weeks.  Mother breast fed her first child for 6 months.  Baby was receiving a bath when I arrived.  Discussed basic breast feeding concepts with mother.  Mother's breasts are large, soft and non tender and nipples are short shafted and intact.  Provided breast shells and a manual pump with instructions for use.  Mother will begin wearing her breast shells after she finishes doing STS.  #24 flange changed to a #27 flange for better fit and comfort.  Informed mother to rub EBM into nipples/areolas after breast feeding.  Encouraged to feed 8-12 times/24 hours or sooner if baby shows feeding cues.  Reviewed feeding cues.  Colostrum container provided for any EBM she obtains with hand expression.  Milk storage times reviewed and finger feeding demonstrated.  Suggested mother awaken baby by three hours if he does not self awaken.  RN in room and stated that he has latched well a few times.    Mom made aware of O/P services, breastfeeding support groups, community resources, and our phone # for post-discharge questions. Mother has a DEBP for home use.  Father present.   Maternal Data Formula Feeding for Exclusion: No Has patient been taught Hand Expression?: Yes Does the patient have breastfeeding experience prior to this delivery?: Yes  Feeding    LATCH Score Latch: Grasps breast easily, tongue down, lips flanged, rhythmical sucking.  Audible Swallowing: A few with stimulation  Type of Nipple: Flat  Comfort (Breast/Nipple): Soft / non-tender  Hold (Positioning): Assistance needed to correctly position infant at breast and maintain latch.  LATCH Score: 7  Interventions    Lactation Tools Discussed/Used Pump Review: Setup, frequency, and  cleaning;Milk Storage Initiated by:: Gayathri Futrell Date initiated:: 12/14/18   Consult Status Consult Status: Follow-up Date: 12/15/18 Follow-up type: In-patient    Junelle Hashemi R Sirus Labrie 12/14/2018, 4:21 PM

## 2018-12-14 NOTE — Discharge Summary (Signed)
OB Discharge Summary     Patient Name: Samantha Stanton DOB: 12-May-1996 MRN: [id number]  Date of admission: 12/12/2018 Delivering MD: Evalina FieldALBERT, CHRISTINA M   Date of discharge: 12/17/2018  Admitting diagnosis: DFM Intrauterine pregnancy: 7190w0d     Secondary diagnosis:  Active Problems:   ADHD (attention deficit hyperactivity disorder)   Abnormal platelet aggregation (HCC)   Preeclampsia, severe, third trimester   SVD (spontaneous vaginal delivery)  Additional problems: none     Discharge diagnosis: Term Pregnancy Delivered and Preeclampsia (severe)                                                                                                Post partum procedures:none  Augmentation: AROM, Pitocin and Cytotec  Complications: None  Hospital course:  Induction of Labor With Vaginal Delivery   23 y.o. yo G3P1011 at 7590w0d was admitted to the hospital 12/12/2018 for induction of labor.  Indication for induction: Preeclampsia.  Patient had an uncomplicated labor course as follows: Membrane Rupture Time/Date: 8:38 PM ,12/13/2018   Intrapartum Procedures: Episiotomy: None [1]                                         Lacerations:  None [1]  Patient had delivery of a Viable infant.  Information for the patient's newborn:  Donnajean LopesBrunson, Boy Yamili [[id number]][[id number]]  Delivery Method: Vag-Spont    12/14/2018  Details of delivery can be found in separate delivery note.  Patient had a routine postpartum course. Patient is discharged home 12/17/18.  Physical exam  Vitals:   12/16/18 1928 12/16/18 2315 12/17/18 0536 12/17/18 0800  BP: 139/79 (!) 150/80 (!) 147/91 (!) 142/84  Pulse: 92 84 84 82  Resp: 18 18 20 18   Temp: 98.6 F (37 C) 98 F (36.7 C) 98.2 F (36.8 C) 97.6 F (36.4 C)  TempSrc: Oral Oral Oral Oral  SpO2: 100% 100% 100% 100%  Weight:      Height:       General: alert, cooperative and no distress Lochia: appropriate Uterine Fundus: firm Incision: N/A DVT Evaluation: No evidence of  DVT seen on physical exam. Labs: Lab Results  Component Value Date   WBC 11.0 (H) 12/14/2018   HGB 12.4 12/14/2018   HCT 37.9 12/14/2018   MCV 87.1 12/14/2018   PLT 237 12/14/2018   CMP Latest Ref Rng & Units 12/12/2018  Glucose 70 - 99 mg/dL 81  BUN 6 - 20 mg/dL <7(W<5(L)  Creatinine 2.950.44 - 1.00 mg/dL 6.210.88  Sodium 308135 - 657145 mmol/L 138  Potassium 3.5 - 5.1 mmol/L 3.0(L)  Chloride 98 - 111 mmol/L 107  CO2 22 - 32 mmol/L 21(L)  Calcium 8.9 - 10.3 mg/dL 9.2  Total Protein 6.5 - 8.1 g/dL 7.1  Total Bilirubin 0.3 - 1.2 mg/dL 0.4  Alkaline Phos 38 - 126 U/L 79  AST 15 - 41 U/L 17  ALT 0 - 44 U/L 14    Discharge instruction: per After Visit Summary and "Baby and Me Booklet".  After visit meds:  Allergies as of 12/17/2018      Reactions   Aspirin Other (See Comments)   Causes nose bleeds and frequent periods - age 63. Saw hematologist and told not to take ASA      Medication List    TAKE these medications   amLODipine 10 MG tablet Commonly known as: NORVASC Take 1 tablet (10 mg total) by mouth daily.   ibuprofen 600 MG tablet Commonly known as: ADVIL Take 1 tablet (600 mg total) by mouth every 6 (six) hours.   prenatal multivitamin Tabs tablet Take 1 tablet by mouth daily at 12 noon.       Diet: low salt diet  Activity: Advance as tolerated. Pelvic rest for 6 weeks.   Outpatient follow up:4 weeks Follow up Appt: Future Appointments  Date Time Provider [place]  12/23/2018  9:20 AM [place]  01/13/2019  8:15 AM Wende Mott, CNM [facility]   Follow up Visit:No follow-ups on file.   Please schedule this patient for Postpartum visit in: 4 weeks with the following provider: Any provider For C/S patients schedule nurse incision check in weeks 2 weeks: no High risk pregnancy complicated by: severe Pre-E, h/o coagulopathy  Delivery mode:  SVD Anticipated Birth Control:  other/unsure PP Procedures needed: BP check  Schedule Integrated  [place] visit: no  Postpartum contraception: None  Newborn Data: Live born female  Birth Weight:   Weight: 2809 g Weight:, English: 6 lb 3.1 oz APGAR: 8, 9  Newborn Delivery   Birth date/time: 12/14/2018 02:52:00 Delivery type: Vaginal, Spontaneous      Baby Feeding: Breast Disposition:home with mother   12/17/2018 Emeterio Reeve, MD

## 2018-12-14 NOTE — Progress Notes (Signed)
LABOR PROGRESS NOTE  Samantha Stanton is a 23 y.o. G3P1011 at [redacted]w[redacted]d admitted for IOL for preeclampsia with severe features.  Subjective: Doing well overall. Experiencing more painful and regular contractions, planning for epidural. Continues to deny headache, vision changes, RUQ pain, or SOB.  Objective: BP (!) 156/69   Pulse 99   Temp 98.4 F (36.9 C) (Axillary)   Resp 18   Ht 5\' 5"  (1.651 m)   Wt 119.3 kg   LMP 03/03/2018 (Approximate) Comment: does not know her LMP  BMI 43.77 kg/m  or  Vitals:   12/14/18 0031 12/14/18 0045 12/14/18 0050 12/14/18 0102  BP: (!) 160/91 (!) 162/95  (!) 156/69  Pulse: 89 87  99  Resp: 18 18  18   Temp:   98.4 F (36.9 C)   TempSrc:   Axillary   Weight:      Height:       Dilation: 3 Effacement (%): 60 Cervical Position: Posterior Station: -2 Presentation: Vertex Exam by:: Dr.Donelda Mailhot FHT: Baseline rate 135-140, moderate varibility, +accel, variable decels Toco: Contractions every 2-5 minutes  Labs: Lab Results  Component Value Date   WBC 11.0 (H) 12/14/2018   HGB 12.4 12/14/2018   HCT 37.9 12/14/2018   MCV 87.1 12/14/2018   PLT 237 12/14/2018    Patient Active Problem List   Diagnosis Date Noted  . Preeclampsia, severe, third trimester 12/12/2018  . ADHD (attention deficit hyperactivity disorder) 11/11/2018  . Menorrhagia 11/11/2018  . Obesity in pregnancy 10/08/2018  . Supervision of high risk pregnancy, antepartum 07/25/2018  . History of gestational hypertension 07/25/2018  . Late prenatal care, antepartum 07/25/2018  . Abnormal platelet aggregation ([place]) 12/24/2012    Assessment / Plan: 23 y.o. G3P1011 at [redacted]w[redacted]d here for IOL for preeclampsia with severe features.  Labor: More painful and regular contractions with Pitocin; continue per protocol. Plan for next cervical exam after epidural placement. Fetal Wellbeing: Category 2 tracing as above; will continue to monitor Pain Control: Epidural pending Anticipated MOD:  SVD Pre-eclampsia with SF: Asymptomatic on mag, recent severe range pressures treated with Labetalol, will continue to monitor.  Vilma Meckel, MD  Family Medicine, PGY-2 12/14/2018, 1:19 AM

## 2018-12-15 NOTE — Progress Notes (Signed)
Post Partum Day 1 Subjective: no complaints, up ad lib, voiding and tolerating PO  Objective: Blood pressure 136/82, pulse 66, temperature 97.9 F (36.6 C), temperature source Oral, resp. rate 17, height 5\' 5"  (1.651 m), weight 119.3 kg, last menstrual period 03/03/2018, SpO2 100 %, unknown if currently breastfeeding.  Physical Exam:  General: alert, cooperative and no distress Lochia: appropriate Uterine Fundus: firm Incision: n/a DVT Evaluation: No evidence of DVT seen on physical exam.  Recent Labs    12/12/18 2158 12/14/18 0057  HGB 12.9 12.4  HCT 39.8 37.9    Assessment/Plan: Plan for discharge tomorrow and Breastfeeding   LOS: 3 days   Emeterio Reeve 12/15/2018, 10:10 AM

## 2018-12-15 NOTE — Plan of Care (Signed)
  Problem: Activity: Goal: Will verbalize the importance of balancing activity with adequate rest periods Outcome: Progressing  Mother encouraged to rest when baby rests, safe sleep practices reiterated.

## 2018-12-15 NOTE — Lactation Note (Signed)
This note was copied from a baby's chart. Lactation Consultation Note  Patient Name: Samantha Stanton MDYJW'L Date: 12/15/2018 Reason for consult: Follow-up assessment;Early term 17-38.6wks  P2 mother whose infant is now 47 hours old.  This is an ETI at 38+0 weeks.  Mother breast fed her first child for 6 months.  Baby was asleep and under bili blanket phototherapy when I arrived.  Goggles were in place.  Mother stated that he has been feeding well.  She denies pain with latching.  Provided breast shells and a manual pump yesterday with instructions.  Mother did not require any review.  When questioned about pumping with the DEBP, mother stated she has not been pumping since I set her up with the pump yesterday.  Again, I reinforced the importance of pumping for breast stimulation and to provide EBM for supplementation for baby.  Mother stated she will "get better" with pumping.  Encouraged her to latch baby to breast first and lay the bili blanket over baby's back after latching.  Reminded her to keep the light on at all times and to feed back any EBM she obtains with pumping and hand expression.  Mother had no questions/concerns at this time but will call me if she needs latch assistance.  Father present.   Maternal Data Formula Feeding for Exclusion: No Has patient been taught Hand Expression?: Yes Does the patient have breastfeeding experience prior to this delivery?: Yes  Feeding    LATCH Score                   Interventions    Lactation Tools Discussed/Used     Consult Status Consult Status: Follow-up Date: 12/16/18 Follow-up type: In-patient    Little Ishikawa 12/15/2018, 12:49 PM

## 2018-12-15 NOTE — Anesthesia Postprocedure Evaluation (Signed)
Anesthesia Post Note  Patient: Samantha Stanton  Procedure(s) Performed: AN AD HOC LABOR EPIDURAL     Patient location during evaluation: Mother Baby Anesthesia Type: Epidural Level of consciousness: awake and alert Pain management: pain level controlled Vital Signs Assessment: post-procedure vital signs reviewed and stable Respiratory status: spontaneous breathing, nonlabored ventilation and respiratory function stable Cardiovascular status: stable Postop Assessment: no headache, no backache and epidural receding Anesthetic complications: no    Last Vitals:  Vitals:   12/15/18 0035 12/15/18 0455  BP: (!) 156/99 136/82  Pulse: 80 66  Resp:  17  Temp:  36.6 C  SpO2:  100%    Last Pain:  Vitals:   12/15/18 0455  TempSrc: Oral  PainSc:                  [place] S

## 2018-12-16 NOTE — Lactation Note (Addendum)
This note was copied from a baby's chart. Lactation Consultation Note  Patient Name: Samantha Stanton JOACZ'Y Date: 12/16/2018 Reason for consult: Follow-up assessment;Infant < 6lbs;Hyperbilirubinemia   Baby on phototherapy with 9.1% weight loss.  7 voids/4 stools in the last 24 hours.  Baby recently bf on L breast for 15 min.  Mother states she does not like pumping w/ manual pump and has not pumped since yesterday.  Set up mother with DEBP with #27 flanges. Mother pumped 5 ml.   Demonstrated how to finger syringe feed. Discussed that when volume increases, they have option of feeding baby with slow flow nipples.  Discussed paced feeding and hands on pumping.  Recommend mother post pump 4-6 times per day for 10-20 min with DEBP on initiation setting. Give baby back volume pumped at the next feeding. Reviewed cleaning and milk storage.  Assisted with latching baby on R breast and placing one of the lights under baby when latching. Mother has personal DEBP at home. Mother stated during consult that she thought that she was feeding baby too much since he spit up.  Discussed how breastmilk helps thin fluid and helps it come up. Continue bf on demand and give extra volume pumped.       Maternal Data    Feeding Feeding Type: Breast Fed  LATCH Score Latch: Grasps breast easily, tongue down, lips flanged, rhythmical sucking.  Audible Swallowing: A few with stimulation  Type of Nipple: Everted at rest and after stimulation  Comfort (Breast/Nipple): Soft / non-tender  Hold (Positioning): Assistance needed to correctly position infant at breast and maintain latch.  LATCH Score: 8  Interventions Interventions: Breast feeding basics reviewed;DEBP  Lactation Tools Discussed/Used Pump Review: Setup, frequency, and cleaning;Milk Storage Initiated by:: Vivianne Master RN IBCLC Date initiated:: 12/16/18   Consult Status Consult Status: Follow-up Date: 12/17/18 Follow-up type:  In-patient    Vivianne Master [facility] 12/16/2018, 9:43 AM

## 2018-12-16 NOTE — Progress Notes (Signed)
Post Partum Day 2 Subjective: no complaints, up ad lib, voiding and tolerating PO  Objective: Blood pressure 137/86, pulse 79, temperature 98.4 F (36.9 C), temperature source Oral, resp. rate 18, height 5\' 5"  (1.651 m), weight 119.3 kg, last menstrual period 03/03/2018, SpO2 100 %, unknown if currently breastfeeding.  Physical Exam:  General: alert, cooperative and no distress Lochia: appropriate Uterine Fundus: firm Incision: n/a DVT Evaluation: No evidence of DVT seen on physical exam.  Recent Labs    12/14/18 0057  HGB 12.4  HCT 37.9    Assessment/Plan: Plan for discharge tomorrow   LOS: 4 days   Emeterio Reeve 12/16/2018, 4:00 PM

## 2018-12-17 MED ORDER — AMLODIPINE BESYLATE 10 MG PO TABS
10.0000 mg | ORAL_TABLET | Freq: Every day | ORAL | Status: DC
  Administered 2018-12-17: 10 mg via ORAL
  Filled 2018-12-17: qty 1

## 2018-12-17 MED ORDER — AMLODIPINE BESYLATE 10 MG PO TABS: 10.0000 mg | ORAL_TABLET | Freq: Every day | ORAL | 1 refills | Status: DC

## 2018-12-17 MED ORDER — IBUPROFEN 600 MG PO TABS: 600.0000 mg | ORAL_TABLET | Freq: Four times a day (QID) | ORAL | 0 refills | Status: DC

## 2018-12-17 NOTE — Lactation Note (Signed)
This note was copied from a baby's chart. Lactation Consultation Note:  Infant is 25 hours old and is under photo tx. Mother unsure if she will be discharged today.   Mother is breastfeeding and pumping. She reports that she pumped 1 ounce after last feeding .  She has been bottle feeding ebm after breastfeeding.  Mother reports that her breast are getting firm .  She was advised in good breast massage technique and suggested to start ice for 15 mins on each breast.   Mother has a [employer] DEBP at home. She is aware that additional pumping will aid in milk coming to volume.  Mother declines need for breastfeeding assistance.   Discussed S/S of Mastitis. Mother receptive . She is aware of available [place] services and all recourses at [facility].   Patient Name: Samantha Stanton RJJOA'C Date: 12/17/2018 Reason for consult: Follow-up assessment   Maternal Data    Feeding Feeding Type: Bottle Fed - Breast Milk  LATCH Score                   Interventions Interventions: Breast massage;Hand pump;DEBP  Lactation Tools Discussed/Used     Consult Status Consult Status: Complete    Darla Lesches 12/17/2018, 9:54 AM

## 2018-12-17 NOTE — Discharge Instructions (Signed)
Preeclampsia and Eclampsia °Preeclampsia is a serious condition that may develop during pregnancy. This condition causes high blood pressure and increased protein in your urine along with other symptoms, such as headaches and vision changes. These symptoms may develop as the condition gets worse. Preeclampsia may occur at 20 weeks of pregnancy or later. °Diagnosing and treating preeclampsia early is very important. If not treated early, it can cause serious problems for you and your baby. One problem it can lead to is eclampsia. Eclampsia is a condition that causes muscle jerking or shaking (convulsions or seizures) and other serious problems for the mother. During pregnancy, delivering your baby may be the best treatment for preeclampsia or eclampsia. For most women, preeclampsia and eclampsia symptoms go away after giving birth. °In rare cases, a woman may develop preeclampsia after giving birth (postpartum preeclampsia). This usually occurs within 48 hours after childbirth but may occur up to 6 weeks after giving birth. °What are the causes? °The cause of preeclampsia is not known. °What increases the risk? °The following risk factors make you more likely to develop preeclampsia: °· Being pregnant for the first time. °· Having had preeclampsia during a past pregnancy. °· Having a family history of preeclampsia. °· Having high blood pressure. °· Being pregnant with more than one baby. °· Being 35 or older. °· Being African-American. °· Having kidney disease or diabetes. °· Having medical conditions such as lupus or blood diseases. °· Being very overweight (obese). °What are the signs or symptoms? °The most common symptoms are: °· Severe headaches. °· Vision problems, such as blurred or double vision. °· Abdominal pain, especially upper abdominal pain. °Other symptoms that may develop as the condition gets worse include: °· Sudden weight gain. °· Sudden swelling of the hands, face, legs, and feet. °· Severe nausea  and vomiting. °· Numbness in the face, arms, legs, and feet. °· Dizziness. °· Urinating less than usual. °· Slurred speech. °· Convulsions or seizures. °How is this diagnosed? °There are no screening tests for preeclampsia. Your health care provider will ask you about symptoms and check for signs of preeclampsia during your prenatal visits. You may also have tests that include: °· Checking your blood pressure. °· Urine tests to check for protein. Your health care provider will check for this at every prenatal visit. °· Blood tests. °· Monitoring your baby's heart rate. °· Ultrasound. °How is this treated? °You and your health care provider will determine the treatment approach that is best for you. Treatment may include: °· Having more frequent prenatal exams to check for signs of preeclampsia, if you have an increased risk for preeclampsia. °· Medicine to lower your blood pressure. °· Staying in the hospital, if your condition is severe. There, treatment will focus on controlling your blood pressure and the amount of fluids in your body (fluid retention). °· Taking medicine (magnesium sulfate) to prevent seizures. This may be given as an injection or through an IV. °· Taking a low-dose aspirin during your pregnancy. °· Delivering your baby early. You may have your labor started with medicine (induced), or you may have a cesarean delivery. °Follow these instructions at home: °Eating and drinking ° °· Drink enough fluid to keep your urine pale yellow. °· Avoid caffeine. °Lifestyle °· Do not use any products that contain nicotine or tobacco, such as cigarettes and e-cigarettes. If you need help quitting, ask your health care provider. °· Do not use alcohol or drugs. °· Avoid stress as much as possible. Rest and get   plenty of sleep. °General instructions °· Take over-the-counter and prescription medicines only as told by your health care provider. °· When lying down, lie on your left side. This keeps pressure off your  major blood vessels. °· When sitting or lying down, raise (elevate) your feet. Try putting some pillows underneath your lower legs. °· Exercise regularly. Ask your health care provider what kinds of exercise are best for you. °· Keep all follow-up and prenatal visits as told by your health care provider. This is important. °How is this prevented? °There is no known way of preventing preeclampsia or eclampsia from developing. However, to lower your risk of complications and detect problems early: °· Get regular prenatal care. Your health care provider may be able to diagnose and treat the condition early. °· Maintain a healthy weight. Ask your health care provider for help managing weight gain during pregnancy. °· Work with your health care provider to manage any long-term (chronic) health conditions you have, such as diabetes or kidney problems. °· You may have tests of your blood pressure and kidney function after giving birth. °· Your health care provider may have you take low-dose aspirin during your next pregnancy. °Contact a health care provider if: °· You have symptoms that your health care provider told you may require more treatment or monitoring, such as: °? Headaches. °? Nausea or vomiting. °? Abdominal pain. °? Dizziness. °? Light-headedness. °Get help right away if: °· You have severe: °? Abdominal pain. °? Headaches that do not get better. °? Dizziness. °? Vision problems. °? Confusion. °? Nausea or vomiting. °· You have any of the following: °? A seizure. °? Sudden, rapid weight gain. °? Sudden swelling in your hands, ankles, or face. °? Trouble moving any part of your body. °? Numbness in any part of your body. °? Trouble speaking. °? Abnormal bleeding. °· You faint. °Summary °· Preeclampsia is a serious condition that may develop during pregnancy. °· This condition causes high blood pressure and increased protein in your urine along with other symptoms, such as headaches and vision  changes. °· Diagnosing and treating preeclampsia early is very important. If not treated early, it can cause serious problems for you and your baby. °· Get help right away if you have symptoms that your health care provider told you to watch for. °This information is not intended to replace advice given to you by your health care provider. Make sure you discuss any questions you have with your health care provider. °Document Released: 05/11/2000 Document Revised: 01/14/2018 Document Reviewed: 12/19/2015 °Elsevier Patient Education © 2020 [employer] ° °

## 2018-12-17 NOTE — Plan of Care (Signed)
Pt  teaching  complete  ready for discharge

## 2018-12-18 ENCOUNTER — Telehealth: Payer: 59 | Admitting: Obstetrics & Gynecology

## 2018-12-23 ENCOUNTER — Ambulatory Visit: Payer: 59

## 2018-12-23 ENCOUNTER — Telehealth: Payer: Self-pay | Admitting: Clinical

## 2018-12-23 NOTE — Telephone Encounter (Signed)
Attempt to follow up with patient; voicemail not set up on phone, so unable to leave phone message.

## 2018-12-25 ENCOUNTER — Telehealth: Payer: 59 | Admitting: Obstetrics & Gynecology

## 2018-12-29 ENCOUNTER — Other Ambulatory Visit: Payer: 59

## 2018-12-29 ENCOUNTER — Encounter: Payer: 59 | Admitting: Obstetrics & Gynecology

## 2019-01-09 ENCOUNTER — Telehealth: Payer: Self-pay | Admitting: *Deleted

## 2019-01-09 DIAGNOSIS — F53 Postpartum depression: Secondary | ICD-10-CM

## 2019-01-09 NOTE — Telephone Encounter (Signed)
Received a voicemail from this am from Samantha Stanton , Nurse with [facility] . She states she did postpartum home visit with [facility] and Samantha Stanton has scored 15 on her [place]. States she does not feel like she is going to harm self or her baby and understands if she begins to feel that way to go to the hospital. States she has referred her to [facility] and [facility].  Samantha Stanton has postpartum  appointment Monday 8/17. Will forward to provider and also order referral to [place] , [facility]  Linda,RN

## 2019-01-13 ENCOUNTER — Telehealth (INDEPENDENT_AMBULATORY_CARE_PROVIDER_SITE_OTHER): Payer: 59

## 2019-01-13 ENCOUNTER — Telehealth: Payer: 59 | Admitting: Clinical

## 2019-01-13 ENCOUNTER — Other Ambulatory Visit: Payer: Self-pay

## 2019-01-13 DIAGNOSIS — F53 Postpartum depression: Secondary | ICD-10-CM

## 2019-01-13 DIAGNOSIS — Z91199 Patient's noncompliance with other medical treatment and regimen due to unspecified reason: Secondary | ICD-10-CM

## 2019-01-13 DIAGNOSIS — Z5329 Procedure and treatment not carried out because of patient's decision for other reasons: Secondary | ICD-10-CM

## 2019-01-13 MED ORDER — FERROUS SULFATE 324 (65 FE) MG PO TBEC: 1.0000 | DELAYED_RELEASE_TABLET | Freq: Two times a day (BID) | ORAL | 1 refills | Status: DC

## 2019-01-13 MED ORDER — DOCUSATE SODIUM 250 MG PO CAPS: 250.0000 mg | ORAL_CAPSULE | Freq: Every day | ORAL | 1 refills | Status: DC

## 2019-01-13 NOTE — BH Specialist Note (Signed)
Pt did not arrive to video visit and did not answer the phone; Left HIPPA-compliant message to call back Roselyn Reef from [facility] at [phone], and left MyChart message for patient.   [place] via Telemedicine Video Visit  01/13/2019 Daven Pinckney [phone]  [place] Fair

## 2019-01-13 NOTE — Progress Notes (Signed)
TELEHEALTH POSTPARTUM VIRTUAL VIDEO VISIT ENCOUNTER NOTE   Provider location: [facility] [employer] at [facility]   I connected with Samantha Stanton on 01/13/19 at  8:15 AM EDT by MyChart Video Encounter at home and verified that I am speaking with the correct person using two identifiers.    I discussed the limitations, risks, security and privacy concerns of performing an evaluation and management service virtually and the availability of in person appointments. I also discussed with the patient that there may be a patient responsible charge related to this service. The patient expressed understanding and agreed to proceed.  Chief Complaint: Postpartum Visit  History of Present Illness: Samantha Stanton is a 23 y.o. African-American [id number] being evaluated for postpartum followup.    She is s/p SVD on 12/14/2018 at 38w weeks; she was discharged to home on 12/17/2018. Pregnancy complicated by severe preeclampsia and postpartum depression. Baby is doing well.  Complains of feeling sluggish and tired. Still experiencing symptoms of depression.  Vaginal bleeding or discharge: Yes  Intercourse: No  Contraception: no method Mode of feeding infant: Breast PP depression s/s: Yes .  Any bowel or bladder issues: Yes  Pap smear: no abnormalities (date: 08/08/2018)  Review of Systems: Positive for depression. Her 12 point review of systems is negative or as noted in the History of Present Illness.  Patient Active Problem List   Diagnosis Date Noted  . SVD (spontaneous vaginal delivery) 12/14/2018  . Preeclampsia, severe, third trimester 12/12/2018  . ADHD (attention deficit hyperactivity disorder) 11/11/2018  . Menorrhagia 11/11/2018  . Supervision of high risk pregnancy, antepartum 07/25/2018  . History of gestational hypertension 07/25/2018  . Abnormal platelet aggregation ([place]) 12/24/2012    Medications Alene Glennie had no medications administered during this visit. Current  Outpatient Medications  Medication Sig Dispense Refill  . Prenatal Vit-Fe Fumarate-FA (PRENATAL MULTIVITAMIN) TABS tablet Take 1 tablet by mouth daily at 12 noon.    Marland Kitchen amLODipine (NORVASC) 10 MG tablet Take 1 tablet (10 mg total) by mouth daily. (Patient not taking: Reported on 01/13/2019) 30 tablet 1  . ibuprofen (ADVIL) 600 MG tablet Take 1 tablet (600 mg total) by mouth every 6 (six) hours. (Patient not taking: Reported on 01/13/2019) 30 tablet 0   No current facility-administered medications for this visit.     Allergies Aspirin  Physical Exam:  BP 120/75   Pulse 90   General:  Alert, oriented and cooperative. Patient is in no acute distress.  Mental Status: Normal mood and affect. Normal behavior. Normal judgment and thought content.   Respiratory: Normal respiratory effort noted, no problems with respiration noted  Rest of physical exam deferred due to type of encounter  PP Depression Screening:   Edinburgh Postnatal Depression Scale Screening Tool 01/13/2019 12/15/2018 12/15/2018  I have been able to laugh and see the funny side of things. 0 0 (No Data)  I have looked forward with enjoyment to things. 0 0 -  I have blamed myself unnecessarily when things went wrong. 2 1 -  I have been anxious or worried for no good reason. 2 2 -  I have felt scared or panicky for no good reason. 3 1 -  Things have been getting on top of me. 1 1 -  I have been so unhappy that I have had difficulty sleeping. 0 1 -  I have felt sad or miserable. 1 1 -  I have been so unhappy that I have been crying. 2 1 -  The thought of harming myself has occurred to me. 0 0 -  Edinburgh Postnatal Depression Scale Total 11 8 -     Assessment:Patient is a 23 y.o. [id number] who is 4 weeks postpartum from a normal spontaneous vaginal delivery.  She is doing well.   Plan:  1. Postpartum care and examination -BP normal -Patient concerned her iron is low. Discussed that symptoms could also be related to PP  depression. Will send RX for iron and stool softener. -Patient declines contraception. States FOB is travelling for work and wont be around for a while. Encouraged patient to call office if she decides she needs contraception in the future. -Last pap 3/20 and normal. Discussed normal timing of health maintenance.   2. Postpartum depression -Denies any SI/HI. States she has had depression in the past and knew it would be worse PP. -Patient reports visits with Asher MuirJamie are helping. Has appt scheduled today. She has also scheduled appointments with [facility]. Encouraged patient to also call [facility] as recommended for support with baby. -Encouraged patient to call office if she decides she wants medical management for PP depression.   RTC today for visit with Asher MuirJamie and in 1 year for annual exam  I discussed the assessment and treatment plan with the patient. The patient was provided an opportunity to ask questions and all were answered. The patient agreed with the plan and demonstrated an understanding of the instructions.   The patient was advised to call back or seek an in-person evaluation/go to the ED for any concerning postpartum symptoms.  I provided 25 minutes of face-to-face time during this encounter.   Rolm Bookbinderaroline M Ladarius Seubert, CNM [facility], [facility]

## 2019-05-25 ENCOUNTER — Inpatient Hospital Stay (HOSPITAL_COMMUNITY): Payer: 59

## 2019-05-25 ENCOUNTER — Encounter (HOSPITAL_COMMUNITY): Payer: Self-pay | Admitting: Obstetrics and Gynecology

## 2019-05-25 ENCOUNTER — Other Ambulatory Visit: Payer: Self-pay

## 2019-05-25 ENCOUNTER — Inpatient Hospital Stay (HOSPITAL_COMMUNITY)
Admission: AD | Admit: 2019-05-25 | Discharge: 2019-05-25 | Disposition: A | Discharge status: Home / Self Care | Payer: 59 | Attending: Obstetrics and Gynecology | Admitting: Obstetrics and Gynecology

## 2019-05-25 DIAGNOSIS — O26891 Other specified pregnancy related conditions, first trimester: Secondary | ICD-10-CM | POA: Insufficient documentation

## 2019-05-25 DIAGNOSIS — O209 Hemorrhage in early pregnancy, unspecified: Secondary | ICD-10-CM | POA: Insufficient documentation

## 2019-05-25 DIAGNOSIS — Z886 Allergy status to analgesic agent status: Secondary | ICD-10-CM | POA: Insufficient documentation

## 2019-05-25 DIAGNOSIS — Z3490 Encounter for supervision of normal pregnancy, unspecified, unspecified trimester: Secondary | ICD-10-CM

## 2019-05-25 DIAGNOSIS — R109 Unspecified abdominal pain: Secondary | ICD-10-CM

## 2019-05-25 DIAGNOSIS — Z3A12 12 weeks gestation of pregnancy: Secondary | ICD-10-CM

## 2019-05-25 DIAGNOSIS — Z3A08 8 weeks gestation of pregnancy: Secondary | ICD-10-CM | POA: Diagnosis not present

## 2019-05-25 LAB — COMPREHENSIVE METABOLIC PANEL
ALT: 23 U/L (ref 0–44)
AST: 15 U/L (ref 15–41)
Albumin: 3.3 g/dL — ABNORMAL LOW (ref 3.5–5.0)
Alkaline Phosphatase: 31 U/L — ABNORMAL LOW (ref 38–126)
Anion gap: 7 (ref 5–15)
BUN: 5 mg/dL — ABNORMAL LOW (ref 6–20)
CO2: 25 mmol/L (ref 22–32)
Calcium: 9.2 mg/dL (ref 8.9–10.3)
Chloride: 103 mmol/L (ref 98–111)
Creatinine, Ser: 0.65 mg/dL (ref 0.44–1.00)
GFR calc Af Amer: >60 mL/min (ref >60)
GFR calc non Af Amer: >60 mL/min (ref >60)
Glucose, Bld: 108 mg/dL — ABNORMAL HIGH (ref 70–99)
Potassium: 3.9 mmol/L (ref 3.5–5.1)
Sodium: 135 mmol/L (ref 135–145)
Total Bilirubin: 0.3 mg/dL (ref 0.3–1.2)
Total Protein: 7 g/dL (ref 6.5–8.1)

## 2019-05-25 LAB — CBC
HCT: 38.6 % (ref 36.0–46.0)
Hemoglobin: 12.7 g/dL (ref 12.0–15.0)
MCH: 28.4 pg (ref 26.0–34.0)
MCHC: 32.9 g/dL (ref 30.0–36.0)
MCV: 86.4 fL (ref 80.0–100.0)
Platelets: 288 10*3/uL (ref 150–400)
RBC: 4.47 MIL/uL (ref 3.87–5.11)
RDW: 13.5 % (ref 11.5–15.5)
WBC: 6.6 10*3/uL (ref 4.0–10.5)
nRBC: 0 % (ref 0.0–0.2)

## 2019-05-25 LAB — URINALYSIS, ROUTINE W REFLEX MICROSCOPIC
Bilirubin Urine: NEGATIVE
Glucose, UA: NEGATIVE mg/dL
Hgb urine dipstick: NEGATIVE
Ketones, ur: NEGATIVE mg/dL
Nitrite: NEGATIVE
Protein, ur: 30 mg/dL — AB
Specific Gravity, Urine: 1.026 (ref 1.005–1.030)
pH: 7 (ref 5.0–8.0)

## 2019-05-25 LAB — POCT PREGNANCY, URINE: Preg Test, Ur: POSITIVE — AB

## 2019-05-25 LAB — HCG, QUANTITATIVE, PREGNANCY: hCG, Beta Chain, Quant, S: 91610 m[IU]/mL — ABNORMAL HIGH (ref <5)

## 2019-05-25 LAB — WET PREP, GENITAL
Clue Cells Wet Prep HPF POC: NONE SEEN
Sperm: NONE SEEN
Trich, Wet Prep: NONE SEEN
Yeast Wet Prep HPF POC: NONE SEEN

## 2019-05-25 NOTE — Discharge Instructions (Signed)
-pelvic rest for 4 weeks  Subchorionic Hematoma  A subchorionic hematoma is a gathering of blood between the outer wall of the embryo (chorion) and the inner wall of the womb (uterus). This condition can cause vaginal bleeding. If they cause little or no vaginal bleeding, early small hematomas usually shrink on their own and do not affect your baby or pregnancy. When bleeding starts later in pregnancy, or if the hematoma is larger or occurs in older pregnant women, the condition may be more serious. Larger hematomas may get bigger, which increases the chances of miscarriage. This condition also increases the risk of:  Premature separation of the placenta from the uterus.  Premature (preterm) labor.  Stillbirth. What are the causes? The exact cause of this condition is not known. It occurs when blood is trapped between the placenta and the uterine wall because the placenta has separated from the original site of implantation. What increases the risk? You are more likely to develop this condition if:  You were treated with fertility medicines.  You conceived through in vitro fertilization (IVF). What are the signs or symptoms? Symptoms of this condition include:  Vaginal spotting or bleeding.  Contractions of the uterus. These cause abdominal pain. Sometimes you may have no symptoms and the bleeding may only be seen when ultrasound images are taken (transvaginal ultrasound). How is this diagnosed? This condition is diagnosed based on a physical exam. This includes a pelvic exam. You may also have other tests, including:  Blood tests.  Urine tests.  Ultrasound of the abdomen. How is this treated? Treatment for this condition can vary. Treatment may include:  Watchful waiting. You will be monitored closely for any changes in bleeding. During this stage: ? The hematoma may be reabsorbed by the body. ? The hematoma may separate the fluid-filled space containing the embryo  (gestational sac) from the wall of the womb (endometrium).  Medicines.  Activity restriction. This may be needed until the bleeding stops. Follow these instructions at home:  Stay on bed rest if told to do so by your health care provider.  Do not lift anything that is heavier than 10 lbs. (4.5 kg) or as told by your health care provider.  Do not use any products that contain nicotine or tobacco, such as cigarettes and e-cigarettes. If you need help quitting, ask your health care provider.  Track and write down the number of pads you use each day and how soaked (saturated) they are.  Do not use tampons.  Keep all follow-up visits as told by your health care provider. This is important. Your health care provider may ask you to have follow-up blood tests or ultrasound tests or both. Contact a health care provider if:  You have any vaginal bleeding.  You have a fever. Get help right away if:  You have severe cramps in your stomach, back, abdomen, or pelvis.  You pass large clots or tissue. Save any tissue for your health care provider to look at.  You have more vaginal bleeding, and you faint or become lightheaded or weak. Summary  A subchorionic hematoma is a gathering of blood between the outer wall of the placenta and the uterus.  This condition can cause vaginal bleeding.  Sometimes you may have no symptoms and the bleeding may only be seen when ultrasound images are taken.  Treatment may include watchful waiting, medicines, or activity restriction. This information is not intended to replace advice given to you by your health care provider. Make  sure you discuss any questions you have with your health care provider. Document Released: 08/29/2006 Document Revised: 04/26/2017 Document Reviewed: 07/10/2016 Elsevier Patient Education  2020 [employer].

## 2019-05-25 NOTE — MAU Note (Signed)
+  HPTs a couple wks ago. Last night felt a little achy, 0400 woke up with abd pain.  Took a little Tylenol, didn't really help. Still cramping. No bleeding.

## 2019-05-25 NOTE — MAU Provider Note (Signed)
[name] is a 23 y.o.  [phone] At [redacted]w[redacted]d by certain LMP here with complaints of abdominal pain that started last night and got worse this morning.  She denies vaginal bleeding, abnormal discharge, dysuria, pelvic pain. She denies contractions. She endorses constipation. She recently had an NSVD in 11/2018.   History     CSN: [id number]  Arrival date and time: 05/25/19 1209   First Provider Initiated Contact with Patient 05/25/19 1259      No chief complaint on file.  Abdominal Pain This is a new problem. The current episode started yesterday. The onset quality is sudden. The pain is located in the suprapubic region. The pain is at a severity of 7/10. The quality of the pain is cramping. The abdominal pain does not radiate. Relieved by: feels a little better after she urinates. She has tried acetaminophen for the symptoms.    OB History    Gravida  4   Para  2   Term  2   Preterm      AB  1   Living  2     SAB  1   TAB      Ectopic      Multiple  0   Live Births  2           Past Medical History:  Diagnosis Date  . Bleeding disorder (HCC)    Cannot take ASA  . Chlamydia infection affecting pregnancy 2013  . Hypertension    Gestational HTN  . Pregnancy induced hypertension    with last pregnancy    Past Surgical History:  Procedure Laterality Date  . WISDOM TOOTH EXTRACTION      Family History  Problem Relation Age of Onset  . Diabetes Mother   . Hypertension Mother   . Cancer Sister        Leukemia    Social History   Tobacco Use  . Smoking status: Never Smoker  . Smokeless tobacco: Never Used  Substance Use Topics  . Alcohol use: Not Currently  . Drug use: No    Allergies:  Allergies  Allergen Reactions  . Aspirin Other (See Comments)    Causes nose bleeds and frequent periods - age 60. Saw hematologist and told not to take ASA    Medications Prior to Admission  Medication Sig Dispense Refill Last Dose  . amLODipine  (NORVASC) 10 MG tablet Take 1 tablet (10 mg total) by mouth daily. (Patient not taking: Reported on 01/13/2019) 30 tablet 1   . docusate sodium (COLACE) 250 MG capsule Take 1 capsule (250 mg total) by mouth daily. 30 capsule 1   . ferrous sulfate 324 (65 Fe) MG TBEC Take 1 tablet (325 mg total) by mouth 2 (two) times daily. 30 tablet 1   . ibuprofen (ADVIL) 600 MG tablet Take 1 tablet (600 mg total) by mouth every 6 (six) hours. (Patient not taking: Reported on 01/13/2019) 30 tablet 0   . Prenatal Vit-Fe Fumarate-FA (PRENATAL MULTIVITAMIN) TABS tablet Take 1 tablet by mouth daily at 12 noon.       Review of Systems  Constitutional: Negative.   HENT: Negative.   Gastrointestinal: Positive for abdominal pain.  Genitourinary: Negative.   Musculoskeletal: Negative.   Neurological: Negative.    Physical Exam   Blood pressure (!) 128/57, pulse 89, temperature 98.7 F (37.1 C), temperature source Oral, resp. rate 18, height 5\' 5"  (1.651 m), weight 107.1 kg, last menstrual period 02/28/2019, SpO2 100 %, unknown  if currently breastfeeding.  Physical Exam  Constitutional: She is oriented to person, place, and time. She appears well-developed and well-nourished.  HENT:  Head: Normocephalic.  Respiratory: Effort normal.  GI: Soft.  Genitourinary:    Genitourinary Comments: NEFG; no pelvic tenderness, no CMT, suprapubic pain or adnexal tenderness.    Musculoskeletal:        General: Normal range of motion.     Cervical back: Normal range of motion.  Neurological: She is alert and oriented to person, place, and time.  Skin: Skin is warm and dry.  Psychiatric: She has a normal mood and affect.    MAU Course  Procedures  MDM -beta hcg is pending -US shows SIUP at 8 weeks 4 days with cardiac activity and small subchorionic hemorrhage.  -GC CT is pending -Wet prep is negative  -Blood type is A pos Assessment and Plan   1. Intrauterine pregnancy   2. Abdominal pain    -Patient stable for  discharge with plans to start prenatal at [facility].  -Reviewed bleeding precautions with subchorionic hemorrhage. Recommend pelvic rest for four weeks.  -Return to MAU if her condition worsens or changes.  -All questions answered   Mervyn Skeeters [facility] 05/25/2019, 1:45 PM

## 2019-05-27 LAB — GC/CHLAMYDIA PROBE AMP (~~LOC~~) NOT AT ARMC
Chlamydia: NEGATIVE
Comment: NEGATIVE
Comment: NORMAL
Neisseria Gonorrhea: NEGATIVE

## 2019-06-10 ENCOUNTER — Ambulatory Visit (INDEPENDENT_AMBULATORY_CARE_PROVIDER_SITE_OTHER): Payer: 59 | Admitting: *Deleted

## 2019-06-10 ENCOUNTER — Other Ambulatory Visit: Payer: Self-pay

## 2019-06-10 DIAGNOSIS — D691 Qualitative platelet defects: Secondary | ICD-10-CM

## 2019-06-10 DIAGNOSIS — O99210 Obesity complicating pregnancy, unspecified trimester: Secondary | ICD-10-CM

## 2019-06-10 DIAGNOSIS — O0990 Supervision of high risk pregnancy, unspecified, unspecified trimester: Secondary | ICD-10-CM | POA: Insufficient documentation

## 2019-06-10 DIAGNOSIS — O09899 Supervision of other high risk pregnancies, unspecified trimester: Secondary | ICD-10-CM | POA: Insufficient documentation

## 2019-06-10 DIAGNOSIS — O09299 Supervision of pregnancy with other poor reproductive or obstetric history, unspecified trimester: Secondary | ICD-10-CM | POA: Insufficient documentation

## 2019-06-10 MED ORDER — BLOOD PRESSURE KIT DEVI: 1.0000 | 0 refills | Status: DC | PRN

## 2019-06-10 NOTE — Progress Notes (Signed)
Chart reviewed - agree with CMA/RN documentation.  ° °

## 2019-06-10 NOTE — Patient Instructions (Signed)

## 2019-06-10 NOTE — Progress Notes (Signed)
I connected with  Samantha Stanton on 06/10/19 at  1:30 PM EST by telephone and verified that I am speaking with the correct person using two identifiers.   I discussed the limitations, risks, security and privacy concerns of performing an evaluation and management service by telephone and the availability of in person appointments. I also discussed with the patient that there may be a patient responsible charge related to this service. The patient expressed understanding and agreed to proceed.  Explained I am completing her New OB Intake today. We discussed Her EDD and that it is based on  Early [place]. I reviewed her allergies, meds, OB History, Medical /Surgical history, and appropriate screenings. She does report hx anxiety - I offered appointment with [facility] which she declined at present. She states she has her own theraphist thru [employer]. I explained she can request appointment with Asher Muir if needed throughout her pregnancy. I explained I will send her the [employer] app- app sent to her while on phone.  She did not receive the email- I informed her I will contact [employer]- that since she had a recent pregnancy and had [employer]- they will need to send it to her. I informed her we will again have her take her blood pressue weekly- she informed me her blood pressure cuff wasn't working.   I explained we will send a blood pressure cuff prescription to [facility] that will fill that prescription . We called [facility] together and she verified her information- she asked that they deliver to the office. I informed her at her first ob appointment we willshow her how to use it.I explained  then we will have her take her blood pressure weekly and enter into the app. Explained she will have some visits in office and some virtually. She already has Sports coach. I reviewed her new ob  appointment date/ time with her , our location and to wear mask, no visitors.  I explained she will have a pelvic  exam, ob bloodwork, hemoglobin a1C, cbg , genetic testing if desired,- she does want a panorama, I scheduled an [place] at 19 weeks and gave her the appointment. She voices understanding.  Ivet Guerrieri,RN 06/10/2019  1:28 PM

## 2019-06-15 ENCOUNTER — Ambulatory Visit (INDEPENDENT_AMBULATORY_CARE_PROVIDER_SITE_OTHER): Payer: 59 | Admitting: Family Medicine

## 2019-06-15 ENCOUNTER — Encounter: Payer: Self-pay | Admitting: Family Medicine

## 2019-06-15 ENCOUNTER — Other Ambulatory Visit (HOSPITAL_COMMUNITY)
Admission: RE | Admit: 2019-06-15 | Discharge: 2019-06-15 | Disposition: A | Discharge status: Home / Self Care | Payer: 59 | Source: Ambulatory Visit | Attending: Family Medicine | Admitting: Family Medicine

## 2019-06-15 ENCOUNTER — Other Ambulatory Visit: Payer: Self-pay

## 2019-06-15 VITALS — BP 125/77 | HR 100 | Wt 238.6 lb

## 2019-06-15 DIAGNOSIS — O0990 Supervision of high risk pregnancy, unspecified, unspecified trimester: Secondary | ICD-10-CM

## 2019-06-15 DIAGNOSIS — O09299 Supervision of pregnancy with other poor reproductive or obstetric history, unspecified trimester: Secondary | ICD-10-CM

## 2019-06-15 DIAGNOSIS — O0991 Supervision of high risk pregnancy, unspecified, first trimester: Secondary | ICD-10-CM

## 2019-06-15 DIAGNOSIS — M899 Disorder of bone, unspecified: Secondary | ICD-10-CM

## 2019-06-15 DIAGNOSIS — O09291 Supervision of pregnancy with other poor reproductive or obstetric history, first trimester: Secondary | ICD-10-CM

## 2019-06-15 DIAGNOSIS — O09899 Supervision of other high risk pregnancies, unspecified trimester: Secondary | ICD-10-CM

## 2019-06-15 DIAGNOSIS — Z3A11 11 weeks gestation of pregnancy: Secondary | ICD-10-CM

## 2019-06-15 NOTE — Progress Notes (Signed)
Subjective:  Samantha Stanton is a [id number] [redacted]w[redacted]d by 8wk [place], being seen today for her first obstetrical visit.  Her obstetrical history is significant for short interval between pregnancies, preeclampsia.. Patient does intend to breast feed - she successfully breastfed last pregnancy and is currently breastfeeding. Pregnancy history fully reviewed.  Patient reports pubic bone pain.  BP 125/77   Pulse 100   Wt 238 lb 9.6 oz (108.2 kg)   LMP 02/28/2019   BMI 39.71 kg/m   HISTORY: OB History  Gravida Para Term Preterm AB Living  4 2 2   1 2   SAB TAB Ectopic Multiple Live Births  1     0 2    # Outcome Date GA Lbr Len/2nd Weight Sex Delivery Anes PTL Lv  4 Current           3 Term 12/14/18 [redacted]w[redacted]d 06:05 / 00:09 6 lb 3.1 oz (2.809 kg) M Vag-Spont EPI  LIV     Birth Comments: HTN, preeclampsia  2 SAB 04/17/17 [redacted]w[redacted]d    SAB        Birth Comments: had D&C  1 Term 08/25/13 [redacted]w[redacted]d  7 lb 11 oz (3.487 kg) M Vag-Spont EPI N LIV     Complications: Gestational hypertension    Past Medical History:  Diagnosis Date  . Anxiety   . Bleeding disorder ([place])    Cannot take ASA  . Chlamydia infection affecting pregnancy 2013  . Hypertension    Gestational HTN  . Pregnancy induced hypertension    with last pregnancy    Past Surgical History:  Procedure Laterality Date  . DILATION AND CURETTAGE OF UTERUS    . WISDOM TOOTH EXTRACTION      Family History  Problem Relation Age of Onset  . Diabetes Mother   . Hypertension Mother   . Cancer Sister        Leukemia     Exam  BP 125/77   Pulse 100   Wt 238 lb 9.6 oz (108.2 kg)   LMP 02/28/2019   BMI 39.71 kg/m   Chaperone present during exam  CONSTITUTIONAL: Well-developed, well-nourished female in no acute distress.  HENT:  Normocephalic, atraumatic, External right and left ear normal. Oropharynx is clear and moist EYES: Conjunctivae and EOM are normal. Pupils are equal, round, and reactive to light. No scleral icterus.  NECK: Normal  range of motion, supple, no masses.  Normal thyroid.  CARDIOVASCULAR: Normal heart rate noted, regular rhythm RESPIRATORY: Clear to auscultation bilaterally. Effort and breath sounds normal, no problems with respiration noted. BREASTS: not done. ABDOMEN: Soft, normal bowel sounds, no distention noted.  No tenderness, rebound or guarding.  PELVIC: Had recent PAP within last year MUSCULOSKELETAL: Normal range of motion. No tenderness.  No cyanosis, clubbing, or edema.  2+ distal pulses. SKIN: Skin is warm and dry. No rash noted. Not diaphoretic. No erythema. No pallor. NEUROLOGIC: Alert and oriented to person, place, and time. Normal reflexes, muscle tone coordination. No cranial nerve deficit noted. PSYCHIATRIC: Normal mood and affect. Normal behavior. Normal judgment and thought content.    Assessment:    Pregnancy: [id number] Patient Active Problem List   Diagnosis Date Noted  . Supervision of high risk pregnancy, antepartum 06/10/2019  . History of pre-eclampsia in prior pregnancy, currently pregnant 06/10/2019  . Short interval between pregnancies affecting pregnancy, antepartum 06/10/2019  . Obesity in pregnancy 06/10/2019  . Intrauterine pregnancy 05/25/2019  . ADHD (attention deficit hyperactivity disorder) 11/11/2018  . Menorrhagia 11/11/2018  . Abnormal  platelet aggregation (HCC) 12/24/2012      Plan:   1. Supervision of high risk pregnancy, antepartum FHT normal Panorama desired  - Genetic Screening - Obstetric Panel, Including HIV - Hemoglobin A1c - Culture, OB Urine - GC/Chlamydia probe amp ([place])not at [facility]  2. History of pre-eclampsia in prior pregnancy, currently pregnant Start ASA 81mg  at 12 weeks. UP:C, CMP already done. - Protein / creatinine ratio, urine  3. Short interval between pregnancies affecting pregnancy, antepartum Discussed increased risk of PTL, SGA baby, PP depression.  4. Pubic bone pain Isometric exercises demonstrated. If not  improving, may need PT.    Problem list reviewed and updated. 75% of 30 min visit spent on counseling and coordination of care.     06/15/2019

## 2019-06-15 NOTE — Progress Notes (Signed)
Pelvic pain  Delivered last July

## 2019-06-16 LAB — OBSTETRIC PANEL, INCLUDING HIV
Antibody Screen: NEGATIVE
Basophils Absolute: 0 10*3/uL (ref 0.0–0.2)
Basos: 1 %
EOS (ABSOLUTE): 0.1 10*3/uL (ref 0.0–0.4)
Eos: 1 %
HIV Screen 4th Generation wRfx: NONREACTIVE
Hematocrit: 39.1 % (ref 34.0–46.6)
Hemoglobin: 12.8 g/dL (ref 11.1–15.9)
Hepatitis B Surface Ag: NEGATIVE
Immature Grans (Abs): 0 10*3/uL (ref 0.0–0.1)
Immature Granulocytes: 0 %
Lymphocytes Absolute: 2 10*3/uL (ref 0.7–3.1)
Lymphs: 33 %
MCH: 27.5 pg (ref 26.6–33.0)
MCHC: 32.7 g/dL (ref 31.5–35.7)
MCV: 84 fL (ref 79–97)
Monocytes Absolute: 0.3 10*3/uL (ref 0.1–0.9)
Monocytes: 6 %
Neutrophils Absolute: 3.6 10*3/uL (ref 1.4–7.0)
Neutrophils: 59 %
Platelets: 282 10*3/uL (ref 150–450)
RBC: 4.66 x10E6/uL (ref 3.77–5.28)
RDW: 12.9 % (ref 11.7–15.4)
RPR Ser Ql: NONREACTIVE
Rh Factor: POSITIVE
Rubella Antibodies, IGG: 4.08 index (ref >0.99)
WBC: 6 10*3/uL (ref 3.4–10.8)

## 2019-06-16 LAB — PROTEIN / CREATININE RATIO, URINE
Creatinine, Urine: 172.1 mg/dL
Protein, Ur: 10.7 mg/dL
Protein/Creat Ratio: 62 mg/g creat (ref 0–200)

## 2019-06-16 LAB — GC/CHLAMYDIA PROBE AMP (~~LOC~~) NOT AT ARMC
Chlamydia: NEGATIVE
Comment: NEGATIVE
Comment: NORMAL
Neisseria Gonorrhea: NEGATIVE

## 2019-06-16 LAB — HEMOGLOBIN A1C
Est. average glucose Bld gHb Est-mCnc: 103 mg/dL
Hgb A1c MFr Bld: 5.2 % (ref 4.8–5.6)

## 2019-06-17 LAB — CULTURE, OB URINE

## 2019-06-17 LAB — URINE CULTURE, OB REFLEX

## 2019-06-30 ENCOUNTER — Encounter: Payer: Self-pay | Admitting: *Deleted

## 2019-07-13 ENCOUNTER — Telehealth (INDEPENDENT_AMBULATORY_CARE_PROVIDER_SITE_OTHER): Payer: Medicaid Other | Admitting: Family Medicine

## 2019-07-13 DIAGNOSIS — O99210 Obesity complicating pregnancy, unspecified trimester: Secondary | ICD-10-CM

## 2019-07-13 DIAGNOSIS — Z3A15 15 weeks gestation of pregnancy: Secondary | ICD-10-CM

## 2019-07-13 DIAGNOSIS — O09292 Supervision of pregnancy with other poor reproductive or obstetric history, second trimester: Secondary | ICD-10-CM

## 2019-07-13 DIAGNOSIS — O09899 Supervision of other high risk pregnancies, unspecified trimester: Secondary | ICD-10-CM

## 2019-07-13 DIAGNOSIS — O0990 Supervision of high risk pregnancy, unspecified, unspecified trimester: Secondary | ICD-10-CM

## 2019-07-13 DIAGNOSIS — O0992 Supervision of high risk pregnancy, unspecified, second trimester: Secondary | ICD-10-CM

## 2019-07-13 DIAGNOSIS — M899 Disorder of bone, unspecified: Secondary | ICD-10-CM

## 2019-07-13 DIAGNOSIS — O09299 Supervision of pregnancy with other poor reproductive or obstetric history, unspecified trimester: Secondary | ICD-10-CM

## 2019-07-13 DIAGNOSIS — O99212 Obesity complicating pregnancy, second trimester: Secondary | ICD-10-CM

## 2019-07-13 DIAGNOSIS — O09892 Supervision of other high risk pregnancies, second trimester: Secondary | ICD-10-CM

## 2019-07-13 NOTE — Progress Notes (Signed)
I connected with  Trishna Strieter on 07/13/19 at  4:15 PM EST by telephone and verified that I am speaking with the correct person using two identifiers.   I discussed the limitations, risks, security and privacy concerns of performing an evaluation and management service by telephone and the availability of in person appointments. I also discussed with the patient that there may be a patient responsible charge related to this service. The patient expressed understanding and agreed to proceed.  Janene Madeira Zackrey Dyar, CMA 07/13/2019  4:00 PM

## 2019-07-13 NOTE — Progress Notes (Signed)
   TELEHEALTH OBSTETRICS PRENATAL VIRTUAL VIDEO VISIT ENCOUNTER NOTE  Provider location: [facility] for [employer] [facility] [place]   I connected with Lillard Anes on 07/13/19 at  4:15 PM EST by MyChart Video Encounter at home and verified that I am speaking with the correct person using two identifiers.   I discussed the limitations, risks, security and privacy concerns of performing an evaluation and management service virtually and the availability of in person appointments. I also discussed with the patient that there may be a patient responsible charge related to this service. The patient expressed understanding and agreed to proceed. Subjective:  Samantha Stanton is a 24 y.o. S0F0932 at [redacted]w[redacted]d being seen today for ongoing prenatal care.  She is currently monitored for the following issues for this high-risk pregnancy and has ADHD (attention deficit hyperactivity disorder); Abnormal platelet aggregation (HCC); Menorrhagia; Intrauterine pregnancy; Supervision of high risk pregnancy, antepartum; History of pre-eclampsia in prior pregnancy, currently pregnant; Short interval between pregnancies affecting pregnancy, antepartum; and Obesity in pregnancy on their problem list.  Patient reports continues to have pubic bone pain, which is worse.  Contractions: Not present. Vag. Bleeding: None.  Movement: Absent. Denies any leaking of fluid.   The following portions of the patient's history were reviewed and updated as appropriate: allergies, current medications, past family history, past medical history, past social history, past surgical history and problem list.   Objective:  There were no vitals filed for this visit.  Fetal Status:     Movement: Absent     General:  Alert, oriented and cooperative. Patient is in no acute distress.  Respiratory: Normal respiratory effort, no problems with respiration noted  Mental Status: Normal mood and affect. Normal behavior. Normal judgment and thought content.   Rest of physical exam deferred due to type of encounter  Imaging: No results found.  Assessment and Plan:  Pregnancy: [id number] at [redacted]w[redacted]d 1. Supervision of high risk pregnancy, antepartum Good fetal movement  2. History of pre-eclampsia in prior pregnancy, currently pregnant On ASA 81mg  daily  3. Short interval between pregnancies affecting pregnancy, antepartum  4. Obesity in pregnancy  5. Pubic bone pain Refer to PT. - Ambulatory referral to Physical Therapy  Preterm labor symptoms and general obstetric precautions including but not limited to vaginal bleeding, contractions, leaking of fluid and fetal movement were reviewed in detail with the patient. I discussed the assessment and treatment plan with the patient. The patient was provided an opportunity to ask questions and all were answered. The patient agreed with the plan and demonstrated an understanding of the instructions. The patient was advised to call back or seek an in-person office evaluation/go to [facility]U at [facility] for any urgent or concerning symptoms. Please refer to After Visit Summary for other counseling recommendations.   I provided 11 minutes of face-to-face time during this encounter.  No follow-ups on file.  Future Appointments  Date Time Provider Department Center  07/13/2019  4:15 PM 07/15/2019 [facility] WOC  08/06/2019  9:15 AM [facility] NURSE [facility] MFC-US  08/06/2019  9:15 AM [facility] 10/06/2019 4 [facility]-M[facility]US MFC-US    [place], DO [facility] for [facility], [facility]

## 2019-07-28 ENCOUNTER — Ambulatory Visit: Payer: Medicaid Other | Admitting: Physical Therapy

## 2019-08-06 ENCOUNTER — Ambulatory Visit (HOSPITAL_COMMUNITY)
Admission: RE | Admit: 2019-08-06 | Discharge: 2019-08-06 | Disposition: A | Discharge status: Home / Self Care | Payer: BC Managed Care – PPO | Source: Ambulatory Visit | Attending: Obstetrics and Gynecology | Admitting: Obstetrics and Gynecology

## 2019-08-06 ENCOUNTER — Encounter (HOSPITAL_COMMUNITY): Payer: Self-pay

## 2019-08-06 ENCOUNTER — Other Ambulatory Visit (HOSPITAL_COMMUNITY): Payer: Self-pay | Admitting: *Deleted

## 2019-08-06 ENCOUNTER — Ambulatory Visit: Payer: Medicaid Other | Attending: Family Medicine | Admitting: Physical Therapy

## 2019-08-06 ENCOUNTER — Ambulatory Visit (HOSPITAL_COMMUNITY): Payer: BC Managed Care – PPO | Admitting: *Deleted

## 2019-08-06 ENCOUNTER — Other Ambulatory Visit: Payer: Self-pay

## 2019-08-06 DIAGNOSIS — O09299 Supervision of pregnancy with other poor reproductive or obstetric history, unspecified trimester: Secondary | ICD-10-CM | POA: Insufficient documentation

## 2019-08-06 DIAGNOSIS — Z3A19 19 weeks gestation of pregnancy: Secondary | ICD-10-CM

## 2019-08-06 DIAGNOSIS — D691 Qualitative platelet defects: Secondary | ICD-10-CM

## 2019-08-06 DIAGNOSIS — O0990 Supervision of high risk pregnancy, unspecified, unspecified trimester: Secondary | ICD-10-CM | POA: Insufficient documentation

## 2019-08-06 DIAGNOSIS — O09899 Supervision of other high risk pregnancies, unspecified trimester: Secondary | ICD-10-CM | POA: Insufficient documentation

## 2019-08-06 DIAGNOSIS — O99210 Obesity complicating pregnancy, unspecified trimester: Secondary | ICD-10-CM | POA: Insufficient documentation

## 2019-08-06 DIAGNOSIS — O359XX0 Maternal care for (suspected) fetal abnormality and damage, unspecified, not applicable or unspecified: Secondary | ICD-10-CM

## 2019-08-10 ENCOUNTER — Encounter: Payer: Medicaid Other | Admitting: Family Medicine

## 2019-09-07 ENCOUNTER — Encounter: Payer: Self-pay | Admitting: *Deleted

## 2019-09-10 ENCOUNTER — Ambulatory Visit (HOSPITAL_COMMUNITY): Payer: Medicaid Other

## 2019-09-10 ENCOUNTER — Encounter (HOSPITAL_COMMUNITY): Payer: Self-pay

## 2020-02-18 ENCOUNTER — Other Ambulatory Visit: Payer: BC Managed Care – PPO

## 2020-02-18 DIAGNOSIS — Z20822 Contact with and (suspected) exposure to covid-19: Secondary | ICD-10-CM

## 2020-02-20 LAB — NOVEL CORONAVIRUS, NAA: SARS-CoV-2, NAA: NOT DETECTED

## 2020-02-20 LAB — SARS-COV-2, NAA 2 DAY TAT

## 2020-03-04 ENCOUNTER — Other Ambulatory Visit: Payer: BC Managed Care – PPO

## 2020-05-01 ENCOUNTER — Emergency Department (HOSPITAL_COMMUNITY): Payer: BC Managed Care – PPO

## 2020-05-01 ENCOUNTER — Emergency Department (HOSPITAL_COMMUNITY)
Admission: EM | Admit: 2020-05-01 | Discharge: 2020-05-01 | Disposition: A | Discharge status: Home / Self Care | Payer: BC Managed Care – PPO | Attending: Emergency Medicine | Admitting: Emergency Medicine

## 2020-05-01 DIAGNOSIS — Z3A13 13 weeks gestation of pregnancy: Secondary | ICD-10-CM | POA: Insufficient documentation

## 2020-05-01 DIAGNOSIS — Z7982 Long term (current) use of aspirin: Secondary | ICD-10-CM | POA: Insufficient documentation

## 2020-05-01 DIAGNOSIS — O99419 Diseases of the circulatory system complicating pregnancy, unspecified trimester: Secondary | ICD-10-CM | POA: Insufficient documentation

## 2020-05-01 DIAGNOSIS — R000 Tachycardia, unspecified: Secondary | ICD-10-CM | POA: Insufficient documentation

## 2020-05-01 DIAGNOSIS — R4182 Altered mental status, unspecified: Secondary | ICD-10-CM | POA: Insufficient documentation

## 2020-05-01 DIAGNOSIS — O99340 Other mental disorders complicating pregnancy, unspecified trimester: Secondary | ICD-10-CM | POA: Insufficient documentation

## 2020-05-01 DIAGNOSIS — Z9889 Other specified postprocedural states: Secondary | ICD-10-CM

## 2020-05-01 DIAGNOSIS — O139 Gestational [pregnancy-induced] hypertension without significant proteinuria, unspecified trimester: Secondary | ICD-10-CM | POA: Insufficient documentation

## 2020-05-01 DIAGNOSIS — O039 Complete or unspecified spontaneous abortion without complication: Secondary | ICD-10-CM

## 2020-05-01 DIAGNOSIS — N939 Abnormal uterine and vaginal bleeding, unspecified: Secondary | ICD-10-CM

## 2020-05-01 DIAGNOSIS — IMO0002 Reserved for concepts with insufficient information to code with codable children: Secondary | ICD-10-CM

## 2020-05-01 LAB — CBC WITH DIFFERENTIAL/PLATELET
Abs Immature Granulocytes: 0.03 10*3/uL (ref 0.00–0.07)
Basophils Absolute: 0 10*3/uL (ref 0.0–0.1)
Basophils Relative: 0 %
Eosinophils Absolute: 0 10*3/uL (ref 0.0–0.5)
Eosinophils Relative: 0 %
HCT: 30.7 % — ABNORMAL LOW (ref 36.0–46.0)
Hemoglobin: 9.5 g/dL — ABNORMAL LOW (ref 12.0–15.0)
Immature Granulocytes: 0 %
Lymphocytes Relative: 23 %
Lymphs Abs: 2 10*3/uL (ref 0.7–4.0)
MCH: 27.5 pg (ref 26.0–34.0)
MCHC: 30.9 g/dL (ref 30.0–36.0)
MCV: 89 fL (ref 80.0–100.0)
Monocytes Absolute: 0.5 10*3/uL (ref 0.1–1.0)
Monocytes Relative: 6 %
Neutro Abs: 6 10*3/uL (ref 1.7–7.7)
Neutrophils Relative %: 71 %
Platelets: 194 10*3/uL (ref 150–400)
RBC: 3.45 MIL/uL — ABNORMAL LOW (ref 3.87–5.11)
RDW: 14.1 % (ref 11.5–15.5)
WBC: 8.5 10*3/uL (ref 4.0–10.5)
nRBC: 0 % (ref 0.0–0.2)

## 2020-05-01 LAB — COMPREHENSIVE METABOLIC PANEL
ALT: 13 U/L (ref 0–44)
AST: 19 U/L (ref 15–41)
Albumin: 2.6 g/dL — ABNORMAL LOW (ref 3.5–5.0)
Alkaline Phosphatase: 25 U/L — ABNORMAL LOW (ref 38–126)
Anion gap: 9 (ref 5–15)
BUN: 10 mg/dL (ref 6–20)
CO2: 19 mmol/L — ABNORMAL LOW (ref 22–32)
Calcium: 7.6 mg/dL — ABNORMAL LOW (ref 8.9–10.3)
Chloride: 108 mmol/L (ref 98–111)
Creatinine, Ser: 0.61 mg/dL (ref 0.44–1.00)
GFR, Estimated: >60 mL/min (ref >60)
Glucose, Bld: 101 mg/dL — ABNORMAL HIGH (ref 70–99)
Potassium: 3.9 mmol/L (ref 3.5–5.1)
Sodium: 136 mmol/L (ref 135–145)
Total Bilirubin: 0.5 mg/dL (ref 0.3–1.2)
Total Protein: 5.4 g/dL — ABNORMAL LOW (ref 6.5–8.1)

## 2020-05-01 LAB — HCG, QUANTITATIVE, PREGNANCY: hCG, Beta Chain, Quant, S: 106022 m[IU]/mL — ABNORMAL HIGH

## 2020-05-01 MED ORDER — SODIUM CHLORIDE 0.9 % IV BOLUS
1000.0000 mL | Freq: Once | INTRAVENOUS | Status: AC
  Administered 2020-05-01: 1000 mL via INTRAVENOUS

## 2020-05-01 NOTE — ED Notes (Signed)
Pt transported to US

## 2020-05-01 NOTE — ED Provider Notes (Signed)
[facility] EMERGENCY DEPARTMENT Provider Note  CSN: [id number] Arrival date & time: 05/01/20 0505  Chief Complaint(s) Altered Mental Status and Vaginal Bleeding  HPI Samantha Stanton is a 24 y.o. female [phone] here with vaginal bleeding that began just prior to arrival.  Also sent for altered mental status by family.  Patient is alert having difficulty speaking initially during my assessment.  Able to answer questions appropriately with yes or no answers as well as numbers with her hands.  She follows commands appropriately.  During my assessment patient's mood sporadically return.  She was able to communicate and tell me that she has been having abdominal cramping.  She reports that she is having an abortion stating that she chose to have a chemical abortion on Tuesday.  Reports that she was bleeding from Tuesday to Friday.  Stop bleeding until just prior to arrival.  She reports her last sexual encounter was 3 weeks ago.  No vaginal discharge.  No headache, nausea, vomiting.  No focal deficits.  No visual disturbance.  No other physical complaints   HPI    Past Medical History Past Medical History:  Diagnosis Date  . Anxiety   . Bleeding disorder ([place])    Cannot take ASA  . Chlamydia infection affecting pregnancy 2013  . Hypertension    Gestational HTN  . Pregnancy induced hypertension    with last pregnancy   Patient Active Problem List   Diagnosis Date Noted  . Supervision of high risk pregnancy, antepartum 06/10/2019  . History of pre-eclampsia in prior pregnancy, currently pregnant 06/10/2019  . Short interval between pregnancies affecting pregnancy, antepartum 06/10/2019  . Obesity in pregnancy 06/10/2019  . Intrauterine pregnancy 05/25/2019  . ADHD (attention deficit hyperactivity disorder) 11/11/2018  . Menorrhagia 11/11/2018  . Abnormal platelet aggregation ([place]) 12/24/2012   Home Medication(s) Prior to Admission medications   Medication Sig Start  Date End Date Taking? Authorizing Provider  aspirin EC 81 MG tablet Take 81 mg by mouth daily.    [provider]  Blood Pressure Monitoring (BLOOD PRESSURE KIT) DEVI 1 Device by Does not apply route as needed. Patient not taking: Reported on 07/13/2019 06/10/19   Truett Mainland, DO  docusate sodium (COLACE) 250 MG capsule Take 1 capsule (250 mg total) by mouth daily. Patient not taking: Reported on 07/13/2019 01/13/19   Wende Mott, CNM  ferrous sulfate 324 (65 Fe) MG TBEC Take 1 tablet (325 mg total) by mouth 2 (two) times daily. 01/13/19   Wende Mott, CNM  Prenatal Vit-Fe Fumarate-FA (PRENATAL MULTIVITAMIN) TABS tablet Take 1 tablet by mouth daily at 12 noon.    [provider]                                                                                                                                    Past Surgical History Past Surgical History:  Procedure Laterality Date  .  DILATION AND CURETTAGE OF UTERUS    . WISDOM TOOTH EXTRACTION     Family History Family History  Problem Relation Age of Onset  . Diabetes Mother   . Hypertension Mother   . Cancer Sister        Leukemia    Social History Social History   Tobacco Use  . Smoking status: Never Smoker  . Smokeless tobacco: Never Used  Vaping Use  . Vaping Use: Never used  Substance Use Topics  . Alcohol use: Not Currently  . Drug use: No   Allergies Aspirin  Review of Systems Review of Systems All other systems are reviewed and are negative for acute change except as noted in the HPI  Physical Exam Vital Signs  I have reviewed the triage vital signs BP 134/80 (BP Location: Left Arm)   Pulse (!) 103   Temp 98.6 F (37 C) (Oral)   Resp 20   SpO2 100%   Physical Exam Vitals reviewed. Exam conducted with a chaperone present.  Constitutional:      General: She is not in acute distress.    Appearance: She is well-developed. She is not diaphoretic.  HENT:     Head: Normocephalic  and atraumatic.     Right Ear: External ear normal.     Left Ear: External ear normal.     Nose: Nose normal.  Eyes:     General: No scleral icterus.    Conjunctiva/sclera: Conjunctivae normal.  Neck:     Trachea: Phonation normal.  Cardiovascular:     Rate and Rhythm: Regular rhythm. Tachycardia present.  Pulmonary:     Effort: Pulmonary effort is normal. No respiratory distress.     Breath sounds: No stridor.  Abdominal:     General: There is no distension.     Tenderness: There is no abdominal tenderness.  Genitourinary:    Comments: Large clots and what appears to be products of conception noted on the vulva.  During speculum, large clots and products of conception noted in the vaginal vault.  After clearing the blood and products, cervical oz was noted to be closed.  Not erythematous and nonfriable.  No discharge.  Musculoskeletal:        General: Normal range of motion.     Cervical back: Normal range of motion.  Neurological:     Mental Status: She is alert and oriented to person, place, and time.     Comments: No facial weakness  moves all extremities symmetrically with 5 out of 5 strength. Sensation intact     Psychiatric:        Behavior: Behavior normal.     ED Results and Treatments Labs (all labs ordered are listed, but only abnormal results are displayed) Labs Reviewed  CBC WITH DIFFERENTIAL/PLATELET  COMPREHENSIVE METABOLIC PANEL  HCG, QUANTITATIVE, PREGNANCY  PROTIME-INR  EKG  EKG Interpretation  Date/Time:    Ventricular Rate:    PR Interval:    QRS Duration:   QT Interval:    QTC Calculation:   R Axis:     Text Interpretation:        Radiology No results found.  Pertinent labs & imaging results that were available during my care of the patient were reviewed by me and considered in my medical decision making (see chart for  details).  Medications Ordered in ED Medications - No data to display                                                                                                                                  Procedures Procedures  (including critical care time)  Medical Decision Making / ED Course I have reviewed the nursing notes for this encounter and the patient's prior records (if available in EHR or on provided paperwork).   Samantha Stanton was evaluated in Emergency Department on 05/01/2020 for the symptoms described in the history of present illness. She was evaluated in the context of the global COVID-19 pandemic, which necessitated consideration that the patient might be at risk for infection with the SARS-CoV-2 virus that causes COVID-19. Institutional protocols and algorithms that pertain to the evaluation of patients at risk for COVID-19 are in a state of rapid change based on information released by regulatory bodies including the CDC and federal and state organizations. These policies and algorithms were followed during the patient's care in the ED.    Clinical Course as of May 01 709  Nancy Fetter May 01, 2020  1308 It appears that patient is in the process of avoiding the fetus.  Cervical os is closed.  Patient has a platelet aggregate bleeding disorder. We will obtain screening labs. On record review patient was Rh+, no need for RhoGam at this time. We will also obtain pelvic ultrasound to assess for any retained products of conception.    [PC]  [phone] Patient's muteness she is mute has resolved she is talking on the phone.  Patient reports that she has very bad anxiety.  Likely the cause of her altered mental status/muteness.   [PC]  [id number] Labs currently and ultrasound still pending.  Patient care turned over to Dr Maryan Rued. Patient case and results discussed in detail; please see their note for further ED managment.        [PC]    Clinical Course User Index [PC] Cardama, Grayce Sessions, MD     Final Clinical Impression(s) / ED Diagnoses Final diagnoses:  Abortion  Vaginal bleeding      This chart was dictated using voice recognition software.  Despite best efforts to proofread,  errors can occur which can change the documentation meaning.   Fatima Blank, MD 05/01/20 [phone]

## 2020-05-01 NOTE — Discharge Instructions (Signed)
If you have any passing out or the bleeding becomes so heavy you are feeling lightheaded anytime you stand you need to return to the hospital.  It is important that you call your doctor tomorrow so they can continue to monitor you.  If you start having fever or intense pain you also need to call your doctor.

## 2020-05-01 NOTE — ED Notes (Signed)
Patient verbalizes understanding of discharge instructions. Opportunity for questioning and answers were provided. Armband removed by staff, pt discharged from ED and ambulated to lobby to return home.   

## 2020-05-01 NOTE — ED Triage Notes (Signed)
BIB [facility] after pt family called to report that pt was altered and loosing copious amounts of vaginal blood with large clots. Per EMS pt had abortion 04/29/2020. Upon arrival pt  Alert to name.  B/P 134/80 100 RA 22RR HR 111

## 2020-05-01 NOTE — ED Provider Notes (Signed)
Assumed care of patient at 7:30 AM from Dr. Eudelia Bunch.  Patient with recurrent heavy vaginal bleeding in the setting of recent elective abortion with methotrexate.  Patient's hemoglobin has dropped to 9.5, CMP without acute findings, ultrasound shows no evidence of intrauterine or extrauterine gestational sac.  Prominent endometrium consistent with given history and no other focal abnormality seen.  Endings discussed with the patient.  She remains hemodynamically stable.  Encouraged follow-up with her OB/GYN clinic who has been treating her.  Given return precautions.   Gwyneth Sprout, MD 05/01/20 1022

## 2020-12-22 ENCOUNTER — Encounter: Payer: BC Managed Care – PPO | Admitting: Obstetrics and Gynecology

## 2021-03-01 ENCOUNTER — Ambulatory Visit: Payer: Self-pay | Admitting: *Deleted

## 2021-03-01 NOTE — Telephone Encounter (Signed)
Reason for Disposition  [1] Constant abdominal pain AND [2] present > 2 hours  Answer Assessment - Initial Assessment Questions 1. ONSET: "When did this bleeding start?"       Just started- bloody show when wiped 2. DESCRIPTION: "Describe the bleeding that you are having." "How much bleeding is there?"    - SPOTTING: spotting, or pinkish / brownish mucous discharge; does not fill panty liner or pad    - MILD:  less than 1 pad / hour; less than patient's usual menstrual bleeding   - MODERATE: 1-2 pads / hour; 1 menstrual cup every 6 hours; small-medium blood clots (e.g., pea, grape, small coin)   - SEVERE: soaking 2 or more pads/hour for 2 or more hours; 1 menstrual cup every 2 hours; bleeding not contained by pads or continuous red blood from vagina; large blood clots (e.g., golf ball, large coin)      Pinkish blood on tissue 3. ABDOMINAL PAIN SEVERITY: If present, ask: "How bad is it?"  (e.g., Scale 1-10; mild, moderate, or severe)   - MILD (1-3): doesn't interfere with normal activities, abdomen soft and not tender to touch    - MODERATE (4-7): interferes with normal activities or awakens from sleep, abdomen tender to touch    - SEVERE (8-10): excruciating pain, doubled over, unable to do any normal activities     Mild cramping 4. PREGNANCY: "Do you know how many weeks or months pregnant you are?" "When was the first day of your last normal menstrual period?"     LMP- 01/17/21-3 days- shorter than normal 5. HEMODYNAMIC STATUS: "Are you weak or feeling lightheaded?" If Yes, ask: "Can you stand and walk normally?"      no 6. OTHER SYMPTOMS: "What other symptoms are you having with the bleeding?" (e.g., passed tissue, vaginal discharge, fever, menstrual-type cramps)     cramping  Protocols used: Pregnancy - Vaginal Bleeding Less Than [redacted] Weeks EGA-A-AH

## 2021-03-02 ENCOUNTER — Inpatient Hospital Stay (HOSPITAL_COMMUNITY): Payer: Medicaid Other

## 2021-03-02 ENCOUNTER — Encounter (HOSPITAL_COMMUNITY): Payer: Self-pay | Admitting: *Deleted

## 2021-03-02 ENCOUNTER — Inpatient Hospital Stay (HOSPITAL_COMMUNITY)
Admission: AD | Admit: 2021-03-02 | Discharge: 2021-03-02 | Disposition: A | Discharge status: Home / Self Care | Payer: Medicaid Other | Attending: Obstetrics & Gynecology | Admitting: Obstetrics & Gynecology

## 2021-03-02 DIAGNOSIS — Z3A01 Less than 8 weeks gestation of pregnancy: Secondary | ICD-10-CM | POA: Insufficient documentation

## 2021-03-02 DIAGNOSIS — B379 Candidiasis, unspecified: Secondary | ICD-10-CM

## 2021-03-02 DIAGNOSIS — R1030 Lower abdominal pain, unspecified: Secondary | ICD-10-CM | POA: Diagnosis present

## 2021-03-02 DIAGNOSIS — B9689 Other specified bacterial agents as the cause of diseases classified elsewhere: Secondary | ICD-10-CM | POA: Diagnosis not present

## 2021-03-02 DIAGNOSIS — O4690 Antepartum hemorrhage, unspecified, unspecified trimester: Secondary | ICD-10-CM

## 2021-03-02 DIAGNOSIS — O209 Hemorrhage in early pregnancy, unspecified: Secondary | ICD-10-CM

## 2021-03-02 DIAGNOSIS — O23591 Infection of other part of genital tract in pregnancy, first trimester: Secondary | ICD-10-CM | POA: Diagnosis not present

## 2021-03-02 DIAGNOSIS — O26891 Other specified pregnancy related conditions, first trimester: Secondary | ICD-10-CM

## 2021-03-02 DIAGNOSIS — Z209 Contact with and (suspected) exposure to unspecified communicable disease: Secondary | ICD-10-CM | POA: Diagnosis not present

## 2021-03-02 LAB — COMPREHENSIVE METABOLIC PANEL
ALT: 17 U/L (ref 0–44)
AST: 15 U/L (ref 15–41)
Albumin: 3.4 g/dL — ABNORMAL LOW (ref 3.5–5.0)
Alkaline Phosphatase: 42 U/L (ref 38–126)
Anion gap: 7 (ref 5–15)
BUN: 10 mg/dL (ref 6–20)
CO2: 25 mmol/L (ref 22–32)
Calcium: 9.5 mg/dL (ref 8.9–10.3)
Chloride: 105 mmol/L (ref 98–111)
Creatinine, Ser: 0.76 mg/dL (ref 0.44–1.00)
GFR, Estimated: >60 mL/min (ref >60)
Glucose, Bld: 95 mg/dL (ref 70–99)
Potassium: 3.8 mmol/L (ref 3.5–5.1)
Sodium: 137 mmol/L (ref 135–145)
Total Bilirubin: 0.4 mg/dL (ref 0.3–1.2)
Total Protein: 7.2 g/dL (ref 6.5–8.1)

## 2021-03-02 LAB — CBC WITH DIFFERENTIAL/PLATELET
Abs Immature Granulocytes: 0.01 10*3/uL (ref 0.00–0.07)
Basophils Absolute: 0 10*3/uL (ref 0.0–0.1)
Basophils Relative: 1 %
Eosinophils Absolute: 0.1 10*3/uL (ref 0.0–0.5)
Eosinophils Relative: 1 %
HCT: 36.1 % (ref 36.0–46.0)
Hemoglobin: 11.2 g/dL — ABNORMAL LOW (ref 12.0–15.0)
Immature Granulocytes: 0 %
Lymphocytes Relative: 35 %
Lymphs Abs: 2.4 10*3/uL (ref 0.7–4.0)
MCH: 25.8 pg — ABNORMAL LOW (ref 26.0–34.0)
MCHC: 31 g/dL (ref 30.0–36.0)
MCV: 83.2 fL (ref 80.0–100.0)
Monocytes Absolute: 0.5 10*3/uL (ref 0.1–1.0)
Monocytes Relative: 7 %
Neutro Abs: 3.9 10*3/uL (ref 1.7–7.7)
Neutrophils Relative %: 56 %
Platelets: 317 10*3/uL (ref 150–400)
RBC: 4.34 MIL/uL (ref 3.87–5.11)
RDW: 16.7 % — ABNORMAL HIGH (ref 11.5–15.5)
WBC: 6.9 10*3/uL (ref 4.0–10.5)
nRBC: 0 % (ref 0.0–0.2)

## 2021-03-02 LAB — ABO/RH: ABO/RH(D): A POS

## 2021-03-02 LAB — URINALYSIS, ROUTINE W REFLEX MICROSCOPIC
Bilirubin Urine: NEGATIVE
Glucose, UA: NEGATIVE mg/dL
Hgb urine dipstick: NEGATIVE
Ketones, ur: NEGATIVE mg/dL
Nitrite: NEGATIVE
Protein, ur: NEGATIVE mg/dL
Specific Gravity, Urine: 1.018 (ref 1.005–1.030)
pH: 6 (ref 5.0–8.0)

## 2021-03-02 LAB — WET PREP, GENITAL
Sperm: NONE SEEN
Trich, Wet Prep: NONE SEEN
Yeast Wet Prep HPF POC: NONE SEEN

## 2021-03-02 LAB — HIV ANTIBODY (ROUTINE TESTING W REFLEX): HIV Screen 4th Generation wRfx: NONREACTIVE

## 2021-03-02 LAB — HCG, QUANTITATIVE, PREGNANCY: hCG, Beta Chain, Quant, S: 10717 m[IU]/mL — ABNORMAL HIGH (ref <5)

## 2021-03-02 LAB — POCT PREGNANCY, URINE: Preg Test, Ur: POSITIVE — AB

## 2021-03-02 MED ORDER — METRONIDAZOLE 500 MG PO TABS: 500.0000 mg | ORAL_TABLET | Freq: Two times a day (BID) | ORAL | 0 refills | Status: AC

## 2021-03-02 NOTE — Progress Notes (Signed)
Written and verbal d/c instructions given and understanding voiced. 

## 2021-03-02 NOTE — MAU Note (Addendum)
Spotting since yesterday and alittle more today. Mild cramping. LMP 01/17/21. Last intercourse was Monday

## 2021-03-02 NOTE — Discharge Instructions (Signed)
[facility]   [facility]             [address], [place], [place] [address] [phone]  [facility]                                                             [address], [address], [place], [place], [address] [phone]  [facility] [place]                                    [address], [address], [place], [place], [address] [phone]  [facility] [place] [address], [place], [place], [address] [phone]  [facility] [place]                                 [address], [place], [place], [address] [phone]  [facility]                                    [address], [place], [place], [address] [phone]  [facility], [address], [place], [place], [address]                              [facility] [address], [place], [place], [address] [phone]  [facility]         Phone: [phone]  [facility]      Phone: [phone]   [facility]      Phone: [phone]  [facility]         Phone: [phone]   [facility]    Phone: [phone]  [facility]      Phone: [phone]  [facility]      Phone: [phone]  [facility]        Phone: [phone]  [facility]      Phone: [phone]  

## 2021-03-02 NOTE — MAU Provider Note (Signed)
History     CSN: [id number]  Arrival date and time: 03/02/21 1816   Event Date/Time   First Provider Initiated Contact with Patient 03/02/21 2023      Chief Complaint  Patient presents with   Abdominal Pain   Vaginal Bleeding   HPI  [name] is a 25 y.o. female [phone] @ [redacted]w[redacted]d  here in MAU with complaints of lower abdominal pain and vaginal bleeding. She reports pink spotting that started yesterday. Very light in color; however a little more in quantity today. She reports some mild lower abdominal pain that is located all across her lower abdomen.  The last time she had intercourse was 3 days ago. She has not tried anything over the counter for the pain.   OB History     Gravida  6   Para  3   Term  3   Preterm      AB  1   Living  3      SAB  1   IAB      Ectopic      Multiple  0   Live Births  3           Past Medical History:  Diagnosis Date   Anxiety    Bleeding disorder ([place])    Cannot take ASA   Chlamydia infection affecting pregnancy 2013   Hypertension    Gestational HTN   Pregnancy induced hypertension    with last pregnancy    Past Surgical History:  Procedure Laterality Date   DILATION AND CURETTAGE OF UTERUS     WISDOM TOOTH EXTRACTION      Family History  Problem Relation Age of Onset   Diabetes Mother    Hypertension Mother    Cancer Sister        Leukemia    Social History   Tobacco Use   Smoking status: Never   Smokeless tobacco: Never  Vaping Use   Vaping Use: Never used  Substance Use Topics   Alcohol use: Not Currently   Drug use: No    Allergies:  Allergies  Allergen Reactions   Aspirin Other (See Comments)    Causes nose bleeds and frequent periods - age 78. Saw hematologist and told not to take ASA    Medications Prior to Admission  Medication Sig Dispense Refill Last Dose   ferrous sulfate 324 (65 Fe) MG TBEC Take 1 tablet (325 mg total) by mouth 2 (two) times daily. 30 tablet 1 03/02/2021    Prenatal Vit-Fe Fumarate-FA (PRENATAL MULTIVITAMIN) TABS tablet Take 1 tablet by mouth daily at 12 noon.   03/02/2021   aspirin EC 81 MG tablet Take 81 mg by mouth daily.      Blood Pressure Monitoring (BLOOD PRESSURE KIT) DEVI 1 Device by Does not apply route as needed. (Patient not taking: Reported on 07/13/2019) 1 each 0    docusate sodium (COLACE) 250 MG capsule Take 1 capsule (250 mg total) by mouth daily. (Patient not taking: Reported on 07/13/2019) 30 capsule 1    Results for orders placed or performed during the hospital encounter of 03/02/21 (from the past 48 hour(s))  Pregnancy, urine POC     Status: Abnormal   Collection Time: 03/02/21  6:36 PM  Result Value Ref Range   Preg Test, Ur POSITIVE (A) NEGATIVE    Comment:        THE SENSITIVITY OF THIS METHODOLOGY IS >24 mIU/mL   Urinalysis, Routine w reflex microscopic Urine,  Clean Catch     Status: Abnormal   Collection Time: 03/02/21  6:46 PM  Result Value Ref Range   Color, Urine YELLOW YELLOW   APPearance HAZY (A) CLEAR   Specific Gravity, Urine 1.018 1.005 - 1.030   pH 6.0 5.0 - 8.0   Glucose, UA NEGATIVE NEGATIVE mg/dL   Hgb urine dipstick NEGATIVE NEGATIVE   Bilirubin Urine NEGATIVE NEGATIVE   Ketones, ur NEGATIVE NEGATIVE mg/dL   Protein, ur NEGATIVE NEGATIVE mg/dL   Nitrite NEGATIVE NEGATIVE   Leukocytes,Ua TRACE (A) NEGATIVE   RBC / HPF 0-5 0 - 5 RBC/hpf   WBC, UA 0-5 0 - 5 WBC/hpf   Bacteria, UA RARE (A) NONE SEEN   Squamous Epithelial / LPF 0-5 0 - 5   Mucus PRESENT    Amorphous Crystal PRESENT     Comment: Performed at [facility], [address] [place], [place] [address]  CBC with Differential/Platelet     Status: Abnormal   Collection Time: 03/02/21  8:17 PM  Result Value Ref Range   WBC 6.9 4.0 - 10.5 K/uL   RBC 4.34 3.87 - 5.11 MIL/uL   Hemoglobin 11.2 (L) 12.0 - 15.0 g/dL   HCT 36.1 36.0 - 46.0 %   MCV 83.2 80.0 - 100.0 fL   MCH 25.8 (L) 26.0 - 34.0 pg   MCHC 31.0 30.0 - 36.0 g/dL   RDW  16.7 (H) 11.5 - 15.5 %   Platelets 317 150 - 400 K/uL   nRBC 0.0 0.0 - 0.2 %   Neutrophils Relative % 56 %   Neutro Abs 3.9 1.7 - 7.7 K/uL   Lymphocytes Relative 35 %   Lymphs Abs 2.4 0.7 - 4.0 K/uL   Monocytes Relative 7 %   Monocytes Absolute 0.5 0.1 - 1.0 K/uL   Eosinophils Relative 1 %   Eosinophils Absolute 0.1 0.0 - 0.5 K/uL   Basophils Relative 1 %   Basophils Absolute 0.0 0.0 - 0.1 K/uL   Immature Granulocytes 0 %   Abs Immature Granulocytes 0.01 0.00 - 0.07 K/uL    Comment: Performed at [facility], [address] [place], [place] [address]  Comprehensive metabolic panel     Status: Abnormal   Collection Time: 03/02/21  8:17 PM  Result Value Ref Range   Sodium 137 135 - 145 mmol/L   Potassium 3.8 3.5 - 5.1 mmol/L   Chloride 105 98 - 111 mmol/L   CO2 25 22 - 32 mmol/L   Glucose, Bld 95 70 - 99 mg/dL    Comment: Glucose reference range applies only to samples taken after fasting for at least 8 hours.   BUN 10 6 - 20 mg/dL   Creatinine, Ser 0.76 0.44 - 1.00 mg/dL   Calcium 9.5 8.9 - 10.3 mg/dL   Total Protein 7.2 6.5 - 8.1 g/dL   Albumin 3.4 (L) 3.5 - 5.0 g/dL   AST 15 15 - 41 U/L   ALT 17 0 - 44 U/L   Alkaline Phosphatase 42 38 - 126 U/L   Total Bilirubin 0.4 0.3 - 1.2 mg/dL   GFR, Estimated >60 >60 mL/min    Comment: (NOTE) Calculated using the CKD-EPI Creatinine Equation (2021)    Anion gap 7 5 - 15    Comment: Performed at [facility] [address] [place], [place] [address]  ABO/Rh     Status: None   Collection Time: 03/02/21  8:17 PM  Result Value Ref  Range   ABO/RH(D) A POS    No rh immune globuloin      NOT A RH IMMUNE GLOBULIN CANDIDATE, PT RH POSITIVE Performed at [facility] [address], [place], [place] [address]   hCG, quantitative, pregnancy     Status: Abnormal   Collection Time: 03/02/21  8:17 PM  Result Value Ref Range   hCG, Beta Chain, Quant, S 10,717 (H) <5 mIU/mL    Comment:          GEST. AGE      CONC.   (mIU/mL)   <=1 WEEK        5 - 50     2 WEEKS       50 - 500     3 WEEKS       100 - 10,000     4 WEEKS     1,000 - 30,000     5 WEEKS     3,500 - 115,000   6-8 WEEKS     12,000 - 270,000    12 WEEKS     15,000 - 220,000        FEMALE AND NON-PREGNANT FEMALE:     LESS THAN 5 mIU/mL Performed at [facility], [place] [address], [place], [place] [address]   HIV Antibody (routine testing w rflx)     Status: None   Collection Time: 03/02/21  8:17 PM  Result Value Ref Range   HIV Screen 4th Generation wRfx Non Reactive Non Reactive    Comment: Performed at [facility], [place] [address] [place], [place] [address]  Wet prep, genital     Status: Abnormal   Collection Time: 03/02/21  9:29 PM  Result Value Ref Range   Yeast Wet Prep HPF POC NONE SEEN NONE SEEN   Trich, Wet Prep NONE SEEN NONE SEEN   Clue Cells Wet Prep HPF POC PRESENT (A) NONE SEEN   WBC, Wet Prep HPF POC MANY (A) NONE SEEN   Sperm NONE SEEN     Comment: Performed at [facility], [place] [address] [place],  [address]    US OB LESS THAN 14 WEEKS WITH OB TRANSVAGINAL  Result Date: 03/02/2021 CLINICAL DATA:  Vaginal bleeding for 1 day, positive pregnancy test EXAM: OBSTETRIC <14 WK [place] AND TRANSVAGINAL OB US TECHNIQUE: Both transabdominal and transvaginal ultrasound examinations were performed for complete evaluation of the gestation as well as the maternal uterus, adnexal regions, and pelvic cul-de-sac. Transvaginal technique was performed to assess early pregnancy. COMPARISON:  None. FINDINGS: Intrauterine gestational sac: Present Yolk sac:  Present Embryo:  Absent MSD: 9.7 mm   5 w   5 d Subchorionic hemorrhage:  None visualized. Maternal uterus/adnexae: Ovaries are within normal limits. Minimal free pelvic fluid is noted likely physiologic in nature. IMPRESSION: Single intrauterine gestational sac at 5 weeks 5 days. No fetal pole is noted at this time. Correlation with serial beta HCG levels is recommended.  Repeat imaging can be performed as clinically necessary. Electronically Signed   By: Inez Catalina M.D.   On: 03/02/2021 21:10     Review of Systems Physical Exam   Blood pressure 127/80, pulse 77, temperature 98.3 F (36.8 C), resp. rate 16, height $RemoveBe'5\' 4"'frPybDzGq$  (1.626 m), weight 106.6 kg, last menstrual period 01/17/2021, SpO2 100 %, unknown if currently breastfeeding.  Physical Exam Vitals and nursing note reviewed.  Constitutional:      General: She is not in acute distress.  Appearance: She is well-developed. She is not ill-appearing, toxic-appearing or diaphoretic.  Abdominal:     Tenderness: There is generalized abdominal tenderness. There is no guarding or rebound.  Genitourinary:    Comments: Wet prep and GC collected without speculum.  Scan pink/yellow discharge noted on swabs Cervix is closed, long.  Exam by Noni Saupe, NP  Neurological:     Mental Status: She is alert and oriented to person, place, and time.  Psychiatric:        Behavior: Behavior normal.    MAU Course  Procedures  MDM  A positive blood type.  Wet prep & GC HIV, CBC, Hcg, ABO US OB transvaginal    Assessment and Plan   A:  [redacted] weeks gestation of pregnancy - Plan: Discharge patient  Vaginal bleeding during pregnancy - Plan: US OB LESS THAN 14 WEEKS WITH OB TRANSVAGINAL, US OB LESS THAN 14 WEEKS WITH OB TRANSVAGINAL, Discharge patient  Bacterial vaginosis - Plan: Discharge patient    P:  Discharge home in stable condition Return to MAU if symptoms worsen Bleeding precautions Pelvic rest Rx: Flagyl   Noni Saupe I, NP 03/03/2021 10:07 AM

## 2021-03-03 LAB — GC/CHLAMYDIA PROBE AMP (~~LOC~~) NOT AT ARMC
Chlamydia: NEGATIVE
Comment: NEGATIVE
Comment: NORMAL
Neisseria Gonorrhea: NEGATIVE

## 2021-03-04 LAB — CULTURE, OB URINE

## 2021-03-21 ENCOUNTER — Telehealth (INDEPENDENT_AMBULATORY_CARE_PROVIDER_SITE_OTHER): Payer: Medicaid Other

## 2021-03-21 DIAGNOSIS — O0990 Supervision of high risk pregnancy, unspecified, unspecified trimester: Secondary | ICD-10-CM | POA: Insufficient documentation

## 2021-03-21 DIAGNOSIS — O0993 Supervision of high risk pregnancy, unspecified, third trimester: Secondary | ICD-10-CM | POA: Insufficient documentation

## 2021-03-21 DIAGNOSIS — O169 Unspecified maternal hypertension, unspecified trimester: Secondary | ICD-10-CM

## 2021-03-21 DIAGNOSIS — Z3A00 Weeks of gestation of pregnancy not specified: Secondary | ICD-10-CM

## 2021-03-21 DIAGNOSIS — Z136 Encounter for screening for cardiovascular disorders: Secondary | ICD-10-CM

## 2021-03-21 MED ORDER — BLOOD PRESSURE MONITORING DEVI: 1.0000 | 0 refills | Status: DC

## 2021-03-21 NOTE — Patient Instructions (Signed)
  At our [facility], we work as an integrated team, providing care to address both physical and emotional health. Your medical provider may refer you to see our Behavioral Health Clinician (BHC) on the same day you see your medical provider, as availability permits; often scheduled virtually at your convenience.  Our BHC is available to all patients, visits generally last between 20-30 minutes, but can be longer or shorter, depending on patient need. The BHC offers help with stress management, coping with symptoms of depression and anxiety, major life changes , sleep issues, changing risky behavior, grief and loss, life stress, working on personal life goals, and  behavioral health issues, as these all affect your overall health and wellness.  The BHC is NOT available for the following: FMLA paperwork, court-ordered evaluations, specialty assessments (custody or disability), letters to employers, or obtaining certification for an emotional support animal. The BHC does not provide long-term therapy. You have the right to refuse integrated behavioral health services, or to reschedule to see the BHC at a later date.  Confidentiality exception: If it is suspected that a child or disabled adult is being abused or neglected, we are required by law to report that to either Child Protective Services or Adult Protective Services.  If you have a diagnosis of Bipolar affective disorder, Schizophrenia, or recurrent Major depressive disorder, we will recommend that you establish care with a psychiatrist, as these are lifelong, chronic conditions, and we want your overall emotional health and medications to be more closely monitored. If you anticipate needing extended maternity leave due to mental health issues postpartum, it it recommended you inform your medical provider, so we can put in a referral to a psychiatrist as soon as possible. The BHC is unable to recommend an extended maternity leave for mental  health issues. Your medical provider or BHC may refer you to a therapist for ongoing, traditional therapy, or to a psychiatrist, for medication management, if it would benefit your overall health. Depending on your insurance, you may have a copay or be charged a deductible, depending on your insurance, to see the BHC. If you are uninsured, it is recommended that you apply for financial assistance. (Forms may be requested at the front desk for in-person visits, via MyChart, or request a form during a virtual visit).  If you see the BHC more than 6 times, you will have to complete a comprehensive clinical assessment interview with the BHC to resume integrated services.  For virtual visits with the BHC, you must be physically in the state of [place] at the time of the visit. For example, if you live in [place], you will have to do an in-person visit with the BHC, and your out-of-state insurance may not cover behavioral health services in [place]. If you are going out of the state or country for any reason, the BHC may see you virtually when you return to [place], but not while you are physically outside of [place].    

## 2021-03-21 NOTE — Progress Notes (Signed)
Patient was assessed and managed by nursing staff during this encounter. I have reviewed the chart and agree with the documentation and plan.   Valari Taylor, MD 03/21/2021 9:52 AM 

## 2021-03-21 NOTE — Progress Notes (Signed)
New OB Intake  I connected with  Samantha Stanton on 03/21/21 at  8:15 AM EDT by MyChart Video Visit and verified that I am speaking with the correct person using two identifiers. Nurse is located at [facility] and pt is located at home.  I discussed the limitations, risks, security and privacy concerns of performing an evaluation and management service by telephone and the availability of in person appointments. I also discussed with the patient that there may be a patient responsible charge related to this service. The patient expressed understanding and agreed to proceed.  I explained I am completing New OB Intake today. We discussed her EDD of 10/24/21 that is based on LMP of 01/17/21. Pt is G6/P3. I reviewed her allergies, medications, Medical/Surgical/OB history, and appropriate screenings. I informed her of [facility] services. Based on history, this is a/an  pregnancy complicated by hypertension .   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   MyChart/Babyscripts MyChart access verified. I explained pt will have some visits in office and some virtually. Babyscripts instructions given and order placed. Patient verifies receipt of registration text/e-mail. Account successfully created and app downloaded.  Blood Pressure Cuff  Blood pressure cuff ordered for patient to pick-up from [employer]. Explained after first prenatal appt pt will check weekly and document in Babyscripts.  Weight scale:  Patient    have weight scale. Weight scale ordered for patient to pick up form [facility].   Anatomy US Explained first scheduled [place] will be around 19 weeks. Anatomy US scheduled for 06/02/21 at 10:30a. Pt notified to arrive at 10:15a.  Labs Discussed Avelina Laine genetic screening with patient. Would like both Panorama and Horizon drawn at new OB visit. Routine prenatal labs needed.  Covid Vaccine Patient has not covid vaccine.   Mother/ Baby Dyad Candidate?    If yes, offer as possibility  Informed patient of [facility] website  and placed link in her AVS.   Social Determinants of Health Food Insecurity: Patient denies food insecurity. WIC Referral: Patient is interested in referral to [facility].  Transportation: Patient denies transportation needs. Childcare: Discussed no children allowed at ultrasound appointments. Offered childcare services; patient declines childcare services at this time.  Send link to Pregnancy Navigators   Placed OB Box on problem list and updated  First visit review I reviewed new OB appt with pt. I explained she will have a pelvic exam, ob bloodwork with genetic screening, and PAP smear. Explained pt will be seen by Dr. Macon Large at first visit; encounter routed to appropriate provider. Explained that patient will be seen by pregnancy navigator following visit with provider. [facility] information placed in AVS.   Samantha Stanton, CMA 03/21/2021  8:33 AM

## 2021-04-07 ENCOUNTER — Ambulatory Visit (INDEPENDENT_AMBULATORY_CARE_PROVIDER_SITE_OTHER): Payer: Medicaid Other | Admitting: Obstetrics & Gynecology

## 2021-04-07 ENCOUNTER — Other Ambulatory Visit: Payer: Self-pay

## 2021-04-07 ENCOUNTER — Other Ambulatory Visit (HOSPITAL_COMMUNITY)
Admission: RE | Admit: 2021-04-07 | Discharge: 2021-04-07 | Disposition: A | Discharge status: Home / Self Care | Payer: Medicaid Other | Source: Ambulatory Visit | Attending: Obstetrics & Gynecology | Admitting: Obstetrics & Gynecology

## 2021-04-07 ENCOUNTER — Encounter: Payer: Self-pay | Admitting: Obstetrics & Gynecology

## 2021-04-07 VITALS — BP 115/78 | HR 93 | Wt 238.9 lb

## 2021-04-07 DIAGNOSIS — O99891 Bacteriuria: Secondary | ICD-10-CM

## 2021-04-07 DIAGNOSIS — Z3A11 11 weeks gestation of pregnancy: Secondary | ICD-10-CM

## 2021-04-07 DIAGNOSIS — B3731 Acute candidiasis of vulva and vagina: Secondary | ICD-10-CM

## 2021-04-07 DIAGNOSIS — O209 Hemorrhage in early pregnancy, unspecified: Secondary | ICD-10-CM | POA: Diagnosis present

## 2021-04-07 DIAGNOSIS — R8271 Bacteriuria: Secondary | ICD-10-CM

## 2021-04-07 DIAGNOSIS — O0990 Supervision of high risk pregnancy, unspecified, unspecified trimester: Secondary | ICD-10-CM | POA: Diagnosis present

## 2021-04-07 DIAGNOSIS — R4589 Other symptoms and signs involving emotional state: Secondary | ICD-10-CM

## 2021-04-07 DIAGNOSIS — O09299 Supervision of pregnancy with other poor reproductive or obstetric history, unspecified trimester: Secondary | ICD-10-CM

## 2021-04-07 DIAGNOSIS — O10919 Unspecified pre-existing hypertension complicating pregnancy, unspecified trimester: Secondary | ICD-10-CM

## 2021-04-07 DIAGNOSIS — D691 Qualitative platelet defects: Secondary | ICD-10-CM

## 2021-04-07 DIAGNOSIS — O99210 Obesity complicating pregnancy, unspecified trimester: Secondary | ICD-10-CM

## 2021-04-07 LAB — POCT URINALYSIS DIP (DEVICE)
Bilirubin Urine: NEGATIVE
Glucose, UA: NEGATIVE mg/dL
Hgb urine dipstick: NEGATIVE
Ketones, ur: NEGATIVE mg/dL
Nitrite: NEGATIVE
Protein, ur: NEGATIVE mg/dL
Specific Gravity, Urine: 1.02 (ref 1.005–1.030)
Urobilinogen, UA: 0.2 mg/dL (ref 0.0–1.0)
pH: 7 (ref 5.0–8.0)

## 2021-04-07 NOTE — Progress Notes (Signed)
History:   Samantha Stanton is a 25 y.o. [id number] at [redacted]w[redacted]d by LMP being seen today for her first obstetrical visit.  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 Pregnancy history fully reviewed.  Patient reports having spotting off and on, had ultrasound earlier this pregnancy which did not show subchorionic hemorrhage. No current spotting or other complaints.      HISTORY: OB History  Gravida Para Term Preterm AB Living  6 3 3  0 2 3  SAB IAB Ectopic Multiple Live Births  1 1 0 0 3    # Outcome Date GA Lbr Len/2nd Weight Sex Delivery Anes PTL Lv  6 Current           5 Term 12/16/19     Vag-Spont   LIV  4 Term 12/14/18 [redacted]w[redacted]d 06:05 / 00:09 6 lb 3.1 oz (2.809 kg) M Vag-Spont EPI  LIV     Birth Comments: HTN, preeclampsia     Name: Hurston,BOY Stephani     Apgar1: 8  Apgar5: 9  3 SAB 04/17/17 [redacted]w[redacted]d    SAB        Birth Comments: had D&C  2 Term 08/25/13 [redacted]w[redacted]d  7 lb 11 oz (3.487 kg) M Vag-Spont EPI N LIV     Complications: Gestational hypertension     Name: [redacted]w[redacted]d  1 IAB           Last pap smear was done 07/2018 and was normal  Past Medical History:  Diagnosis Date   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  Consider MFM consult.   Anxiety    Bleeding disorder (HCC)    Cannot take ASA   Chlamydia infection affecting pregnancy 2013   Hypertension    Gestational HTN   Menorrhagia 11/11/2018   Pregnancy induced hypertension    with last  pregnancy   Past Surgical History:  Procedure Laterality Date   DILATION AND CURETTAGE OF UTERUS     WISDOM TOOTH EXTRACTION     Family History  Problem Relation Age of Onset   Diabetes Mother    Hypertension Mother    Cancer Sister        Leukemia   Social History   Tobacco Use   Smoking status: Never   Smokeless tobacco: Never  Vaping Use   Vaping Use: Never used  Substance Use Topics   Alcohol use: Not Currently   Drug use: No   Allergies  Allergen Reactions   Aspirin Other (See Comments)    Causes nose bleeds and frequent periods - age 89. Saw hematologist and told not to take ASA   Current Outpatient Medications on File Prior to Visit  Medication Sig Dispense Refill   ferrous sulfate 324 (65 Fe) MG TBEC Take 1 tablet (325 mg total) by mouth 2 (two) times daily. 30 tablet 1   Magnesium 250 MG TABS Take 250 mg by mouth.     Prenatal Vit-Fe Fumarate-FA (PRENATAL MULTIVITAMIN) TABS tablet Take 1 tablet by mouth daily at 12 noon.  aspirin EC 81 MG tablet Take 81 mg by mouth daily. (Patient not taking: No sig reported)     Blood Pressure Monitoring DEVI 1 each by Does not apply route once a week. (Patient not taking: Reported on 04/07/2021) 1 each 0   docusate sodium (COLACE) 250 MG capsule Take 1 capsule (250 mg total) by mouth daily. (Patient not taking: No sig reported) 30 capsule 1   No current facility-administered medications on file prior to visit.    Review of Systems Pertinent items noted in HPI and remainder of comprehensive ROS otherwise negative.  Physical Exam:   Vitals:   04/07/21 0934  BP: 115/78  Pulse: 93  Weight: 238 lb 14.4 oz (108.4 kg)   Fetal Heart Rate (bpm): 165   General: well-developed, well-nourished female in no acute distress  Breasts:  deferred  Skin: normal coloration and turgor, no rashes  Neurologic: oriented, normal, negative, normal mood  Extremities: normal strength, tone, and muscle mass, ROM of all joints is normal   HEENT PERRLA, extraocular movement intact and sclera clear, anicteric  Neck supple and no masses  Cardiovascular: regular rate and rhythm  Respiratory:  no respiratory distress, normal breath sounds  Abdomen: soft, non-tender; bowel sounds normal; no masses,  no organomegaly  Pelvic: normal external genitalia, no lesions, normal vaginal mucosa, normal vaginal discharge, normal cervix, pap smear done. Some bleeding noted after pap due to small cervical ectropion, treated with silver nitrate. Exam done in the presence of a chaperone.     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 Chronic hypertension affecting pregnancy 2. History of pre-eclampsia in prior pregnancy, currently pregnant [ ]  Baseline labs (CBC, CMP, urine Pr:Cr) - ordered [ ]  Serial growth scans 20-24-28-32-35-38 [ ]  Antenatal testing starting at 32 weeks [ ]  Delivery by 39 weeks (with meds, 40 with no meds) or earlier if needed Of note, patient cannot take aspirin due to platelet aggregation disorder Discussed implications of CHTN in pregnancy, need for antenatal testing and frequent ultrasounds/prenatal visits, need for optimizing BP control to decrease CHTN/preeclampsia associated maternal-fetal morbidity and mortality. Will check baseline labs today. - Comprehensive metabolic panel - Protein / creatinine ratio, urine  3. Abnormal platelet aggregation (HCC) Followed at [facility], cannot take aspirin  4. Bleeding in early pregnancy - Cervicovaginal ancillary only done  5. Maternal morbid obesity, antepartum  (HCC) Will monitor weight, TWG 11-20 lb recommended  6. Sad mood No SI/HI reported.  Patient verbally consented to [facility] services about presenting concerns and psychiatric consultation as appropriate. - Ambulatory referral to Integrated Behavioral Health  7. [redacted] weeks gestation of pregnancy 8. Supervision of high risk pregnancy, antepartum - Genetic Screening - CBC/D/Plt+RPR+Rh+ABO+RubIgG... - Culture, OB Urine - Hemoglobin A1c - Cytology - PAP Initial labs drawn. Continue prenatal vitamins. Problem list reviewed and updated. Genetic Screening discussed, NIPS: ordered. Ultrasound discussed; fetal anatomic survey: ordered. Anticipatory guidance about prenatal visits given including labs, ultrasounds, and testing. Discussed usage of Babyscripts and virtual visits as additional source of managing and completing prenatal visits in midst of coronavirus and pandemic.   Encouraged to complete  MyChart Registration for her ability to review results, send requests, and have questions addressed.  The nature of  - [facility] with multiple MDs and Advanced Practice Providers was explained to patient; also emphasized that residents, students are part of our team. Routine obstetric precautions reviewed. Encouraged to seek out care at office or emergency room [facility] MAU preferred) for urgent and/or emergent concerns. Return in about 4 weeks (around 05/05/2021) for OFFICE OB VISIT (MD only).     Jaynie Collins, MD, FACOG Obstetrician & Gynecologist, [facility] for [employer], [facility]

## 2021-04-08 LAB — PROTEIN / CREATININE RATIO, URINE
Creatinine, Urine: 133.5 mg/dL
Protein, Ur: 7.7 mg/dL
Protein/Creat Ratio: 58 mg/g creat (ref 0–200)

## 2021-04-10 ENCOUNTER — Encounter: Payer: Self-pay | Admitting: *Deleted

## 2021-04-10 LAB — CBC/D/PLT+RPR+RH+ABO+RUBIGG...
Antibody Screen: NEGATIVE
Basophils Absolute: 0 10*3/uL (ref 0.0–0.2)
Basos: 1 %
EOS (ABSOLUTE): 0.1 10*3/uL (ref 0.0–0.4)
Eos: 1 %
HCV Ab: <0.1 s/co ratio (ref 0.0–0.9)
HIV Screen 4th Generation wRfx: NONREACTIVE
Hematocrit: 36.6 % (ref 34.0–46.6)
Hemoglobin: 12.1 g/dL (ref 11.1–15.9)
Hepatitis B Surface Ag: NEGATIVE
Immature Grans (Abs): 0 10*3/uL (ref 0.0–0.1)
Immature Granulocytes: 0 %
Lymphocytes Absolute: 1.7 10*3/uL (ref 0.7–3.1)
Lymphs: 27 %
MCH: 26.4 pg — ABNORMAL LOW (ref 26.6–33.0)
MCHC: 33.1 g/dL (ref 31.5–35.7)
MCV: 80 fL (ref 79–97)
Monocytes Absolute: 0.4 10*3/uL (ref 0.1–0.9)
Monocytes: 7 %
Neutrophils Absolute: 4.1 10*3/uL (ref 1.4–7.0)
Neutrophils: 64 %
Platelets: 281 10*3/uL (ref 150–450)
RBC: 4.59 x10E6/uL (ref 3.77–5.28)
RDW: 15.6 % — ABNORMAL HIGH (ref 11.7–15.4)
RPR Ser Ql: REACTIVE — AB
Rh Factor: POSITIVE
Rubella Antibodies, IGG: 3.76 index (ref >0.99)
WBC: 6.3 10*3/uL (ref 3.4–10.8)

## 2021-04-10 LAB — CERVICOVAGINAL ANCILLARY ONLY
Bacterial Vaginitis (gardnerella): NEGATIVE
Candida Glabrata: NEGATIVE
Candida Vaginitis: POSITIVE — AB
Chlamydia: NEGATIVE
Comment: NEGATIVE
Comment: NEGATIVE
Comment: NEGATIVE
Comment: NEGATIVE
Comment: NEGATIVE
Comment: NORMAL
Neisseria Gonorrhea: NEGATIVE
Trichomonas: NEGATIVE

## 2021-04-10 LAB — COMPREHENSIVE METABOLIC PANEL
ALT: 29 IU/L (ref 0–32)
AST: 19 IU/L (ref 0–40)
Albumin/Globulin Ratio: 1.3 (ref 1.2–2.2)
Albumin: 4 g/dL (ref 3.9–5.0)
Alkaline Phosphatase: 44 IU/L (ref 44–121)
BUN/Creatinine Ratio: 10 (ref 9–23)
BUN: 6 mg/dL (ref 6–20)
Bilirubin Total: <0.2 mg/dL (ref 0.0–1.2)
CO2: 20 mmol/L (ref 20–29)
Calcium: 9.4 mg/dL (ref 8.7–10.2)
Chloride: 103 mmol/L (ref 96–106)
Creatinine, Ser: 0.6 mg/dL (ref 0.57–1.00)
Globulin, Total: 3 g/dL (ref 1.5–4.5)
Glucose: 78 mg/dL (ref 70–99)
Potassium: 4.2 mmol/L (ref 3.5–5.2)
Sodium: 136 mmol/L (ref 134–144)
Total Protein: 7 g/dL (ref 6.0–8.5)
eGFR: 128 mL/min/{1.73_m2} (ref >59)

## 2021-04-10 LAB — RPR, QUANT+TP ABS (REFLEX)
Rapid Plasma Reagin, Quant: 1:1 {titer} — ABNORMAL HIGH
T Pallidum Abs: NONREACTIVE

## 2021-04-10 LAB — HEMOGLOBIN A1C
Est. average glucose Bld gHb Est-mCnc: 108 mg/dL
Hgb A1c MFr Bld: 5.4 % (ref 4.8–5.6)

## 2021-04-10 LAB — HCV INTERPRETATION

## 2021-04-10 MED ORDER — TERCONAZOLE 0.8 % VA CREA: 1.0000 | TOPICAL_CREAM | Freq: Every day | VAGINAL | 0 refills | Status: DC

## 2021-04-10 NOTE — Addendum Note (Signed)
Addended by: Jaynie Collins A on: 04/10/2021 02:54 PM   Modules accepted: Orders

## 2021-04-10 NOTE — BH Specialist Note (Signed)
Integrated Behavioral Health via Telemedicine Visit  04/10/2021 Samantha Stanton [id number]  Number of Integrated Behavioral Health visits: 1 Session Start time: 8:22  Session End time: 9:04 Total time:  57  Referring Provider: Jaynie Collins, MD Patient/Family location: Home [facility] Provider location: Center for [facility]  All persons participating in visit: [name] and [facility] Samantha Stanton   Types of Service: Individual psychotherapy and Video visit  I connected with Samantha Stanton and/or Samantha Stanton's  n/a  via  Telephone or Video Enabled Telemedicine Application  (Video is Caregility application) and verified that I am speaking with the correct person using two identifiers. Discussed confidentiality: Yes   I discussed the limitations of telemedicine and the availability of in person appointments.  Discussed there is a possibility of technology failure and discussed alternative modes of communication if that failure occurs.  I discussed that engaging in this telemedicine visit, they consent to the provision of behavioral healthcare and the services will be billed under their insurance.  Patient and/or legal guardian expressed understanding and consented to Telemedicine visit: Yes   Presenting Concerns: Patient and/or family reports the following symptoms/concerns: Feeling overwhelmed,(25yo w ADHD, 25yo with autism, 25yo; working from home), anxiety, depression, panic when leaving the house, lack of quality sleep; pt open to implementing self-coping strategies.  Duration of problem: Current pregnancy; Severity of problem:  moderately severe  Patient and/or Family's Strengths/Protective Factors: Social connections, Concrete supports in place (healthy food, safe environments, etc.), Sense of purpose, and Physical Health (exercise, healthy diet, medication compliance, etc.)  Goals Addressed: Patient will:  Reduce symptoms of: anxiety,  depression, and stress   Increase knowledge and/or ability of: coping skills and healthy habits   Demonstrate ability to: Increase healthy adjustment to current life circumstances  Progress towards Goals: Ongoing  Interventions: Interventions utilized:  Mindfulness or Management consultant, Psychoeducation and/or Health Education, and Link to [employer] Standardized Assessments completed:  PHQ9/GAD7 given in past two weeks  Patient and/or Family Response: Pt agrees with treatment plan  Assessment: Patient currently experiencing Adjustment disorder with mixed anxious and depressed mood.   Patient may benefit from psychoeducation and brief therapeutic interventions regarding coping with symptoms of anxiety, depression, stress.  Plan: Follow up with behavioral health clinician on : Two weeks Behavioral recommendations:  -Continue taking prenatal vitamin daily -CALM relaxation breathing exercise twice daily (morning; at bedtime with sleep sounds); as needed throughout the day -Review medication information on [email or web address]  Referral(s): Integrated [employer] (In Clinic) and [employer]:  mother to baby website  I discussed the assessment and treatment plan with the patient and/or parent/guardian. They were provided an opportunity to ask questions and all were answered. They agreed with the plan and demonstrated an understanding of the instructions.   They were advised to call back or seek an in-person evaluation if the symptoms worsen or if the condition fails to improve as anticipated.  Rae Lips, LCSW  Depression screen [facility] 2/9 04/07/2021 03/21/2021 06/15/2019 06/10/2019 11/10/2018  Decreased Interest 3 0 0 1 1  Down, Depressed, Hopeless 3 0 0 1 0  PHQ - 2 Score 6 0 0 2 1  Altered sleeping 3 0 0 0 0  Tired, decreased energy 3 0 0 3 1  Change in appetite 1 0 0 0 0  Feeling bad or failure about yourself  2 0 0 0 0  Trouble concentrating 3 0 0  0 0  Moving slowly or fidgety/restless  2 0 0 0 0  Suicidal thoughts 0 0 0 0 0  PHQ-9 Score 20 0 0 5 2   GAD 7 : Generalized Anxiety Score 04/07/2021 06/15/2019 06/10/2019 11/10/2018  Nervous, Anxious, on Edge 3 0 1 0  Control/stop worrying 3 0 1 0  Worry too much - different things 3 0 1 1  Trouble relaxing 3 0 1 0  Restless 2 0 0 0  Easily annoyed or irritable 2 0 1 0  Afraid - awful might happen 2 0 0 0  Total GAD 7 Score 18 0 5 1

## 2021-04-11 ENCOUNTER — Telehealth: Payer: Self-pay

## 2021-04-11 LAB — URINE CULTURE, OB REFLEX

## 2021-04-11 LAB — CYTOLOGY - PAP: Diagnosis: NEGATIVE

## 2021-04-11 LAB — CULTURE, OB URINE

## 2021-04-11 MED ORDER — NITROFURANTOIN MONOHYD MACRO 100 MG PO CAPS: 100.0000 mg | ORAL_CAPSULE | Freq: Two times a day (BID) | ORAL | 0 refills | Status: AC

## 2021-04-11 NOTE — Telephone Encounter (Signed)
-----   Message from Tereso Newcomer, MD sent at 04/11/2021 10:57 AM EST ----- Patient with elevated amount of bacteria in her urine (bacteriuria) of Staphylococcus haemolyticus.  Macrobid prescribed.  Please call to inform patient of results and advise her to pick up prescription and take as directed.

## 2021-04-11 NOTE — Addendum Note (Signed)
Addended by: Jaynie Collins A on: 04/11/2021 10:57 AM   Modules accepted: Orders

## 2021-04-11 NOTE — Telephone Encounter (Signed)
Spoke with pt. Pt given results and recommendations per Dr Macon Large. Pt verbalized understanding and agreeable to plan of care.  Samantha Stanton

## 2021-04-17 ENCOUNTER — Ambulatory Visit (INDEPENDENT_AMBULATORY_CARE_PROVIDER_SITE_OTHER): Payer: Medicaid Other | Admitting: Clinical

## 2021-04-17 DIAGNOSIS — F4323 Adjustment disorder with mixed anxiety and depressed mood: Secondary | ICD-10-CM

## 2021-04-17 NOTE — Patient Instructions (Signed)
[facility] [address] [place], [place] [address] [phone] (main office) [phone] (Madisin Hasan's office)  Mother To Baby [email or web address]  /Emotional Wellbeing Apps and Websites Here are a few free apps meant to help you to help yourself.  To find, try searching on the internet to see if the app is offered on Apple/Android devices. If your first choice doesn't come up on your device, the good news is that there are many choices! Play around with different apps to see which ones are helpful to you.    Calm This is an app meant to help increase calm feelings. Includes info, strategies, and tools for tracking your feelings.      Calm Harm  This app is meant to help with self-harm. Provides many 5-minute or 15-min coping strategies for doing instead of hurting yourself.       Healthy Minds Health Minds is a problem-solving tool to help deal with emotions and cope with stress you encounter wherever you are.      MindShift This app can help people cope with anxiety. Rather than trying to avoid anxiety, you can make an important shift and face it.      MY3  MY3 features a support system, safety plan and resources with the goal of offering a tool to use in a time of need.       My Life My Voice  This mood journal offers a simple solution for tracking your thoughts, feelings and moods. Animated emoticons can help identify your mood.       Relax Melodies Designed to help with sleep, on this app you can mix sounds and meditations for relaxation.      Smiling Mind Smiling Mind is meditation made easy: it's a simple tool that helps put a smile on your mind.        Stop, Breathe & Think  A friendly, simple guide for people through meditations for mindfulness and compassion.  Stop, Breathe and Think Kids Enter your current feelings and choose a "mission" to help you cope. Offers videos for certain moods instead of  just sound recordings.       Team Orange The goal of this tool is to help teens change how they think, act, and react. This app helps you focus on your own good feelings and experiences.      The Virtual Hope Box The Virtual Hope Box (VHB) contains simple tools to help patients with coping, relaxation, distraction, and positive thinking.     

## 2021-04-19 NOTE — BH Specialist Note (Signed)
Integrated Behavioral Health via Telemedicine Visit  04/19/2021 Kimble Delaurentis [id number]  Number of Integrated Behavioral Health visits: 2 Session Start time: 9:50  Session End time: 10:14 Total time:  24  Referring Provider: Jaynie Collins, MD Patient/Family location: Home [facility] Provider location: Center for [facility] at [facility] for [facility]  All persons participating in visit: [name] and [facility] [name]   Types of Service: Individual psychotherapy and Video visit  I connected with [name] and/or [name]'s  n/a  via  Telephone or Video Enabled Telemedicine Application  (Video is Caregility application) and verified that I am speaking with the correct person using two identifiers. Discussed confidentiality: Yes   I discussed the limitations of telemedicine and the availability of in person appointments.  Discussed there is a possibility of technology failure and discussed alternative modes of communication if that failure occurs.  I discussed that engaging in this telemedicine visit, they consent to the provision of behavioral healthcare and the services will be billed under their insurance.  Patient and/or legal guardian expressed understanding and consented to Telemedicine visit: Yes   Presenting Concerns: Patient and/or family reports the following symptoms/concerns: Fatigue and lack of quality sleep; using relaxation breathing as needed throughout the day.  Duration of problem: Current pregnany; Severity of problem: moderate  Patient and/or Family's Strengths/Protective Factors: Social connections, Concrete supports in place (healthy food, safe environments, etc.), Sense of purpose, and Physical Health (exercise, healthy diet, medication compliance, etc.)  Goals Addressed: Patient will:  Reduce symptoms of: anxiety, depression, and stress   Demonstrate ability to: Increase healthy adjustment to current life  circumstances  Progress towards Goals: Ongoing  Interventions: Interventions utilized:  Psychoeducation and/or Health Education and Supportive Reflection Standardized Assessments completed: GAD-7 and PHQ 9  Patient and/or Family Response: Pt using self-coping strategy effectively; agrees with continued treatment plan  Assessment: Patient currently experiencing Adjustment disorder with mixed anxious and depressed mood.   Patient may benefit from continued psychoeducation and brief therapeutic interventions regarding coping with symptoms of anxiety, depression, stress .  Plan: Follow up with behavioral health clinician on : One month Behavioral recommendations:  -Continue taking prenatal vitamin as prescribed -Continue using relaxation breathing exercise daily as needed -Continue upcoming holiday plans, as discussed, for much-needed fun and relaxation -Consider viewing [email or web address] site for important information in pregnancy, as discussed Referral(s): Integrated [employer] (In Clinic)  I discussed the assessment and treatment plan with the patient and/or parent/guardian. They were provided an opportunity to ask questions and all were answered. They agreed with the plan and demonstrated an understanding of the instructions.   They were advised to call back or seek an in-person evaluation if the symptoms worsen or if the condition fails to improve as anticipated.  Rae Lips, LCSW  Depression screen [facility] 2/9 05/03/2021 04/07/2021 03/21/2021 06/15/2019 06/10/2019  Decreased Interest 1 3 0 0 1  Down, Depressed, Hopeless 1 3 0 0 1  PHQ - 2 Score 2 6 0 0 2  Altered sleeping 3 3 0 0 0  Tired, decreased energy 3 3 0 0 3  Change in appetite 0 1 0 0 0  Feeling bad or failure about yourself  0 2 0 0 0  Trouble concentrating 3 3 0 0 0  Moving slowly or fidgety/restless 1 2 0 0 0  Suicidal thoughts 0 0 0 0 0  PHQ-9 Score 12 20 0 0 5   GAD 7 : Generalized  Anxiety Score 05/03/2021  04/07/2021 06/15/2019 06/10/2019  Nervous, Anxious, on Edge 1 3 0 1  Control/stop worrying 1 3 0 1  Worry too much - different things 1 3 0 1  Trouble relaxing 0 3 0 1  Restless 0 2 0 0  Easily annoyed or irritable 1 2 0 1  Afraid - awful might happen 0 2 0 0  Total GAD 7 Score 4 18 0 5

## 2021-05-03 ENCOUNTER — Ambulatory Visit (INDEPENDENT_AMBULATORY_CARE_PROVIDER_SITE_OTHER): Payer: Medicaid Other | Admitting: Clinical

## 2021-05-03 DIAGNOSIS — F4323 Adjustment disorder with mixed anxiety and depressed mood: Secondary | ICD-10-CM

## 2021-05-03 NOTE — Patient Instructions (Signed)
[facility] [address] , [place] [address] [phone] (main office) [phone] (Sanjith Siwek's office)  [email or web address]   

## 2021-05-04 ENCOUNTER — Telehealth: Payer: Self-pay

## 2021-05-04 NOTE — Telephone Encounter (Signed)
Called pt who states this was taken this morning shortly after walking for exercise. Reports she is feeling well. Is not at home currently. Will recheck when she returns home and record in Babyscripts or send via MyChart.

## 2021-05-04 NOTE — Telephone Encounter (Signed)
Elevated BP call received from Babyscripts of 155/82, no symptoms reported.

## 2021-05-05 ENCOUNTER — Other Ambulatory Visit: Payer: Self-pay

## 2021-05-05 ENCOUNTER — Encounter: Payer: Self-pay | Admitting: Family Medicine

## 2021-05-05 ENCOUNTER — Ambulatory Visit (INDEPENDENT_AMBULATORY_CARE_PROVIDER_SITE_OTHER): Payer: Medicaid Other | Admitting: Family Medicine

## 2021-05-05 VITALS — BP 125/76 | HR 102 | Wt 241.3 lb

## 2021-05-05 DIAGNOSIS — O10919 Unspecified pre-existing hypertension complicating pregnancy, unspecified trimester: Secondary | ICD-10-CM

## 2021-05-05 DIAGNOSIS — O99210 Obesity complicating pregnancy, unspecified trimester: Secondary | ICD-10-CM

## 2021-05-05 DIAGNOSIS — D691 Qualitative platelet defects: Secondary | ICD-10-CM

## 2021-05-05 DIAGNOSIS — O09299 Supervision of pregnancy with other poor reproductive or obstetric history, unspecified trimester: Secondary | ICD-10-CM

## 2021-05-05 DIAGNOSIS — O0990 Supervision of high risk pregnancy, unspecified, unspecified trimester: Secondary | ICD-10-CM

## 2021-05-05 NOTE — Progress Notes (Signed)
Pt has questions about her labs, states always tired & is concerned.

## 2021-05-05 NOTE — Progress Notes (Signed)
   Subjective:  Samantha Stanton is a 25 y.o. [id number] at [redacted]w[redacted]d being seen today for ongoing prenatal care.  She is currently monitored for the following issues for this high-risk pregnancy and has ADHD (attention deficit hyperactivity disorder); Abnormal platelet aggregation (HCC); History of pre-eclampsia in prior pregnancy, currently pregnant; Maternal morbid obesity, antepartum (HCC); Supervision of high risk pregnancy, antepartum; and Chronic hypertension affecting pregnancy on their problem list.  Patient reports no complaints.  Contractions: Not present. Vag. Bleeding: None.  Movement: Present. Denies leaking of fluid.   The following portions of the patient's history were reviewed and updated as appropriate: allergies, current medications, past family history, past medical history, past social history, past surgical history and problem list. Problem list updated.  Objective:   Vitals:   05/05/21 1039  BP: 125/76  Pulse: (!) 102  Weight: 241 lb 4.8 oz (109.5 kg)    Fetal Status: Fetal Heart Rate (bpm): 158   Movement: Present     General:  Alert, oriented and cooperative. Patient is in no acute distress.  Skin: Skin is warm and dry. No rash noted.   Cardiovascular: Normal heart rate noted  Respiratory: Normal respiratory effort, no problems with respiration noted  Abdomen: Soft, gravid, appropriate for gestational age. Pain/Pressure: Absent     Pelvic: Vag. Bleeding: None     Cervical exam deferred        Extremities: Normal range of motion.  Edema: None  Mental Status: Normal mood and affect. Normal behavior. Normal judgment and thought content.   Urinalysis:      Assessment and Plan:  Pregnancy: [id number] at [redacted]w[redacted]d  1. Supervision of high risk pregnancy, antepartum BP and FHR normal Discussed contraception at length, would like long term contraception as not planning on more kids at the moment Discussed BTL and IUD at length, she is leaning more towards hormonal IUD Agrees  with AFP today - AFP, Serum, Open Spina Bifida  2. Chronic hypertension affecting pregnancy Stable off meds  3. History of pre-eclampsia in prior pregnancy, currently pregnant   4. Maternal morbid obesity, antepartum (HCC)   5. Abnormal platelet aggregation (HCC) See MFM note from 05/19/2013 in Care Everywhere Per their consultation on that date there had been concern about abnormal platelet studies in the past with inconclusive results performed by Peds Hematology (original workup due to heavy periods, nose bleeds) At that time they repeated them, results were normal, and they recommended routine prenatal/obstetrical care Unfortunately this has not been updated in [facility] chart and confusion has persisted in this pregnancy Will discuss ASA initiation with her given hx of PreE by telephone as these records were only reviewed after the visit  Preterm labor symptoms and general obstetric precautions including but not limited to vaginal bleeding, contractions, leaking of fluid and fetal movement were reviewed in detail with the patient. Please refer to After Visit Summary for other counseling recommendations.  Return in 4 weeks (on 06/02/2021) for [facility], ob visit.   Venora Maples, MD

## 2021-05-05 NOTE — Patient Instructions (Signed)

## 2021-05-07 LAB — AFP, SERUM, OPEN SPINA BIFIDA
AFP MoM: 0.6
AFP Value: 16 ng/mL
Gest. Age on Collection Date: 15.4 weeks
Maternal Age At EDD: 25.1 yr
OSBR Risk 1 IN: 10000
Test Results:: NEGATIVE
Weight: 241 [lb_av]

## 2021-05-08 ENCOUNTER — Telehealth: Payer: Self-pay | Admitting: Lactation Services

## 2021-05-08 ENCOUNTER — Telehealth: Payer: Self-pay

## 2021-05-08 NOTE — Telephone Encounter (Signed)
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   Advised mom to call the [employer] company at [phone] to set up a Telephone Genetic Counseling Session to discuss the results.   Reviewed it is recommended FOB be tested to see if he carries the same gene. She reports they have 2 other children together with no concerns. She will ask FOB if he would like to be tested.   Reviewed we have saliva testing kits in the office that she can pick up at her next appointment for testing if they would like.   Patient with no other questions or concerns at this time.

## 2021-05-08 NOTE — Telephone Encounter (Signed)
Call placed to pt. Spoke with pt. Pt given update per Dr Crissie Reese. Pt states would like to see Hematology first before taking ASA. Pt has appt with hematology appt on 05/12/21.  Pt advised will update Dr Crissie Reese and will make further recommendations after appt. Pt verbalized understanding to plan of care.  Judeth Cornfield, RN

## 2021-05-08 NOTE — Telephone Encounter (Signed)
-----   Message from Venora Maples, MD sent at 05/05/2021  8:51 PM EST ----- Regarding: Call patient This patient's chart said she couldn't take ASA but after digging into that its not true.  Can you call you on Monday and tell her based on prior notes she can and should take ASA. If she's ok with this please send her a prescription.  [employer]

## 2021-05-10 ENCOUNTER — Encounter: Payer: Self-pay | Admitting: Obstetrics & Gynecology

## 2021-05-10 DIAGNOSIS — D563 Thalassemia minor: Secondary | ICD-10-CM

## 2021-05-10 HISTORY — DX: Thalassemia minor: D56.3

## 2021-05-10 NOTE — Telephone Encounter (Signed)
Patient had spoke to RN regarding her [employer] results.

## 2021-05-17 NOTE — BH Specialist Note (Deleted)
Integrated Behavioral Health via Telemedicine Visit  05/17/2021 Samantha Stanton [id number]  Number of Integrated Behavioral Health visits: *** Session Start time: 9:45***  Session End time: 10:15*** Total time: {IBH Total Time:21014050}  Referring Provider: *** Patient/Family location: Home*** [facility] Provider location: [facility]  All persons participating in visit: Patient *** and Samantha Stanton ***  Types of Service: {CHL AMB TYPE OF [phone]}  I connected with Samantha Stanton and/or Samantha Stanton {family members:20773} via  Telephone or Video Enabled Telemedicine Application  (Video is Caregility application) and verified that I am speaking with the correct person using two identifiers. Discussed confidentiality: {YES/NO:21197}  I discussed the limitations of telemedicine and the availability of in person appointments.  Discussed there is a possibility of technology failure and discussed alternative modes of communication if that failure occurs.  I discussed that engaging in this telemedicine visit, they consent to the provision of behavioral healthcare and the services will be billed under their insurance.  Patient and/or legal guardian expressed understanding and consented to Telemedicine visit: {YES/NO:21197}  Presenting Concerns: Patient and/or family reports the following symptoms/concerns: *** Duration of problem: ***; Severity of problem: {Mild/Moderate/Severe:20260}  Patient and/or Family's Strengths/Protective Factors: {CHL AMB BH PROTECTIVE [phone]}  Goals Addressed: Patient will:  Reduce symptoms of: {IBH Symptoms:21014056}   Increase knowledge and/or ability of: {IBH Patient Tools:21014057}   Demonstrate ability to: {IBH Goals:21014053}  Progress towards Goals: {CHL AMB BH PROGRESS TOWARDS GOALS:[phone]}  Interventions: Interventions utilized:  {IBH  Interventions:21014054} Standardized Assessments completed: {IBH Screening Tools:21014051}  Patient and/or Family Response: ***  Assessment: Patient currently experiencing ***.   Patient may benefit from ***.  Plan: Follow up with behavioral health clinician on : *** Behavioral recommendations: *** Referral(s): {IBH Referrals:21014055}  I discussed the assessment and treatment plan with the patient and/or parent/guardian. They were provided an opportunity to ask questions and all were answered. They agreed with the plan and demonstrated an understanding of the instructions.   They were advised to call back or seek an in-person evaluation if the symptoms worsen or if the condition fails to improve as anticipated.  Samantha Close [name], LCSW

## 2021-05-31 NOTE — BH Specialist Note (Signed)
Integrated Behavioral Health via Telemedicine Visit  05/31/2021 Samantha Stanton [id number]  Number of Integrated Behavioral Health visits: 3 Session Start time: 9:48 Session End time: 10:12 Total time:  24  Referring Provider: Nettie Elm, MD Patient/Family location: Home [facility] Provider location: Center for [facility] at [facility] for Women  All persons participating in visit: [name] and [facility] [place]   Types of Service: Individual psychotherapy and Video visit  I connected with Pema Hoefer and/or Corneshia Dolata's  n/a  via  Telephone or Video Enabled Telemedicine Application  (Video is Caregility application) and verified that I am speaking with the correct person using two identifiers. Discussed confidentiality: Yes   I discussed the limitations of telemedicine and the availability of in person appointments.  Discussed there is a possibility of technology failure and discussed alternative modes of communication if that failure occurs.  I discussed that engaging in this telemedicine visit, they consent to the provision of behavioral healthcare and the services will be billed under their insurance.  Patient and/or legal guardian expressed understanding and consented to Telemedicine visit: Yes   Presenting Concerns: Patient and/or family reports the following symptoms/concerns: Grieving unexpected loss of baby at [redacted]w[redacted]d gestation; feeling numb and anxious with panic; poor appetite and poor sleep since loss. Duration of problem: Less than one week  Patient and/or Family's Strengths/Protective Factors: Social connections, Concrete supports in place (healthy food, safe environments, etc.), and Sense of purpose  Goals Addressed: Patient will:  Demonstrate ability to: Begin healthy grieving over loss  Progress towards Goals: Ongoing  Interventions: Interventions utilized:  Link to [employer] and Supportive Reflection Standardized  Assessments completed: Not Needed  Patient and/or Family Response: Pt agrees with treatment plan  Assessment: Patient currently experiencing Grief.   Patient may benefit from brief therapeutic intervention today.  Plan: Follow up with behavioral health clinician on : Two weeks Behavioral recommendations:  -Allow time and space to grieve  -Continue staying at mother's home for extra support, for as long as needed -Consider pregnancy loss support groups, for additional support at this time (listed on After Visit Summary) -Consider continuing taking prenatal vitamin until postpartum medical visit, along with prioritizing sleep, both as self-care during this time Referral(s): Integrated [employer] (In Clinic) and [employer]:  pregnancy loss support  I discussed the assessment and treatment plan with the patient and/or parent/guardian. They were provided an opportunity to ask questions and all were answered. They agreed with the plan and demonstrated an understanding of the instructions.   They were advised to call back or seek an in-person evaluation if the symptoms worsen or if the condition fails to improve as anticipated.  Valetta Close Mikeria Valin, LCSW

## 2021-06-02 ENCOUNTER — Other Ambulatory Visit: Payer: Self-pay

## 2021-06-02 ENCOUNTER — Ambulatory Visit: Payer: Medicaid Other | Attending: Obstetrics & Gynecology

## 2021-06-02 ENCOUNTER — Encounter: Payer: Self-pay | Admitting: Obstetrics and Gynecology

## 2021-06-02 ENCOUNTER — Encounter: Payer: Self-pay | Admitting: *Deleted

## 2021-06-02 ENCOUNTER — Ambulatory Visit (INDEPENDENT_AMBULATORY_CARE_PROVIDER_SITE_OTHER): Payer: Medicaid Other | Admitting: Obstetrics and Gynecology

## 2021-06-02 ENCOUNTER — Ambulatory Visit: Payer: Medicaid Other | Admitting: *Deleted

## 2021-06-02 VITALS — BP 138/82 | HR 80 | Wt 246.6 lb

## 2021-06-02 VITALS — BP 127/75 | HR 103

## 2021-06-02 DIAGNOSIS — O10919 Unspecified pre-existing hypertension complicating pregnancy, unspecified trimester: Secondary | ICD-10-CM

## 2021-06-02 DIAGNOSIS — O0990 Supervision of high risk pregnancy, unspecified, unspecified trimester: Secondary | ICD-10-CM | POA: Diagnosis present

## 2021-06-02 DIAGNOSIS — O09299 Supervision of pregnancy with other poor reproductive or obstetric history, unspecified trimester: Secondary | ICD-10-CM

## 2021-06-02 DIAGNOSIS — D691 Qualitative platelet defects: Secondary | ICD-10-CM

## 2021-06-02 DIAGNOSIS — D563 Thalassemia minor: Secondary | ICD-10-CM

## 2021-06-02 NOTE — Progress Notes (Signed)
Subjective:  Samantha Stanton is a 26 y.o. [id number] at [redacted]w[redacted]d being seen today for ongoing prenatal care.  She is currently monitored for the following issues for this high-risk pregnancy and has ADHD (attention deficit hyperactivity disorder); Abnormal platelet aggregation ([place]); History of pre-eclampsia in prior pregnancy, currently pregnant; Maternal morbid obesity, antepartum ([place]); Supervision of high risk pregnancy, antepartum; Chronic hypertension affecting pregnancy; and Alpha thalassemia silent carrier on their problem list.  Patient reports no complaints.  Contractions: Not present. Vag. Bleeding: None.  Movement: Present. Denies leaking of fluid.   The following portions of the patient's history were reviewed and updated as appropriate: allergies, current medications, past family history, past medical history, past social history, past surgical history and problem list. Problem list updated.  Objective:   Vitals:   06/02/21 0903  BP: 138/82  Pulse: 80  Weight: 246 lb 9.6 oz (111.9 kg)    Fetal Status:     Movement: Present     General:  Alert, oriented and cooperative. Patient is in no acute distress.  Skin: Skin is warm and dry. No rash noted.   Cardiovascular: Normal heart rate noted  Respiratory: Normal respiratory effort, no problems with respiration noted  Abdomen: Soft, gravid, appropriate for gestational age. Pain/Pressure: Absent     Pelvic:  Cervical exam deferred        Extremities: Normal range of motion.  Edema: None  Mental Status: Normal mood and affect. Normal behavior. Normal judgment and thought content.   Urinalysis:      Assessment and Plan:  Pregnancy: [id number] at [redacted]w[redacted]d  1. Supervision of high risk pregnancy, antepartum No FHT's by hand held U/S To MFM for confirmation  2. Chronic hypertension affecting pregnancy Stable  3. History of pre-eclampsia in prior pregnancy, currently pregnant   4. Alpha thalassemia silent carrier   5. Abnormal platelet  aggregation (HCC)   Preterm labor symptoms and general obstetric precautions including but not limited to vaginal bleeding, contractions, leaking of fluid and fetal movement were reviewed in detail with the patient. Please refer to After Visit Summary for other counseling recommendations.  Return in about 4 weeks (around 06/30/2021) for OB visit, face to face, MD only.   Chancy Milroy, MD

## 2021-06-02 NOTE — Progress Notes (Signed)
FDIU note at bedside U/S. Confirmed by U/S with MFM today as well. Pt visibly upset. Good support with family members present. Discussed need with IOL. Pt and or family to call office on Monday to set up time/date for admission to [facility] for IOL. Appropriate grief support provided to pt and family

## 2021-06-05 ENCOUNTER — Telehealth: Payer: Self-pay

## 2021-06-05 NOTE — Telephone Encounter (Signed)
Called pt in regards to pt's mother calling about pt having IOL scheduled for the demise.  Pt

## 2021-06-06 NOTE — Telephone Encounter (Signed)
Mother of patient, Ola Spurr, called L&D to confirm admission for IOL for IUFD. Discussed with patient's mother plan for induction on 06/07/21 at [place] on [facility] specialty care and check-in instructions. Questions were answered to their satisfaction, support for grief given. Mother stated that she may be a little late due to needing to drop off patient's child at school prior to coming in but will arrive before [place], DO 06/06/2021 12:59 PM

## 2021-06-07 ENCOUNTER — Other Ambulatory Visit: Payer: Self-pay

## 2021-06-07 ENCOUNTER — Encounter (HOSPITAL_COMMUNITY): Payer: Self-pay | Admitting: Obstetrics & Gynecology

## 2021-06-07 ENCOUNTER — Observation Stay (HOSPITAL_COMMUNITY)
Admission: AD | Admit: 2021-06-07 | Discharge: 2021-06-09 | Disposition: A | Discharge status: Home / Self Care | Payer: Medicaid Other | Attending: Obstetrics & Gynecology | Admitting: Obstetrics & Gynecology

## 2021-06-07 DIAGNOSIS — Z3A20 20 weeks gestation of pregnancy: Secondary | ICD-10-CM | POA: Diagnosis not present

## 2021-06-07 DIAGNOSIS — Z3A17 17 weeks gestation of pregnancy: Secondary | ICD-10-CM | POA: Diagnosis not present

## 2021-06-07 DIAGNOSIS — Z20822 Contact with and (suspected) exposure to covid-19: Secondary | ICD-10-CM | POA: Insufficient documentation

## 2021-06-07 DIAGNOSIS — I10 Essential (primary) hypertension: Secondary | ICD-10-CM | POA: Insufficient documentation

## 2021-06-07 DIAGNOSIS — O364XX0 Maternal care for intrauterine death, not applicable or unspecified: Principal | ICD-10-CM | POA: Insufficient documentation

## 2021-06-07 DIAGNOSIS — O021 Missed abortion: Secondary | ICD-10-CM | POA: Diagnosis present

## 2021-06-07 LAB — URINALYSIS, ROUTINE W REFLEX MICROSCOPIC
Bilirubin Urine: NEGATIVE
Glucose, UA: NEGATIVE mg/dL
Hgb urine dipstick: NEGATIVE
Ketones, ur: NEGATIVE mg/dL
Nitrite: NEGATIVE
Protein, ur: NEGATIVE mg/dL
Specific Gravity, Urine: 1.02 (ref 1.005–1.030)
pH: 7 (ref 5.0–8.0)

## 2021-06-07 LAB — URINALYSIS, MICROSCOPIC (REFLEX)

## 2021-06-07 LAB — CBC
HCT: 36.2 % (ref 36.0–46.0)
Hemoglobin: 11.6 g/dL — ABNORMAL LOW (ref 12.0–15.0)
MCH: 27.4 pg (ref 26.0–34.0)
MCHC: 32 g/dL (ref 30.0–36.0)
MCV: 85.4 fL (ref 80.0–100.0)
Platelets: 287 10*3/uL (ref 150–400)
RBC: 4.24 MIL/uL (ref 3.87–5.11)
RDW: 15.5 % (ref 11.5–15.5)
WBC: 6 10*3/uL (ref 4.0–10.5)
nRBC: 0 % (ref 0.0–0.2)

## 2021-06-07 LAB — RESP PANEL BY RT-PCR (FLU A&B, COVID) ARPGX2
Influenza A by PCR: NEGATIVE
Influenza B by PCR: NEGATIVE
SARS Coronavirus 2 by RT PCR: NEGATIVE

## 2021-06-07 LAB — TYPE AND SCREEN
ABO/RH(D): A POS
Antibody Screen: NEGATIVE

## 2021-06-07 MED ORDER — MISOPROSTOL 200 MCG PO TABS
400.0000 ug | ORAL_TABLET | ORAL | Status: DC
  Administered 2021-06-07 – 2021-06-08 (×3): 400 ug via BUCCAL
  Filled 2021-06-07 (×3): qty 2

## 2021-06-07 MED ORDER — MISOPROSTOL 200 MCG PO TABS
400.0000 ug | ORAL_TABLET | ORAL | Status: DC
  Administered 2021-06-07: 400 ug via VAGINAL
  Filled 2021-06-07: qty 2

## 2021-06-07 MED ORDER — ONDANSETRON HCL 4 MG/2ML IJ SOLN
4.0000 mg | Freq: Four times a day (QID) | INTRAMUSCULAR | Status: DC | PRN
  Administered 2021-06-07 – 2021-06-08 (×2): 4 mg via INTRAVENOUS
  Filled 2021-06-07 (×2): qty 2

## 2021-06-07 MED ORDER — ZOLPIDEM TARTRATE 5 MG PO TABS
5.0000 mg | ORAL_TABLET | Freq: Every evening | ORAL | Status: DC | PRN
  Administered 2021-06-08: 5 mg via ORAL
  Filled 2021-06-07: qty 1

## 2021-06-07 MED ORDER — HYDROMORPHONE HCL 1 MG/ML IJ SOLN
1.0000 mg | INTRAMUSCULAR | Status: DC | PRN
  Administered 2021-06-07: 1 mg via INTRAVENOUS
  Administered 2021-06-07 – 2021-06-08 (×3): 2 mg via INTRAVENOUS
  Administered 2021-06-08: 1 mg via INTRAVENOUS
  Filled 2021-06-07 (×3): qty 1
  Filled 2021-06-07: qty 2
  Filled 2021-06-07: qty 1
  Filled 2021-06-07: qty 2

## 2021-06-07 MED ORDER — MISOPROSTOL 200 MCG PO TABS
200.0000 ug | ORAL_TABLET | ORAL | Status: DC
  Administered 2021-06-07 (×2): 200 ug via VAGINAL
  Filled 2021-06-07 (×2): qty 1

## 2021-06-07 MED ORDER — LACTATED RINGERS IV SOLN: INTRAVENOUS | Status: DC

## 2021-06-07 MED ORDER — ACETAMINOPHEN 325 MG PO TABS
650.0000 mg | ORAL_TABLET | ORAL | Status: DC | PRN
  Administered 2021-06-07 – 2021-06-08 (×3): 650 mg via ORAL
  Filled 2021-06-07 (×4): qty 2

## 2021-06-07 NOTE — Plan of Care (Signed)
Problem: Education: °Goal: Knowledge of General Education information will improve °Description: Including pain rating scale, medication(s)/side effects and non-pharmacologic comfort measures °Outcome: Completed/Met °  °

## 2021-06-07 NOTE — Progress Notes (Signed)
Initial visit with [facility] and her boyfriend and mother to introduce spiritual care and offer support as she prepares for labor and delivery of her daughter, Noland Fordyce. Chaplain asked open ended questions to facilitate story telling and emotional expression, provided brief orientation to support available for demises and facilitated procurement of comfort cart. Will continue to follow.  Please page as further needs arise.  Donald Prose. Elyn Peers, M.Div. [facility] Chaplain Pager [phone] Office [phone]

## 2021-06-07 NOTE — H&P (Signed)
Samantha Stanton is a 26 y.o. female presenting for IOL for fetal demise diagnosed by [place] last week at the time of her scheduled MFM anatomy scan.. OB History     Gravida  6   Para  3   Term  3   Preterm      AB  2   Living  3      SAB  1   IAB  1   Ectopic      Multiple  0   Live Births  3          Past Medical History:  Diagnosis Date   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  Consider MFM consult.   Alpha thalassemia silent carrier 05/10/2021   Anxiety    Bleeding disorder (HCC)    Cannot take ASA   Chlamydia infection affecting pregnancy 2013   Hypertension    Gestational HTN   Menorrhagia 11/11/2018   Pregnancy induced hypertension    with last pregnancy   Past Surgical History:  Procedure Laterality Date   DILATION AND CURETTAGE OF UTERUS     WISDOM TOOTH EXTRACTION     Family History: family history includes Cancer in her sister; Diabetes in her mother; Hypertension in her mother. Social History:  reports that she has never smoked. She has never used smokeless tobacco. She reports that she does not currently use alcohol. She reports that she does not use drugs.   Review of Systems Maternal Medical History:  Reason for admission: Fetal demise with [place] 16-17 week range at [redacted]w[redacted]d buy LMP and 5.5 week US   Fetal activity: Perceived fetal activity is none.   Prenatal Complications - Diabetes: none.    Blood pressure 136/68, pulse 92, temperature 99.1 F (37.3 C), temperature source Oral, resp. rate 17, last menstrual period 01/17/2021, SpO2 98 %, unknown if currently breastfeeding. Maternal Exam:  Abdomen: Patient reports no abdominal tenderness. Introitus: Normal vulva. Pelvis: adequate for delivery.    Physical Exam Vitals and nursing note reviewed. Exam conducted with a chaperone present.  Constitutional:      Appearance: Normal appearance.   HENT:     Head: Normocephalic.  Cardiovascular:     Rate and Rhythm: Normal rate.  Pulmonary:     Effort: Pulmonary effort is normal.  Abdominal:     Palpations: Abdomen is soft.  Genitourinary:    General: Normal vulva.  Musculoskeletal:     Cervical back: Normal range of motion.  Skin:    General: Skin is warm and dry.  Neurological:     General: No focal deficit present.     Mental Status: She is alert.  Psychiatric:        Mood and Affect: Mood normal.        Behavior: Behavior normal.    Prenatal labs: ABO, Rh: A/Positive/-- (11/11 1026) Antibody: Negative (11/11 1026) Rubella: 3.76 (11/11 1026) RPR: Reactive (11/11 1026)  HBsAg: Negative (11/11 1026)  HIV: Non Reactive (11/11 1026)  GBS:     Assessment/Plan: Patient admitted for IOL with fetal demise, will initiated Cytotec PV Q4 H   Scheryl Darter 06/07/2021, 9:34 AM

## 2021-06-07 NOTE — Progress Notes (Signed)
Elba Schaber is a 26 y.o. [phone] with IUFD  ~[redacted]w[redacted]d as per ultrasound, admitted for IOL   Subjective: Patient is doing well, complains of some pain but alleviated by Dilaudid,  Objective: BP 134/73 (BP Location: Right Arm)    Pulse 92    Temp 98.8 F (37.1 C)    Resp 17    LMP 01/17/2021    SpO2 99%  Patient is in no distress  Misoprostol 400 mcg administered buccally around 2300, ordered q4h.  Labs: Lab Results  Component Value Date   WBC 6.0 06/07/2021   HGB 11.6 (L) 06/07/2021   HCT 36.2 06/07/2021   MCV 85.4 06/07/2021   PLT 287 06/07/2021    Assessment / Plan: Induction of labor due to fetal demise,  doing well. Will continue Misoprostol 400 mcg BC q4h for now Continue clear liquids only, discussed with patient about potential for surgical management if hemorrhaging, other maternal instability, prolonged retained placenta etc. Continue Dilaudid IV prn pain Appropriate support given to patient.    Jaynie Collins, MD, FACOG Obstetrician & Gynecologist, [facility] for [employer], [facility]

## 2021-06-08 ENCOUNTER — Observation Stay (HOSPITAL_COMMUNITY): Payer: Medicaid Other | Admitting: Certified Registered Nurse Anesthetist

## 2021-06-08 ENCOUNTER — Encounter (HOSPITAL_COMMUNITY)
Admission: AD | Disposition: A | Discharge status: Home / Self Care | Payer: Self-pay | Source: Home / Self Care | Attending: Obstetrics & Gynecology

## 2021-06-08 ENCOUNTER — Encounter (HOSPITAL_COMMUNITY): Payer: Self-pay | Admitting: Obstetrics & Gynecology

## 2021-06-08 DIAGNOSIS — O364XX0 Maternal care for intrauterine death, not applicable or unspecified: Secondary | ICD-10-CM | POA: Diagnosis not present

## 2021-06-08 DIAGNOSIS — O021 Missed abortion: Secondary | ICD-10-CM

## 2021-06-08 DIAGNOSIS — I10 Essential (primary) hypertension: Secondary | ICD-10-CM | POA: Diagnosis not present

## 2021-06-08 DIAGNOSIS — Z20822 Contact with and (suspected) exposure to covid-19: Secondary | ICD-10-CM | POA: Diagnosis not present

## 2021-06-08 HISTORY — PX: DILATION AND EVACUATION: SHX1459

## 2021-06-08 SURGERY — DILATION AND EVACUATION, UTERUS
Anesthesia: General

## 2021-06-08 MED ORDER — FENTANYL CITRATE (PF) 100 MCG/2ML IJ SOLN
INTRAMUSCULAR | Status: AC
  Filled 2021-06-08: qty 2

## 2021-06-08 MED ORDER — ACETAMINOPHEN 160 MG/5ML PO SOLN: 1000.0000 mg | Freq: Once | ORAL | Status: DC | PRN

## 2021-06-08 MED ORDER — ACETAMINOPHEN 10 MG/ML IV SOLN
INTRAVENOUS | Status: AC
  Filled 2021-06-08: qty 100

## 2021-06-08 MED ORDER — ONDANSETRON HCL 4 MG/2ML IJ SOLN
INTRAMUSCULAR | Status: DC | PRN
  Administered 2021-06-08: 4 mg via INTRAVENOUS

## 2021-06-08 MED ORDER — SUCCINYLCHOLINE CHLORIDE 200 MG/10ML IV SOSY
PREFILLED_SYRINGE | INTRAVENOUS | Status: DC | PRN
  Administered 2021-06-08: 140 mg via INTRAVENOUS

## 2021-06-08 MED ORDER — ONDANSETRON HCL 4 MG/2ML IJ SOLN
4.0000 mg | Freq: Four times a day (QID) | INTRAMUSCULAR | Status: DC | PRN
  Administered 2021-06-08: 4 mg via INTRAVENOUS
  Filled 2021-06-08: qty 2

## 2021-06-08 MED ORDER — POVIDONE-IODINE 10 % EX SWAB
2.0000 "application " | Freq: Once | CUTANEOUS | Status: AC
  Administered 2021-06-08: 2 via TOPICAL

## 2021-06-08 MED ORDER — SOD CITRATE-CITRIC ACID 500-334 MG/5ML PO SOLN
30.0000 mL | Freq: Once | ORAL | Status: AC
  Filled 2021-06-08: qty 30

## 2021-06-08 MED ORDER — SUCCINYLCHOLINE CHLORIDE 200 MG/10ML IV SOSY
PREFILLED_SYRINGE | INTRAVENOUS | Status: AC
  Filled 2021-06-08: qty 10

## 2021-06-08 MED ORDER — ONDANSETRON HCL 4 MG/2ML IJ SOLN
INTRAMUSCULAR | Status: AC
  Filled 2021-06-08: qty 2

## 2021-06-08 MED ORDER — DEXAMETHASONE SODIUM PHOSPHATE 4 MG/ML IJ SOLN
INTRAMUSCULAR | Status: DC | PRN
  Administered 2021-06-08: 8 mg via INTRAVENOUS

## 2021-06-08 MED ORDER — PROPOFOL 10 MG/ML IV BOLUS
INTRAVENOUS | Status: AC
  Filled 2021-06-08: qty 20

## 2021-06-08 MED ORDER — LACTATED RINGERS IV SOLN: INTRAVENOUS | Status: DC

## 2021-06-08 MED ORDER — DIPHENHYDRAMINE HCL 25 MG PO CAPS
25.0000 mg | ORAL_CAPSULE | Freq: Four times a day (QID) | ORAL | Status: DC | PRN
  Administered 2021-06-08 (×2): 25 mg via ORAL
  Filled 2021-06-08 (×2): qty 1

## 2021-06-08 MED ORDER — MIDAZOLAM HCL 2 MG/2ML IJ SOLN
INTRAMUSCULAR | Status: AC
  Filled 2021-06-08: qty 2

## 2021-06-08 MED ORDER — OXYCODONE HCL 5 MG PO TABS: 5.0000 mg | ORAL_TABLET | Freq: Once | ORAL | Status: DC | PRN

## 2021-06-08 MED ORDER — SOD CITRATE-CITRIC ACID 500-334 MG/5ML PO SOLN
ORAL | Status: AC
  Administered 2021-06-08: 30 mL
  Filled 2021-06-08: qty 30

## 2021-06-08 MED ORDER — OXYCODONE-ACETAMINOPHEN 5-325 MG PO TABS
1.0000 | ORAL_TABLET | Freq: Four times a day (QID) | ORAL | 0 refills | Status: DC | PRN
Start: 2021-06-08 — End: 2021-06-29

## 2021-06-08 MED ORDER — ONDANSETRON HCL 4 MG PO TABS: 4.0000 mg | ORAL_TABLET | Freq: Four times a day (QID) | ORAL | Status: DC | PRN

## 2021-06-08 MED ORDER — OXYCODONE HCL 5 MG/5ML PO SOLN: 5.0000 mg | Freq: Once | ORAL | Status: DC | PRN

## 2021-06-08 MED ORDER — ACETAMINOPHEN 10 MG/ML IV SOLN
INTRAVENOUS | Status: DC | PRN
  Administered 2021-06-08: 1000 mg via INTRAVENOUS

## 2021-06-08 MED ORDER — DEXAMETHASONE SODIUM PHOSPHATE 10 MG/ML IJ SOLN
INTRAMUSCULAR | Status: AC
  Filled 2021-06-08: qty 1

## 2021-06-08 MED ORDER — ACETAMINOPHEN 10 MG/ML IV SOLN: 1000.0000 mg | Freq: Once | INTRAVENOUS | Status: DC | PRN

## 2021-06-08 MED ORDER — MIDAZOLAM HCL 5 MG/5ML IJ SOLN
INTRAMUSCULAR | Status: DC | PRN
  Administered 2021-06-08: 2 mg via INTRAVENOUS

## 2021-06-08 MED ORDER — ACETAMINOPHEN 500 MG PO TABS: 1000.0000 mg | ORAL_TABLET | Freq: Once | ORAL | Status: DC | PRN

## 2021-06-08 MED ORDER — FENTANYL CITRATE (PF) 100 MCG/2ML IJ SOLN: 25.0000 ug | INTRAMUSCULAR | Status: DC | PRN

## 2021-06-08 MED ORDER — DOXYCYCLINE HYCLATE 100 MG IV SOLR
200.0000 mg | INTRAVENOUS | Status: AC
  Administered 2021-06-08: 200 mg via INTRAVENOUS
  Filled 2021-06-08: qty 200

## 2021-06-08 MED ORDER — PHENYLEPHRINE HCL (PRESSORS) 10 MG/ML IV SOLN
INTRAVENOUS | Status: DC | PRN
  Administered 2021-06-08: 80 ug via INTRAVENOUS

## 2021-06-08 MED ORDER — LIDOCAINE HCL (CARDIAC) PF 100 MG/5ML IV SOSY
PREFILLED_SYRINGE | INTRAVENOUS | Status: DC | PRN
  Administered 2021-06-08: 80 mg via INTRAVENOUS

## 2021-06-08 MED ORDER — PROPOFOL 10 MG/ML IV BOLUS
INTRAVENOUS | Status: DC | PRN
Start: 2021-06-08 — End: 2021-06-08
  Administered 2021-06-08: 160 mg via INTRAVENOUS

## 2021-06-08 MED ORDER — FENTANYL CITRATE (PF) 100 MCG/2ML IJ SOLN
INTRAMUSCULAR | Status: DC | PRN
  Administered 2021-06-08: 100 ug via INTRAVENOUS

## 2021-06-08 MED ORDER — LIDOCAINE 2% (20 MG/ML) 5 ML SYRINGE
INTRAMUSCULAR | Status: AC
  Filled 2021-06-08: qty 5

## 2021-06-08 SURGICAL SUPPLY — 18 items
CATH ROBINSON RED A/P 16FR (CATHETERS) ×2 IMPLANT
CLOTH BEACON ORANGE TIMEOUT ST (SAFETY) ×2 IMPLANT
DECANTER SPIKE VIAL GLASS SM (MISCELLANEOUS) IMPLANT
GLOVE BIO SURGEON STRL SZ 6.5 (GLOVE) ×2 IMPLANT
GLOVE BIOGEL PI IND STRL 7.0 (GLOVE) ×2 IMPLANT
GLOVE BIOGEL PI INDICATOR 7.0 (GLOVE) ×2
GOWN STRL REUS W/TWL LRG LVL3 (GOWN DISPOSABLE) ×4 IMPLANT
KIT BERKELEY 1ST TRIMESTER 3/8 (MISCELLANEOUS) IMPLANT
NS IRRIG 1000ML POUR BTL (IV SOLUTION) ×2 IMPLANT
PACK VAGINAL MINOR WOMEN LF (CUSTOM PROCEDURE TRAY) ×2 IMPLANT
PAD OB MATERNITY 4.3X12.25 (PERSONAL CARE ITEMS) ×2 IMPLANT
PAD PREP 24X48 CUFFED NSTRL (MISCELLANEOUS) ×2 IMPLANT
SET BERKELEY SUCTION TUBING (SUCTIONS) IMPLANT
TOWEL OR 17X24 6PK STRL BLUE (TOWEL DISPOSABLE) ×4 IMPLANT
VACURETTE 10 RIGID CVD (CANNULA) IMPLANT
VACURETTE 7MM CVD STRL WRAP (CANNULA) IMPLANT
VACURETTE 8 RIGID CVD (CANNULA) IMPLANT
VACURETTE 9 RIGID CVD (CANNULA) IMPLANT

## 2021-06-08 NOTE — Progress Notes (Signed)
Zarra Geffert is a 26 y.o. [phone] with IUFD  ~[redacted]w[redacted]d as per ultrasound, admitted for IOL   Subjective: Patient reports increased pain and some pressure. Desires more Dilaudid  Objective: BP 130/75    Pulse (!) 104    Temp 98.8 F (37.1 C) (Oral)    Resp 18    LMP 01/17/2021    SpO2 100%  Sterile cervical exam: BBOW and clots noted in vagina. Patient urged to push. This resulted in delivery on nonviable fetus and copious brown fluid at 0615.  Placenta still noted to be high up inside uterus.  Cord was clamped and cut.  Fetus has been deceased for a significant amount of time, corresponding to the estimated [redacted] weeks gestation age at demise. Total EBL was about 300 ml.   Labs: Lab Results  Component Value Date   WBC 6.0 06/07/2021   HGB 11.6 (L) 06/07/2021   HCT 36.2 06/07/2021   MCV 85.4 06/07/2021   PLT 287 06/07/2021   Assessment / Plan: Patient is now s/p delivery of nonviable fetus, placenta still in place Will continue Misoprostol 400 mcg BC q4h for now Continue clear sips only, discussed with patient about potential for surgical management if hemorrhaging, other maternal instability, prolonged retained placenta etc. OR team aware. Continue Dilaudid IV prn pain Appropriate support given to patient and her FOB.    Jaynie Collins, MD, FACOG Obstetrician & Gynecologist, [facility] for [employer], [facility]

## 2021-06-08 NOTE — Anesthesia Procedure Notes (Signed)
Procedure Name: Intubation Date/Time: 06/08/2021 12:32 PM Performed by: Cleda Clarks, CRNA Pre-anesthesia Checklist: Patient identified, Emergency Drugs available, Suction available and Patient being monitored Patient Re-evaluated:Patient Re-evaluated prior to induction Oxygen Delivery Method: Circle system utilized Preoxygenation: Pre-oxygenation with 100% oxygen Induction Type: IV induction, Rapid sequence and Cricoid Pressure applied Ventilation: Mask ventilation without difficulty Laryngoscope Size: Miller and 2 Tube type: Oral Tube size: 7.0 mm Number of attempts: 1 Airway Equipment and Method: Stylet and Oral airway Placement Confirmation: ETT inserted through vocal cords under direct vision, positive ETCO2 and breath sounds checked- equal and bilateral Secured at: 21 cm Tube secured with: Tape Dental Injury: Teeth and Oropharynx as per pre-operative assessment

## 2021-06-08 NOTE — Progress Notes (Signed)
CSW received consult due to IUFD.  CSW available for support secondary to [facility] and will await call from Chaplain before becoming involved.  CSW screening out referral at this time.  CSW spoke with Lazarus Salines via telephone.  Candelaria Stagers will provide community resources and supports for MOB and family post discharge. Chaplin agreed to follow-up CSW if additional resources and supports are needed.   Blaine Hamper, MSW, LCSW Clinical Social Work [phone]

## 2021-06-08 NOTE — Progress Notes (Signed)
Follow up visit with [facility] and her family. The family's mood is sad and affect is broad as they are finding moments of levity to carry them through the work of this difficult situation. Brayah shared that she has been tearful for the last five days even prior to learning about Story's death. She has steeled herself for the work of delivery. Chaplain named the importance of rest and reprieve in the midst of difficult emotional situations and commended the family's support of one another. Sarahjane began exploring her concerns about how to support her 25 year old son and tell him about his sister's death. Chaplain provided initial education about supporting children through grief. Will continue as patient is ready.  Please page as further needs arise.  Maryanna Shape. Carley Hammed, M.Div. [facility] Chaplain Pager [phone] Office [phone]

## 2021-06-08 NOTE — Progress Notes (Signed)
Chaplain followed up with patient throughout the day. Chaplain met with pt's mother in the hallway after [facility] delivered baby Story. Chaplain provided reflective listening as she explored her grief over her granddaughter and worry about her daughter.  Chaplain followed up with [facility] for brief reflection on her delivery. Samantha Stanton expressed gratitude for the visit. Shared that delivery was difficult because she was not ready and that she had not yet held Story, but still wanted to. Chaplain affirmed pt's need to enter this process slowly. Pt received a phone call during our visit that she needed to take and requested follow up this afternoon.  Chaplain returned and pt had been taken to the OR. Chaplain spent time with FOB reflecting on the procedure. He shared his concern that [facility] had been gone longer than he thought she would be. Chaplain consulted with medical staff to help ease FOB's concerns. Samantha Stanton was brought back to her room shortly after. She was drowsy. Chaplain provided follow up grief education to FOB reminding him that chaplain is available after pt is discharged if she would like to talk more when she is more alert. FOB confirmed he knew how to reach chaplain. We also discussed community resources for support including Hospice grief counseling, programming, and [place] support group. Chaplain also provided info on PMADs vs perinatal grief or baby blues and warning signs. Pt previously named history of anxiety and depression and her mother shared with chaplain that she also struggles, but is able to make herself do what she needs to. Chaplain reframed and educated that all individuals are not able to simply will themselves out and encouraged family to seek support as needed and reminded them that they need not wait until the 6 week follow up appointment.   Chaplain also provided grief resources and education for children. FOB stated the information was new to him and helpful and he could already  see places where their story was reflected in the material. Chaplain affirmed his intuition, communication, and support of his partner.  Samantha Stanton woke briefly and chaplain provided a brief update about materials left behind, encouraged pt to sleep rather than fight it, and welcomed her to follow up as needed.  Please page as further needs arise.  Donald Prose. Elyn Peers, M.Div. [facility] Chaplain Pager [phone] Office [phone]

## 2021-06-08 NOTE — Op Note (Signed)
Samantha Stanton PROCEDURE DATE: 06/08/2021  PREOPERATIVE DIAGNOSIS: 17-18 week fetal demise, retained placenta after delivery of fetus POSTOPERATIVE DIAGNOSIS: The same PROCEDURE:     Uterine Evacuation SURGEON:  Scheryl Darter MD  INDICATIONS: 26 y.o. [phone] with fetal demise at approximately 17 weeks by [place] with fetal demise, with retained placenta after induced termination needing surgical completion.  Risks of surgery were discussed with the patient including but not limited to: bleeding which may require transfusion; infection which may require antibiotics; injury to uterus or surrounding organs; need for additional procedures including laparotomy or laparoscopy; possibility of intrauterine scarring which may impair future fertility; and other postoperative/anesthesia complications. Written informed consent was obtained.    FINDINGS:  A 12 week size uterus, large amounts of products of conception, specimen sent to pathology.  ANESTHESIA:   GET INTRAVENOUS FLUIDS:  500 ml of LR ESTIMATED BLOOD LOSS:  Less than 20 ml. SPECIMENS:  Products of conception sent to pathology COMPLICATIONS:  None immediate.  PROCEDURE DETAILS:  The patient received intravenous Doxycycline in the operative area.  She was then taken to the operating room where GET anesthesia was administered and was found to be adequate.  After an adequate timeout was performed, she was placed in the dorsal lithotomy position and examined; then prepped and draped in the sterile manner.   Her bladder was catheterized for an unmeasured amount of clear, yellow urine. A vaginal speculum was then placed in the patient's vagina and ring forceps was applied to the anterior lip of the cervix.  The cervix was dilated sufficiently to accommodate a 16 mm suction curette that was gently advanced to the uterine fundus.  The suction device was then activated and curette slowly rotated to clear the uterus of products of conception. There was minimal  bleeding noted and the clamp was removed with good hemostasis noted.   All instruments were removed from the patient's vagina.  Sponge and instrument counts were correct times two  The patient tolerated the procedure well and was taken to the recovery area awake, and in stable condition.   Adam Phenix, MD 06/08/2021 1:21 PM

## 2021-06-08 NOTE — Transfer of Care (Signed)
Immediate Anesthesia Transfer of Care Note  Patient: Samantha Stanton  Procedure(s) Performed: DILATATION AND EVACUATION  Patient Location: PACU  Anesthesia Type:General  Level of Consciousness: awake, alert  and oriented  Airway & Oxygen Therapy: Patient Spontanous Breathing and Patient connected to nasal cannula oxygen  Post-op Assessment: Report given to RN and Post -op Vital signs reviewed and stable  Post vital signs: Reviewed and stable  Last Vitals:  Vitals Value Taken Time  BP    Temp    Pulse 102 06/08/21 1312  Resp 26 06/08/21 1312  SpO2 100 % 06/08/21 1312  Vitals shown include unvalidated device data.  Last Pain:  Vitals:   06/08/21 1204  TempSrc: Oral  PainSc:       Patients Stated Pain Goal: 3 ([id number])  Complications: No notable events documented.

## 2021-06-08 NOTE — Discharge Summary (Signed)
Postpartum Discharge Summary  Date of Service updated 06/08/2021    Patient Name: Samantha Stanton DOB: [birth year] MRN: [id number]  Date of admission: 06/07/2021 Delivery date:This patient has no babies on file. Delivering provider: This patient has no babies on file. Date of discharge: 06/08/2021  Admitting diagnosis: Fetal demise before 20 weeks with retention of dead fetus [O02.1] Intrauterine pregnancy: [redacted]w[redacted]d     Secondary diagnosis:  Principal Problem:   Fetal demise before 20 weeks with retention of dead fetus Active Problems:   Fetal demise before 18 weeks with retention of dead fetus  Additional problems: retained placenta    Discharge diagnosis:  Fetal demise                                                Post partum procedures: suction curettage Augmentation: Cytotec Complications: [facility] course: 26 y.o. [id number] Samantha Stanton is a 26 y.o. female presenting for IOL for fetal demise diagnosed by [place] last week at the time of her scheduled MFM anatomy scan. She was admitted for cytotec induction with fetal measurements approximately 17 weeks. She delivered 06/08/2021 and suction curettage was done for retained placenta.  Magnesium Sulfate received: No Rhophylac:N/A MMR:No T-DaP: no Flu: No Transfusion:No  Physical exam  Vitals:   06/08/21 1035 06/08/21 1204 06/08/21 1310 06/08/21 1315  BP: 134/74 136/61 138/69 132/67  Pulse: 97 95 99 98  Resp:  16 (!) 28 (!) 23  Temp: 99.4 F (37.4 C) 99.7 F (37.6 C)    TempSrc: Oral Oral    SpO2: 98% 98% 100% 100%  Weight:      Height:       General: alert, cooperative, and no distress Lochia: appropriate Uterine Fundus: firm Incision: N/A DVT Evaluation: No evidence of DVT seen on physical exam. Labs: Lab Results  Component Value Date   WBC 6.0 06/07/2021   HGB 11.6 (L) 06/07/2021   HCT 36.2 06/07/2021   MCV 85.4 06/07/2021   PLT 287 06/07/2021   CMP Latest Ref Rng & Units 04/07/2021  Glucose 70 - 99  mg/dL 78  BUN 6 - 20 mg/dL 6  Creatinine 0.57 - 1.00 mg/dL 0.60  Sodium 134 - 144 mmol/L 136  Potassium 3.5 - 5.2 mmol/L 4.2  Chloride 96 - 106 mmol/L 103  CO2 20 - 29 mmol/L 20  Calcium 8.7 - 10.2 mg/dL 9.4  Total Protein 6.0 - 8.5 g/dL 7.0  Total Bilirubin 0.0 - 1.2 mg/dL <0.2  Alkaline Phos 44 - 121 IU/L 44  AST 0 - 40 IU/L 19  ALT 0 - 32 IU/L 29   Edinburgh Score: Edinburgh Postnatal Depression Scale Screening Tool 01/13/2019  I have been able to laugh and see the funny side of things. 0  I have looked forward with enjoyment to things. 0  I have blamed myself unnecessarily when things went wrong. 2  I have been anxious or worried for no good reason. 2  I have felt scared or panicky for no good reason. 3  Things have been getting on top of me. 1  I have been so unhappy that I have had difficulty sleeping. 0  I have felt sad or miserable. 1  I have been so unhappy that I have been crying. 2  The thought of harming myself has occurred to me. 0  Edinburgh Postnatal Depression  Scale Total 11     After visit meds:  Allergies as of 06/08/2021       Reactions   Aspirin Other (See Comments)   Causes nose bleeds and frequent periods - age 26. Saw hematologist and told not to take ASA        Medication List     STOP taking these medications    aspirin EC 81 MG tablet   Blood Pressure Monitoring Devi   docusate sodium 250 MG capsule Commonly known as: COLACE   ferrous sulfate 324 (65 Fe) MG Tbec   prenatal multivitamin Tabs tablet       TAKE these medications    oxyCODONE-acetaminophen 5-325 MG tablet Commonly known as: PERCOCET/ROXICET Take 1-2 tablets by mouth every 6 (six) hours as needed for severe pain.         Discharge home in stable condition  Infant Disposition:pathology Discharge instruction: per After Visit Summary and Postpartum booklet. Activity: Advance as tolerated. Pelvic rest for 6 weeks.  Diet: routine diet Future  Appointments: Future Appointments  Date Time Provider [place]  06/14/2021  9:45 AM [place] [facility]   Follow up Visit:  [place] at [facility] Follow up in 2 week(s).   Specialty: Obstetrics and Gynecology Contact information: [place] [phone]                 Please schedule this patient for a In person postpartum visit in  2 week  with the following provider: MD. Additional Postpartum F/U:Postpartum Depression checkup  High risk pregnancy complicated by: HTN Anticipated Birth Control:  Unsure   06/08/2021 Emeterio Reeve, MD

## 2021-06-08 NOTE — Anesthesia Preprocedure Evaluation (Addendum)
Anesthesia Evaluation  Patient identified by MRN, date of birth, ID band Patient awake    Reviewed: Allergy & Precautions, NPO status , Patient's Chart, lab work & pertinent test results  History of Anesthesia Complications Negative for: history of anesthetic complications  Airway Mallampati: III  TM Distance: >3 FB Neck ROM: Full    Dental  (+) Teeth Intact, Dental Advisory Given   Pulmonary neg pulmonary ROS,    breath sounds clear to auscultation       Cardiovascular hypertension,  Rhythm:Regular     Neuro/Psych PSYCHIATRIC DISORDERS Anxiety negative neurological ROS     GI/Hepatic negative GI ROS, Neg liver ROS,   Endo/Other  Morbid obesity  Renal/GU negative Renal ROS     Musculoskeletal   Abdominal   Peds  Hematology   Anesthesia Other Findings   Reproductive/Obstetrics (+) Pregnancy 18 weeks delivered, retained placenta                            Anesthesia Physical Anesthesia Plan  ASA: 2  Anesthesia Plan: General   Post-op Pain Management: Ofirmev IV (intra-op)   Induction: Intravenous, Rapid sequence and Cricoid pressure planned  PONV Risk Score and Plan: 3 and Ondansetron and Dexamethasone  Airway Management Planned: Oral ETT  Additional Equipment: None  Intra-op Plan:   Post-operative Plan: Extubation in OR  Informed Consent:   Plan Discussed with: CRNA and Anesthesiologist  Anesthesia Plan Comments:         Anesthesia Quick Evaluation

## 2021-06-08 NOTE — Progress Notes (Signed)
Post Partum Day 0 Subjective: up ad lib, voiding, and has not passed placenta  Objective: Blood pressure 134/74, pulse 97, temperature 99.4 F (37.4 C), temperature source Oral, resp. rate 17, height 5\' 4"  (1.626 m), weight 111.9 kg, last menstrual period 01/17/2021, SpO2 98 %, unknown if currently breastfeeding.  Physical Exam:  General: alert, cooperative, and no distress Lochia: appropriate Uterine Fundus: firm Pelvic: old blood in vagina and placenta is retained   Recent Labs    06/07/21 0940  HGB 11.6*  HCT 36.2    Assessment/Plan: Retained placenta after Sab for fetal demise. Offered curettage in OR. The risks of surgery were discussed in detail with the patient including but not limited to: bleeding which may require transfusion or reoperation; infection which may require prolonged hospitalization or re-hospitalization and antibiotic therapy; injury to bowel, bladder, ureters and major vessels or other surrounding organs; formation of adhesions; need for additional procedures including laparotomy or subsequent procedures secondary to abnormal pathology; thromboembolic phenomenon; incisional problems and other postoperative or anesthesia complications.  Patient was told that the likelihood that her condition and symptoms will be treated effectively with this surgical management was very high; the postoperative expectations were also discussed in detail. The patient also understands the alternative treatment options which were discussed in full. All questions were answered.  OR aware, doxycycline ordered   LOS: 1 day   08/05/21 06/08/2021, 11:43 AM

## 2021-06-09 DIAGNOSIS — O364XX0 Maternal care for intrauterine death, not applicable or unspecified: Secondary | ICD-10-CM | POA: Diagnosis not present

## 2021-06-09 NOTE — Progress Notes (Signed)
Discharge instructions and prescriptions given to pt. Discussed post vaginal delivery care, signs and symptoms to report to the MD, upcoming appointments, and meds.Pt verbalizes understanding and has no questions or concerns at this time. Pt discharged home from hospital in stable condition. 

## 2021-06-12 LAB — SURGICAL PATHOLOGY

## 2021-06-12 NOTE — Anesthesia Postprocedure Evaluation (Signed)
Anesthesia Post Note  Patient: Samantha Stanton  Procedure(s) Performed: DILATATION AND EVACUATION     Patient location during evaluation: PACU Anesthesia Type: General Level of consciousness: awake and alert Pain management: pain level controlled Vital Signs Assessment: post-procedure vital signs reviewed and stable Respiratory status: spontaneous breathing, nonlabored ventilation, respiratory function stable and patient connected to nasal cannula oxygen Cardiovascular status: blood pressure returned to baseline and stable Postop Assessment: no apparent nausea or vomiting Anesthetic complications: no   No notable events documented.  Last Vitals:  Vitals:   06/09/21 0546 06/09/21 0811  BP: 127/73 121/73  Pulse: 89 90  Resp: 18 18  Temp: 36.9 C 37.4 C  SpO2: 100% 99%    Last Pain:  Vitals:   06/09/21 0815  TempSrc:   PainSc: 0-No pain                 Sabas Frett

## 2021-06-14 ENCOUNTER — Ambulatory Visit (INDEPENDENT_AMBULATORY_CARE_PROVIDER_SITE_OTHER): Payer: Medicaid Other | Admitting: Clinical

## 2021-06-14 DIAGNOSIS — F4321 Adjustment disorder with depressed mood: Secondary | ICD-10-CM

## 2021-06-14 NOTE — Patient Instructions (Signed)
Center for [facility] Healthcare at [facility] [address] [place], [place] [address] [phone] (main office) [phone] (Astoria Condon's office)  Hello [place], These are resources that have been helpful to women and families experiencing grief:   [place] virtual bereavement support group will be facilitated by pastoral care and perinatal education.  To start, this group will meet once a month. The registration location for this support group is on [facility] website > your wellbeing > classes and support groups > support groups  www.postpartum.net (search for "pregnancy loss support" groups)  [facility] (Individual and group grief support)   [facility]  [id number]   Also:  www.BrideEmporium.nl  www.nationalshare.org  www.missfoundation.org  www.nilmdts.org        www.stillstandingmag.com  www.rtzhope.org

## 2021-06-19 NOTE — BH Specialist Note (Signed)
Integrated Behavioral Health via Telemedicine Visit  06/19/2021 Samantha Stanton [id number]  Number of Integrated Behavioral Health visits: 4 Session Start time: 10:55  Session End time: 11:15 Total time: 20  Referring Provider: Nettie Elm, MD Patient/Family location: Home [facility] Provider location: Center for [facility] Healthcare at [facility]  All persons participating in visit: [name] and Samantha Stanton   Types of Service: Individual psychotherapy and Video visit  I connected with Samantha Stanton and/or Samantha Stanton's  n/a  via  Telephone or Video Enabled Telemedicine Application  (Video is Caregility application) and verified that I am speaking with the correct person using two identifiers. Discussed confidentiality: Yes   I discussed the limitations of telemedicine and the availability of in person appointments.  Discussed there is a possibility of technology failure and discussed alternative modes of communication if that failure occurs.  I discussed that engaging in this telemedicine visit, they consent to the provision of behavioral healthcare and the services will be billed under their insurance.  Patient and/or legal guardian expressed understanding and consented to Telemedicine visit: Yes   Presenting Concerns: Patient and/or family reports the following symptoms/concerns: Increased depression, anger, anxiety with panic attacks since pregnancy loss; pt requests medication to help cope.  Duration of problem: Increase since loss; Severity of problem:  moderately severe  Patient and/or Family's Strengths/Protective Factors: Social connections, Concrete supports in place (healthy food, safe environments, etc.), and Sense of purpose  Goals Addressed: Patient will:  Reduce symptoms of: anxiety and depression   Demonstrate ability to: Increase adequate support systems for patient/family and Increase motivation to adhere to plan of  care  Progress towards Goals: Ongoing  Interventions: Interventions utilized:  Functional Assessment of ADLs and Supportive Reflection Standardized Assessments completed: GAD-7 and PHQ 9  Patient and/or Family Response: Pt agrees with treatment plan  Assessment: Patient currently experiencing Grief.   Patient may benefit from continued therapeutic interventions.  Plan: Follow up with behavioral health clinician on : Two weeks Behavioral recommendations:  -Discuss BH  options with medical provider tomorrow, 06/29/21 -Continue plan to attend bereavement support group via [facility] -Continue to allow time and space to grieve; prioritize healthy self-care during this time Referral(s): Integrated [employer] (In Clinic)  I discussed the assessment and treatment plan with the patient and/or parent/guardian. They were provided an opportunity to ask questions and all were answered. They agreed with the plan and demonstrated an understanding of the instructions.   They were advised to call back or seek an in-person evaluation if the symptoms worsen or if the condition fails to improve as anticipated.  Samantha Stanton, Samantha Stanton  Depression screen [facility] 2/9 06/28/2021 05/11/2021 05/03/2021 04/07/2021 03/21/2021  Decreased Interest 3 1 1 3  0  Down, Depressed, Hopeless 3 1 1 3  0  PHQ - 2 Score 6 2 2 6  0  Altered sleeping 1 0 3 3 0  Tired, decreased energy 3 3 3 3  0  Change in appetite 3 0 0 1 0  Feeling bad or failure about yourself  1 0 0 2 0  Trouble concentrating 3 3 3 3  0  Moving slowly or fidgety/restless 0 0 1 2 0  Suicidal thoughts 0 0 0 0 0  PHQ-9 Score 17 8 12 20  0  Some recent data might be hidden   GAD 7 : Generalized Anxiety Score 06/28/2021 05/11/2021 05/03/2021 04/07/2021  Nervous, Anxious, on Edge 1 1 1 3   Control/stop worrying 1 1 1  3  Worry too much - different things 3 1 1 3   Trouble relaxing 3 1 0 3  Restless 1 1 0 2  Easily annoyed or irritable 3 1 1 2    Afraid - awful might happen 0 1 0 2  Total GAD 7 Score 12 7 4  18

## 2021-06-20 ENCOUNTER — Ambulatory Visit: Payer: Medicaid Other | Admitting: Obstetrics and Gynecology

## 2021-06-28 ENCOUNTER — Ambulatory Visit (INDEPENDENT_AMBULATORY_CARE_PROVIDER_SITE_OTHER): Payer: Medicaid Other | Admitting: Clinical

## 2021-06-28 ENCOUNTER — Telehealth: Payer: Self-pay

## 2021-06-28 DIAGNOSIS — F4321 Adjustment disorder with depressed mood: Secondary | ICD-10-CM | POA: Diagnosis not present

## 2021-06-28 MED ORDER — SERTRALINE HCL 50 MG PO TABS: 50.0000 mg | ORAL_TABLET | Freq: Every day | ORAL | 1 refills | Status: DC

## 2021-06-28 NOTE — Patient Instructions (Signed)
Center for [facility] at [facility] [address] [place], [place] [address] [phone] (main office) [phone] (Lavelle Berland's office)   

## 2021-06-28 NOTE — Telephone Encounter (Addendum)
-----   Message from Samantha Staggers, MD sent at 06/28/2021  1:53 PM EST ----- Please send in Rx for Zoloft 50 mg po qd  # 30 RF x 5. Please notify pt. Thanks Samantha Stanton pt and unable to leave message on either contact numbers as voicemail box is not set up the other number just keep ringing.  Samantha Stanton  06/28/21

## 2021-06-29 ENCOUNTER — Ambulatory Visit (INDEPENDENT_AMBULATORY_CARE_PROVIDER_SITE_OTHER): Payer: Medicaid Other | Admitting: Obstetrics and Gynecology

## 2021-06-29 ENCOUNTER — Encounter: Payer: Self-pay | Admitting: Obstetrics and Gynecology

## 2021-06-29 ENCOUNTER — Other Ambulatory Visit: Payer: Self-pay

## 2021-06-29 MED ORDER — DESOGESTREL-ETHINYL ESTRADIOL 0.15-30 MG-MCG PO TABS: 1.0000 | ORAL_TABLET | Freq: Every day | ORAL | 11 refills | Status: DC

## 2021-06-29 NOTE — Patient Instructions (Signed)

## 2021-06-29 NOTE — Progress Notes (Signed)
° ° °  Post Partum Visit Note  Samantha Stanton is a 26 y.o. [phone] female who presents for a postpartum visit. She is 3 weeks postpartum following a normal spontaneous vaginal delivery at 17 weeks for FDIU.   I have fully reviewed the prenatal and intrapartum course. The delivery was at 17 gestational weeks. Required suction D & C for retained placenta.  Bleeding no bleeding. Bowel function is normal. Bladder function is normal. Patient is not sexually active. Contraception method is OCP (estrogen/progesterone). Postpartum depression screening: positive.   The pregnancy intention screening data noted above was reviewed. Potential methods of contraception were discussed. The patient elected to proceed with No data recorded.    Health Maintenance Due  Topic Date Due   COVID-19 Vaccine (1) Never done   INFLUENZA VACCINE  Never done    Medical record  Review of Systems Pertinent items noted in HPI and remainder of comprehensive ROS otherwise negative.  Objective:  LMP 01/17/2021    General:  alert   Breasts:  Not indicated  Lungs: clear to auscultation bilaterally  Heart:  regular rate and rhythm, S1, S2 normal, no murmur, click, rub or gallop  Abdomen: soft, non-tender; bowel sounds normal; no masses,  no organomegaly   Wound NA  GU exam:  not indicated       Assessment:    There are no diagnoses linked to this encounter.  NL postpartum exam following IOL at 17 weeks for FDIU PP Depression  Plan:   Reviewed FDIU with pt. Advised to reframe from becoming pregnant for 4-6 weeks. Pt does not desire pregnancy at this time. Pt is seeing Asher Muir currently. Zoloft started yesterday. Has not started taking. Indications reviewed with pt. U/R/B discussed. Desires OCP's. Will start this Sunday. U/R/B and back up method reviewed. BP stable. Will have pt follow up in 4 months for reevaluation.    Nettie Elm, MD Center for [facility] Healthcare, [facility]

## 2021-06-29 NOTE — Telephone Encounter (Signed)
Seen by Alysia Penna, MD in office today.

## 2021-07-03 NOTE — BH Specialist Note (Signed)
Pt did not arrive to video visit and did not answer the phone; Unable to leave voice message; left MyChart message for patient.   

## 2021-07-12 ENCOUNTER — Ambulatory Visit: Payer: Medicaid Other | Admitting: Clinical

## 2021-07-12 DIAGNOSIS — Z91199 Patient's noncompliance with other medical treatment and regimen due to unspecified reason: Secondary | ICD-10-CM

## 2021-08-16 ENCOUNTER — Encounter: Payer: Self-pay | Admitting: Obstetrics and Gynecology

## 2021-08-17 ENCOUNTER — Other Ambulatory Visit: Payer: Self-pay | Admitting: Family Medicine

## 2021-08-17 ENCOUNTER — Other Ambulatory Visit: Payer: Self-pay

## 2021-08-17 ENCOUNTER — Ambulatory Visit (INDEPENDENT_AMBULATORY_CARE_PROVIDER_SITE_OTHER): Payer: Medicaid Other | Admitting: *Deleted

## 2021-08-17 VITALS — BP 126/76 | HR 89 | Ht 62.0 in | Wt 244.6 lb

## 2021-08-17 DIAGNOSIS — R300 Dysuria: Secondary | ICD-10-CM | POA: Diagnosis not present

## 2021-08-17 LAB — POCT URINALYSIS DIP (DEVICE)
Bilirubin Urine: NEGATIVE
Glucose, UA: NEGATIVE mg/dL
Hgb urine dipstick: NEGATIVE
Ketones, ur: NEGATIVE mg/dL
Nitrite: NEGATIVE
Protein, ur: NEGATIVE mg/dL
Specific Gravity, Urine: 1.025 (ref 1.005–1.030)
Urobilinogen, UA: 0.2 mg/dL (ref 0.0–1.0)
pH: 6.5 (ref 5.0–8.0)

## 2021-08-17 NOTE — Progress Notes (Signed)
Here for nurse visit for c/o burning when urinating, dryness, irritation.- states feels like when she had UTi before.  Will do clean catch UA . Offered to do vaginal swab. Patient declines. States she thinks she has yeast and she has had before and will self treat with Monistat. Ua negative except small leukocytes, discussed is not likely UTI but since patient states feels like UTI will send for culture. Notified if + she will be notified with treatment plan. She voices understanding. ?Elevated phq9 and Gad7. She states she sees Flanagan , [facility] here and would like to make appointment at checkout.  ?Nancy Fetter ?

## 2021-08-18 NOTE — BH Specialist Note (Signed)
Pt did not arrive to video visit and did not answer the phone; Unable to leave voicemail; left MyChart message for patient.   

## 2021-08-19 LAB — URINE CULTURE

## 2021-08-22 ENCOUNTER — Ambulatory Visit: Payer: Medicaid Other | Admitting: Clinical

## 2021-08-22 DIAGNOSIS — Z91199 Patient's noncompliance with other medical treatment and regimen due to unspecified reason: Secondary | ICD-10-CM

## 2021-10-25 ENCOUNTER — Ambulatory Visit: Payer: Medicaid Other | Admitting: Nurse Practitioner

## 2021-11-16 ENCOUNTER — Ambulatory Visit: Payer: Medicaid Other | Admitting: Physician Assistant

## 2021-11-27 ENCOUNTER — Ambulatory Visit: Payer: Medicaid Other | Admitting: Nurse Practitioner

## 2021-12-18 ENCOUNTER — Telehealth: Payer: Self-pay | Admitting: General Practice

## 2021-12-18 NOTE — Telephone Encounter (Signed)
Called patient to schedule her NP apt since we have a staffing shortage and offered patient next available appointment. Patient states she needs to be seen ASAP for her health issues. Patient is asking for something later this week if possible. Advised patient that was the next available appointment I have to offer. Patient become agitated on the phone and raises her voice and states "that doesn't work for me, I already told you that" I advised patient again that was the first available I had to offer. Their was a period of quiet time on the call. I then said to patient "is there anything else I can help you with" and patient became even more agitated and aggressive yelling at me "you aint fixin to hang up on me and yelling something I didn't understand I told the patient I was happy to schedule her an appointment, she continued to yell and told me not to talk over her. She asked my for my name, title and my managers information. I gave this information to her.  I listened to her yell for about 20 seconds more and then hung up the line.   Patient then called our office 16 times in a row from 2 different numbers.    Wynonia Sours- Phlebotomist and Mayer Masker, PA-C were privy to this conversation and repeated calls. I have contacted Haskell Riling and gave her the patient information. Per Kandis Cocking we are NOT to schedule the patient in our office due to this behavior. AS, CMA

## 2021-12-25 ENCOUNTER — Ambulatory Visit: Payer: Medicaid Other | Admitting: Nurse Practitioner

## 2022-01-08 ENCOUNTER — Ambulatory Visit: Payer: Medicaid Other | Admitting: Nurse Practitioner

## 2022-01-12 ENCOUNTER — Other Ambulatory Visit (HOSPITAL_COMMUNITY)
Admission: RE | Admit: 2022-01-12 | Discharge: 2022-01-12 | Disposition: A | Discharge status: Home / Self Care | Payer: Medicaid Other | Source: Ambulatory Visit | Attending: Obstetrics and Gynecology | Admitting: Obstetrics and Gynecology

## 2022-01-12 ENCOUNTER — Encounter: Payer: Self-pay | Admitting: Obstetrics and Gynecology

## 2022-01-12 ENCOUNTER — Ambulatory Visit (INDEPENDENT_AMBULATORY_CARE_PROVIDER_SITE_OTHER): Payer: Medicaid Other | Admitting: Obstetrics and Gynecology

## 2022-01-12 ENCOUNTER — Other Ambulatory Visit: Payer: Self-pay

## 2022-01-12 VITALS — BP 122/84 | HR 80 | Wt 239.8 lb

## 2022-01-12 DIAGNOSIS — N9089 Other specified noninflammatory disorders of vulva and perineum: Secondary | ICD-10-CM

## 2022-01-12 DIAGNOSIS — N898 Other specified noninflammatory disorders of vagina: Secondary | ICD-10-CM

## 2022-01-12 NOTE — Progress Notes (Signed)
    CC: vaginal discomfort Subjective:    Patient ID: Samantha Stanton, female    DOB: 1995/12/15, 26 y.o.   MRN: [id number]  HPI 26 yo G6P3 seen with several weeks of vague vaginal discomfort.  She notes intermittent  external vaginal itching and "bumps" which seem to arise just before her menses.  The itching and bumps seem to resolve fairly quickly.  The patient showed the MD a photo of her vagina which was highly reminiscent of an HSV lesion.   Review of Systems  Genitourinary:  Positive for genital sores and vaginal discharge.       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  Lesions were swabbed   Vitals:   01/12/22 0941  BP: 122/84  Pulse: 80         Assessment & Plan:   1. Vaginal discharge Pt concerned about change in vaginal odor, swab collected  - Cervicovaginal ancillary only( [place])  2. Vulvar lesion Discussed potential of HSV infection due to history and physical findings Culture is pending. - Herpes simplex virus culture  F/u as needed  Warden Fillers, MD Faculty Attending, Center for [facility]

## 2022-01-14 LAB — HERPES SIMPLEX VIRUS CULTURE

## 2022-01-15 ENCOUNTER — Telehealth: Payer: Self-pay | Admitting: *Deleted

## 2022-01-15 LAB — CERVICOVAGINAL ANCILLARY ONLY
Bacterial Vaginitis (gardnerella): NEGATIVE
Candida Glabrata: NEGATIVE
Candida Vaginitis: POSITIVE — AB
Chlamydia: NEGATIVE
Comment: NEGATIVE
Comment: NEGATIVE
Comment: NEGATIVE
Comment: NEGATIVE
Comment: NEGATIVE
Comment: NORMAL
Neisseria Gonorrhea: NEGATIVE
Trichomonas: NEGATIVE

## 2022-01-15 MED ORDER — FLUCONAZOLE 150 MG PO TABS: 150.0000 mg | ORAL_TABLET | Freq: Once | ORAL | 0 refills | Status: AC

## 2022-01-15 NOTE — Telephone Encounter (Signed)
Called pt and informed her of test result showing vaginal yeast.  Pt reports that she is having vaginal itching and would like treatment. Rx for Diflucan sent to pharmacy.  She was informed to contact our office if symptoms persist or worsen 3 days after treatment. She voiced understanding.

## 2022-06-05 ENCOUNTER — Inpatient Hospital Stay (HOSPITAL_COMMUNITY)
Admission: AD | Admit: 2022-06-05 | Discharge: 2022-06-06 | Disposition: A | Discharge status: Home / Self Care | Payer: Medicaid Other | Attending: Obstetrics & Gynecology | Admitting: Obstetrics & Gynecology

## 2022-06-05 ENCOUNTER — Encounter (HOSPITAL_COMMUNITY): Payer: Self-pay | Admitting: *Deleted

## 2022-06-05 DIAGNOSIS — R109 Unspecified abdominal pain: Secondary | ICD-10-CM | POA: Diagnosis not present

## 2022-06-05 DIAGNOSIS — Z3491 Encounter for supervision of normal pregnancy, unspecified, first trimester: Secondary | ICD-10-CM

## 2022-06-05 DIAGNOSIS — Z3A01 Less than 8 weeks gestation of pregnancy: Secondary | ICD-10-CM | POA: Insufficient documentation

## 2022-06-05 DIAGNOSIS — O26891 Other specified pregnancy related conditions, first trimester: Secondary | ICD-10-CM | POA: Insufficient documentation

## 2022-06-05 DIAGNOSIS — O26851 Spotting complicating pregnancy, first trimester: Secondary | ICD-10-CM | POA: Diagnosis not present

## 2022-06-05 LAB — POCT PREGNANCY, URINE: Preg Test, Ur: POSITIVE — AB

## 2022-06-05 NOTE — MAU Note (Signed)
.  Samantha Stanton is a 27 y.o. at Unknown here in MAU reporting abdominal pain since this morning that goes to her back. Spotting this am as well. Positive upt at home 2 wks ago LMP: 05/08/22 Onset of complaint: this morning Pain score: 7 Vitals:   06/05/22 2333 06/05/22 2335  BP:  (!) 144/85  Pulse: 85   Resp: 17   Temp: 98.4 F (36.9 C)   SpO2: 100%      FHT:n/a Lab orders placed from triage:  upt and u/a

## 2022-06-06 ENCOUNTER — Inpatient Hospital Stay (HOSPITAL_COMMUNITY): Payer: Medicaid Other

## 2022-06-06 DIAGNOSIS — O26891 Other specified pregnancy related conditions, first trimester: Secondary | ICD-10-CM

## 2022-06-06 DIAGNOSIS — Z3A01 Less than 8 weeks gestation of pregnancy: Secondary | ICD-10-CM

## 2022-06-06 DIAGNOSIS — R109 Unspecified abdominal pain: Secondary | ICD-10-CM

## 2022-06-06 LAB — URINALYSIS, ROUTINE W REFLEX MICROSCOPIC
Bilirubin Urine: NEGATIVE
Glucose, UA: NEGATIVE mg/dL
Hgb urine dipstick: NEGATIVE
Ketones, ur: NEGATIVE mg/dL
Leukocytes,Ua: NEGATIVE
Nitrite: NEGATIVE
Protein, ur: NEGATIVE mg/dL
Specific Gravity, Urine: 1.019 (ref 1.005–1.030)
pH: 6 (ref 5.0–8.0)

## 2022-06-06 LAB — GC/CHLAMYDIA PROBE AMP (~~LOC~~) NOT AT ARMC
Chlamydia: NEGATIVE
Comment: NEGATIVE
Comment: NORMAL
Neisseria Gonorrhea: NEGATIVE

## 2022-06-06 LAB — CBC
HCT: 38.1 % (ref 36.0–46.0)
Hemoglobin: 12.3 g/dL (ref 12.0–15.0)
MCH: 27.4 pg (ref 26.0–34.0)
MCHC: 32.3 g/dL (ref 30.0–36.0)
MCV: 84.9 fL (ref 80.0–100.0)
Platelets: 310 10*3/uL (ref 150–400)
RBC: 4.49 MIL/uL (ref 3.87–5.11)
RDW: 13.9 % (ref 11.5–15.5)
WBC: 7.8 10*3/uL (ref 4.0–10.5)
nRBC: 0 % (ref 0.0–0.2)

## 2022-06-06 LAB — WET PREP, GENITAL
Clue Cells Wet Prep HPF POC: NONE SEEN
Sperm: NONE SEEN
Trich, Wet Prep: NONE SEEN
WBC, Wet Prep HPF POC: >=10 — AB (ref <10)

## 2022-06-06 LAB — HCG, QUANTITATIVE, PREGNANCY: hCG, Beta Chain, Quant, S: 10535 m[IU]/mL — ABNORMAL HIGH (ref <5)

## 2022-06-06 IMAGING — US US OB < 14 WEEKS - US OB TV
1 series · 15 of 28 positions shown · non-contrast
Comparison: None.

CLINICAL DATA: Vaginal bleeding for 1 day, positive pregnancy test

EXAM:
OBSTETRIC <14 WK US AND TRANSVAGINAL OB US
TECHNIQUE: Both transabdominal and transvaginal ultrasound examinations were
performed for complete evaluation of the gestation as well as the
maternal uterus, adnexal regions, and pelvic cul-de-sac.
Transvaginal technique was performed to assess early pregnancy.

[Series 1: us ob < 14 weeks - us ob tv · 15 of 74 slices shown]
[im 1/74]
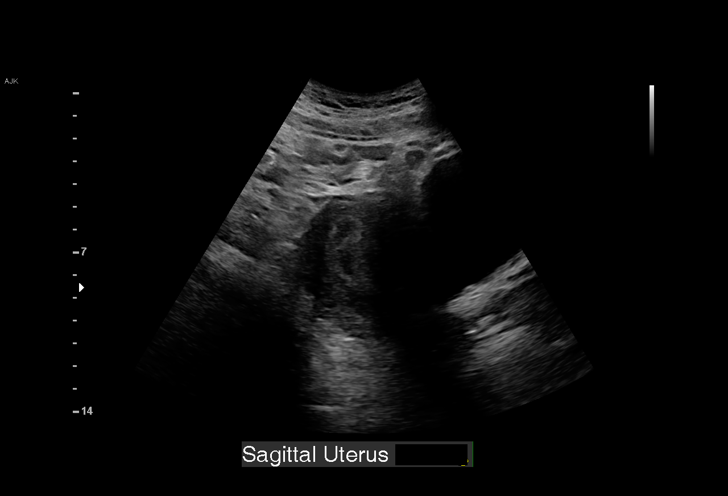
[im 6/74]
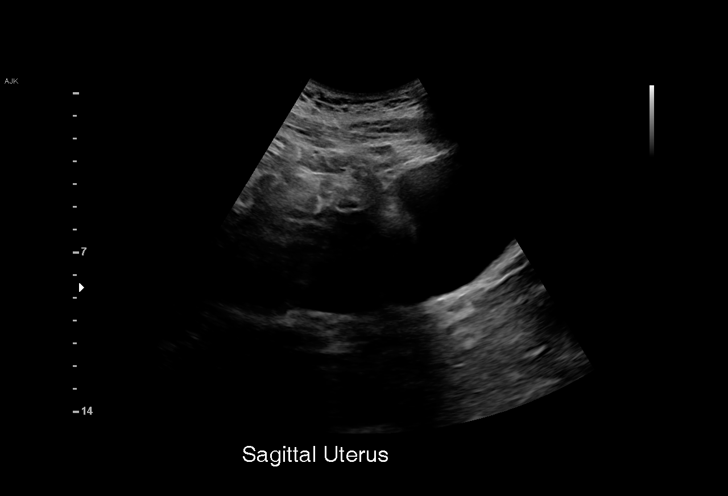
[im 11/74]
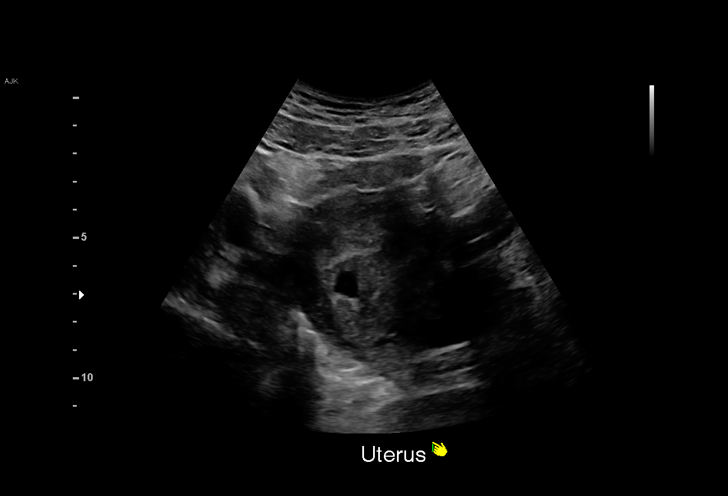
[im 17/74]
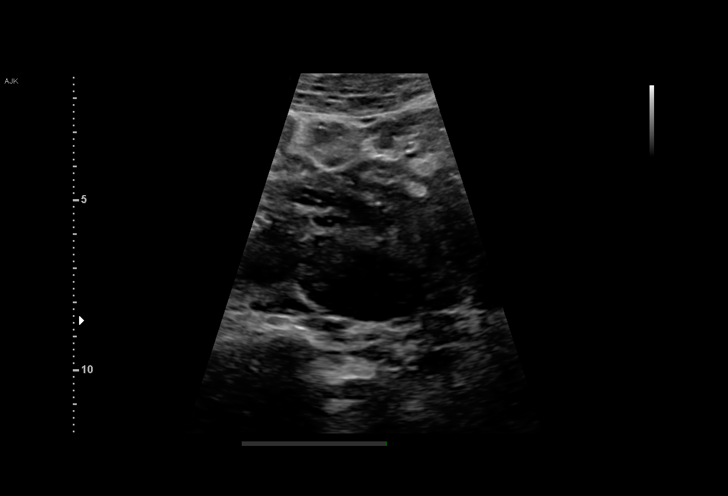
[im 22/74]
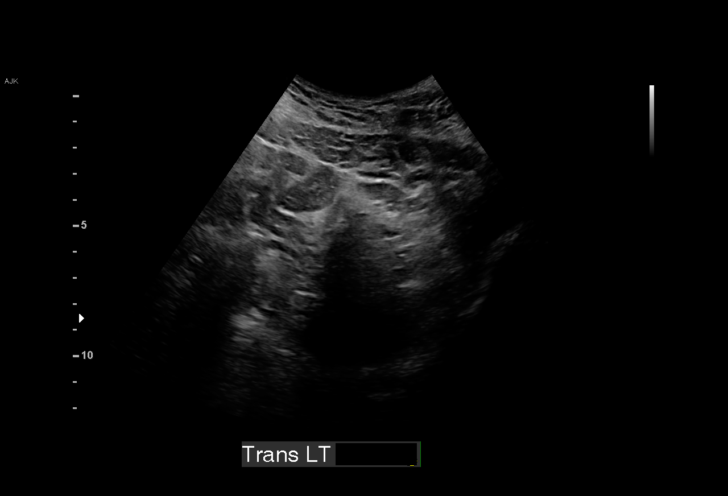
[im 28/74]
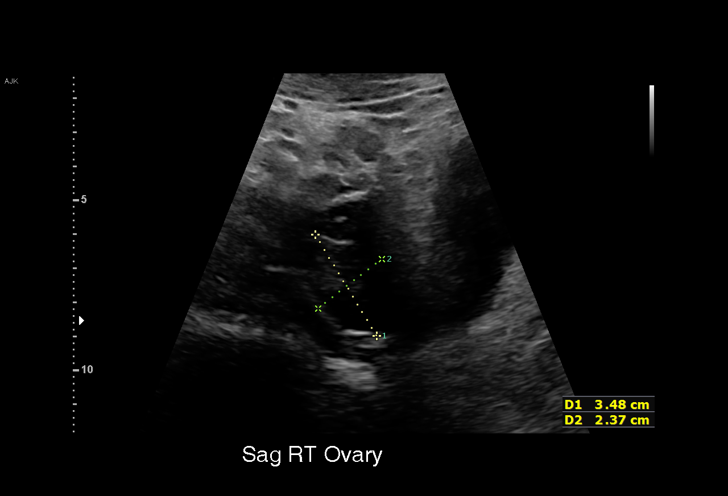
[im 33/74]
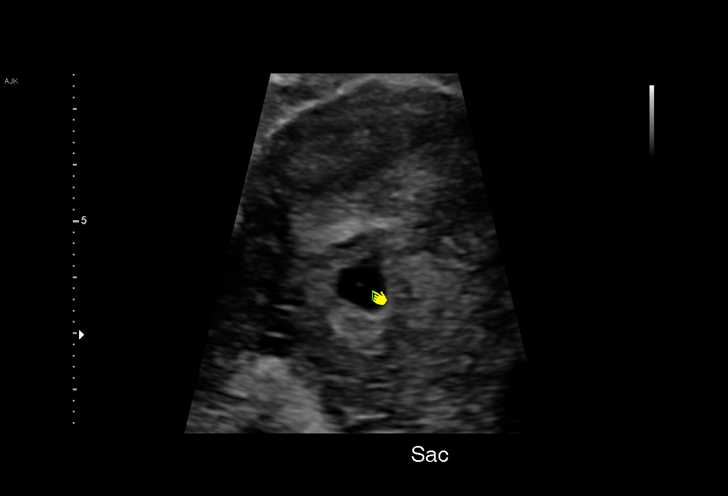
[im 38/74]
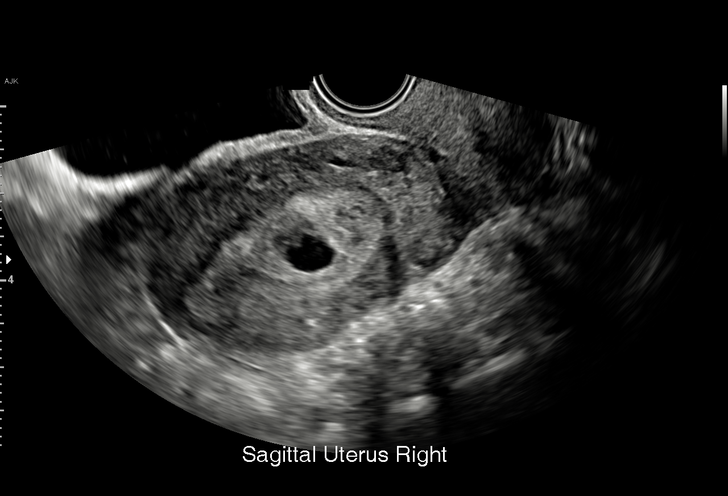
[im 41/74]
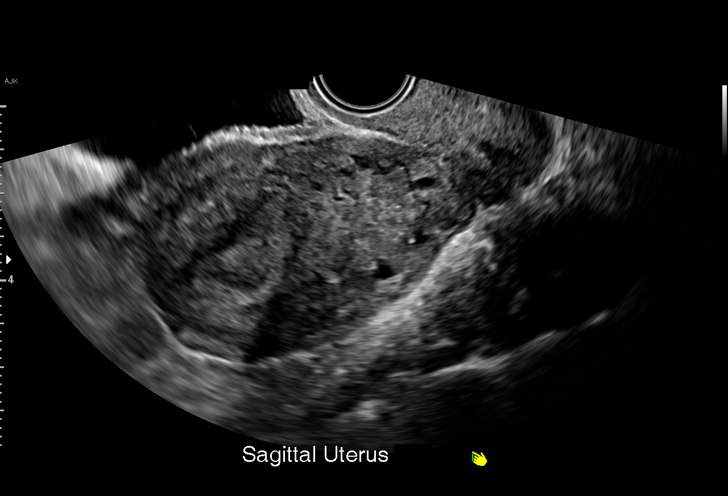
[im 46/74]
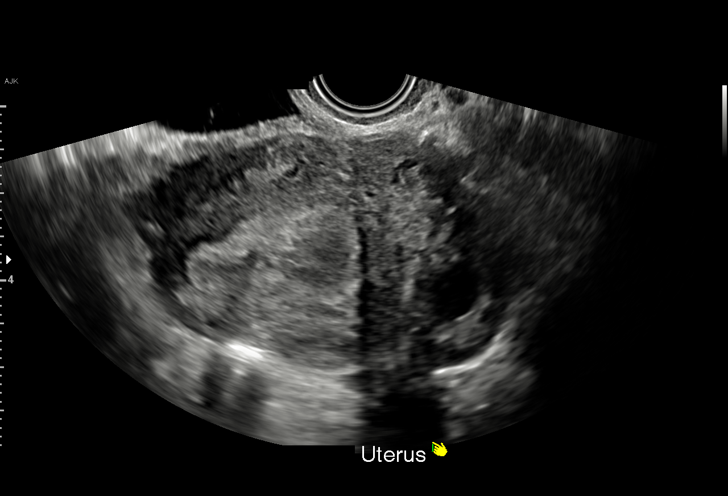
[im 52/74]
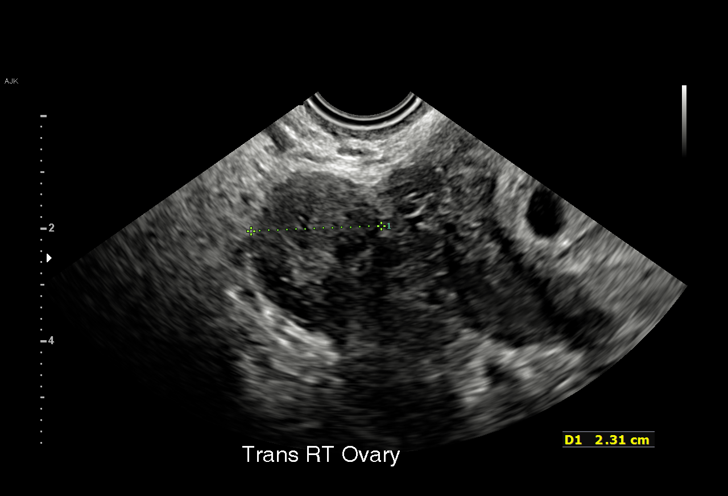
[im 57/74]
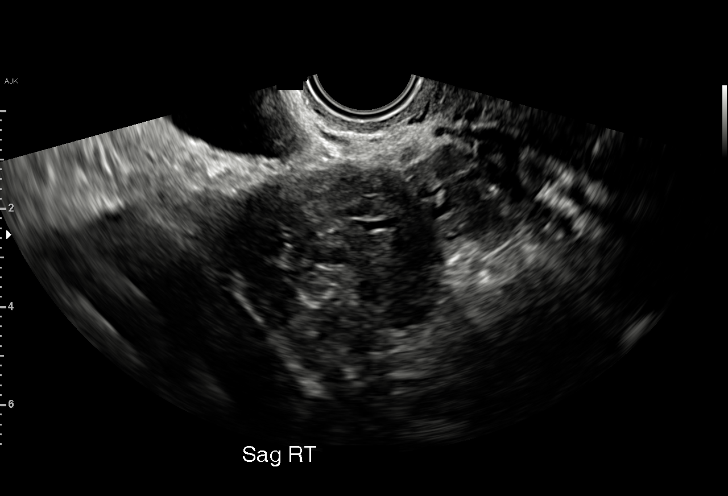
[im 63/74]
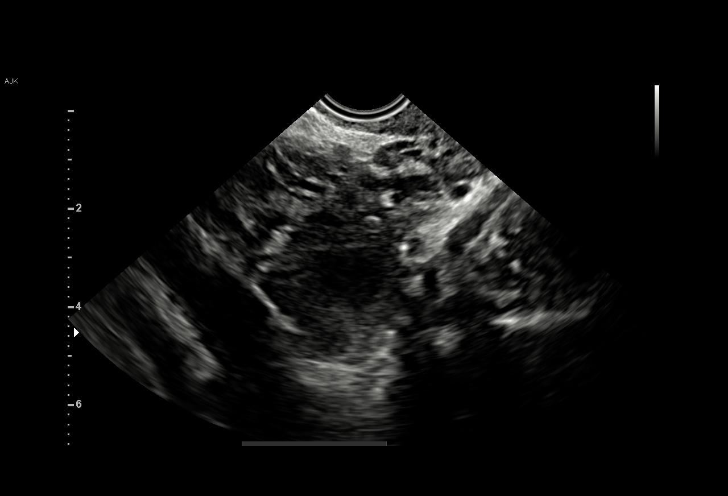
[im 68/74]
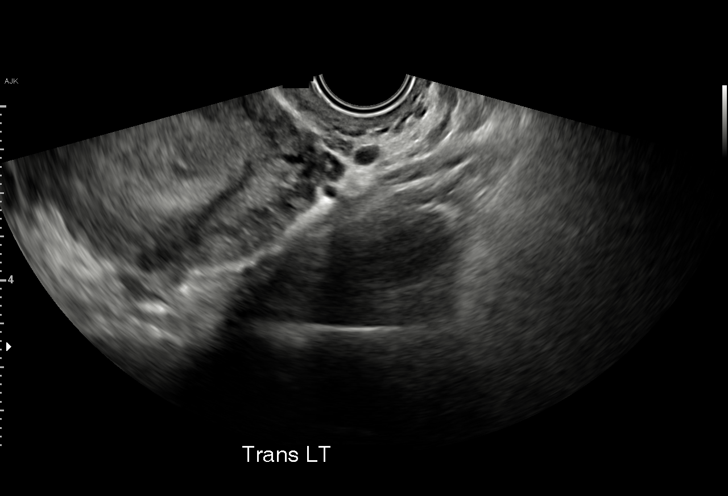
[im 74/74]
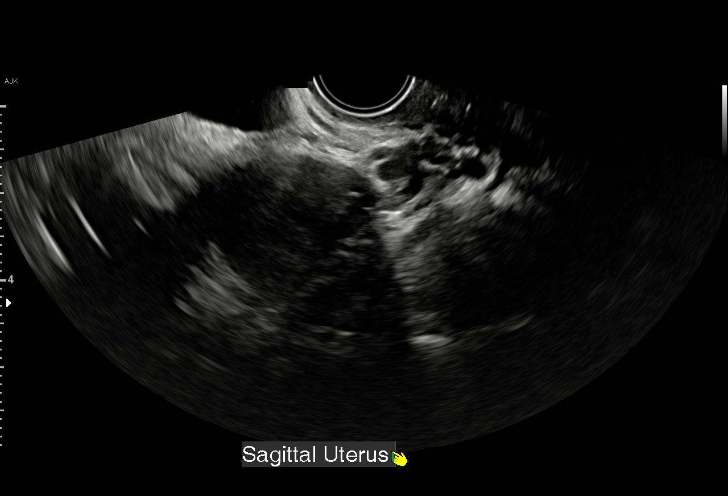

[15 of 28 positions shown; findings below may reference images not displayed]

FINDINGS: Intrauterine gestational sac: Present

Yolk sac:  Present

Embryo:  Absent

MSD: 9.7 mm   5 w   5 d

Subchorionic hemorrhage:  None visualized.

Maternal uterus/adnexae: Ovaries are within normal limits. Minimal
free pelvic fluid is noted likely physiologic in nature.
IMPRESSION: Single intrauterine gestational sac at 5 weeks 5 days. No fetal pole
is noted at this time. Correlation with serial beta HCG levels is
recommended. Repeat imaging can be performed as clinically
necessary.

## 2022-06-06 NOTE — MAU Provider Note (Signed)
History     CSN: [id number]  Arrival date and time: 06/05/22 2308   None     Chief Complaint  Patient presents with   Abdominal Pain   HPI  Samantha Stanton is a 27 y.o. [id number] at [redacted]w[redacted]d who presents for evaluation of abdominal pain. Patient reports the pain started this morning and sometimes radiates up her back. Patient rates the pain as a 7/10 and has not tried anything for the pain. She also reports some spotting this morning. She reports a positive home pregnancy test 2 weeks ago. She denies any discharge. Denies any constipation, diarrhea or any urinary complaints.   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  Consider MFM consult.   Alpha thalassemia silent carrier 05/10/2021   Anxiety    Bleeding disorder ([place])    Cannot take ASA   Chlamydia infection affecting pregnancy 2013   Hypertension    Gestational HTN   Menorrhagia 11/11/2018   Pregnancy induced hypertension    with last pregnancy    Past Surgical History:  Procedure Laterality Date   DILATION AND CURETTAGE OF UTERUS     DILATION AND EVACUATION N/A 06/08/2021   Procedure: DILATATION AND EVACUATION;  Surgeon: Woodroe Mode, MD;  Location: [facility] LD ORS;  Service: Gynecology;  Laterality: N/A;   WISDOM TOOTH EXTRACTION      Family History  Problem Relation Age of Onset   Diabetes Mother    Hypertension Mother    Cancer Sister        Leukemia    Social History   Tobacco Use   Smoking status: Never   Smokeless tobacco: Never  Vaping Use   Vaping Use: Never used  Substance Use Topics   Alcohol use: Not Currently   Drug use: No    Allergies:  Allergies  Allergen Reactions   Aspirin  Other (See Comments)    Causes nose bleeds and frequent periods - age 8. Saw hematologist and told not to take ASA    No medications prior to admission.    Review of Systems  Constitutional: Negative.  Negative for fatigue and fever.  HENT: Negative.    Respiratory: Negative.  Negative for shortness of breath.   Cardiovascular: Negative.  Negative for chest pain.  Gastrointestinal:  Positive for abdominal pain. Negative for constipation, diarrhea, nausea and vomiting.  Genitourinary:  Positive for vaginal bleeding. Negative for dysuria and vaginal discharge.  Neurological: Negative.  Negative for dizziness and headaches.   Physical Exam   Blood pressure (!) 144/85, pulse 85, temperature 98.4 F (36.9 C), resp. rate 17, height 5\' 6"  (1.676 m), weight 109.3 kg, last menstrual period 05/11/2022, SpO2 100 %.  Patient Vitals for the past 24 hrs:  BP Temp Pulse Resp SpO2 Height Weight  06/05/22 2335 (!) 144/85 -- -- -- -- -- --  06/05/22 2333 -- 98.4 F (36.9 C) 85 17 100 % 5\' 6"  (1.676 m) 109.3 kg  Physical Exam Vitals and nursing note reviewed.  Constitutional:      General: She is not in acute distress.    Appearance: She is well-developed.  HENT:     Head: Normocephalic.  Eyes:     Pupils: Pupils are equal, round, and reactive to light.  Cardiovascular:     Rate and Rhythm: Normal rate and regular rhythm.     Heart sounds: Normal heart sounds.  Pulmonary:     Effort: Pulmonary effort is normal. No respiratory distress.     Breath sounds: Normal breath sounds.  Abdominal:     General: Bowel sounds are normal. There is no distension.     Palpations: Abdomen is soft.     Tenderness: There is no abdominal tenderness.  Skin:    General: Skin is warm and dry.  Neurological:     Mental Status: She is alert and oriented to person, place, and time.  Psychiatric:        Mood and Affect: Mood normal.        Behavior: Behavior normal.        Thought Content: Thought content  normal.        Judgment: Judgment normal.      MAU Course  Procedures  Results for orders placed or performed during the hospital encounter of 06/05/22 (from the past 24 hour(s))  Pregnancy, urine POC     Status: Abnormal   Collection Time: 06/05/22 11:48 PM  Result Value Ref Range   Preg Test, Ur POSITIVE (A) NEGATIVE  Urinalysis, Routine w reflex microscopic Urine, Clean Catch     Status: None   Collection Time: 06/05/22 11:50 PM  Result Value Ref Range   Color, Urine YELLOW YELLOW   APPearance CLEAR CLEAR   Specific Gravity, Urine 1.019 1.005 - 1.030   pH 6.0 5.0 - 8.0   Glucose, UA NEGATIVE NEGATIVE mg/dL   Hgb urine dipstick NEGATIVE NEGATIVE   Bilirubin Urine NEGATIVE NEGATIVE   Ketones, ur NEGATIVE NEGATIVE mg/dL   Protein, ur NEGATIVE NEGATIVE mg/dL   Nitrite NEGATIVE NEGATIVE   Leukocytes,Ua NEGATIVE NEGATIVE  CBC     Status: None   Collection Time: 06/06/22 12:05 AM  Result Value Ref Range   WBC 7.8 4.0 - 10.5 K/uL   RBC 4.49 3.87 - 5.11 MIL/uL   Hemoglobin 12.3 12.0 - 15.0 g/dL   HCT 40.9 81.1 - 91.4 %   MCV 84.9 80.0 - 100.0 fL   MCH 27.4 26.0 - 34.0 pg   MCHC 32.3 30.0 - 36.0 g/dL   RDW 78.2 95.6 - 21.3 %   Platelets 310 150 - 400 K/uL   nRBC 0.0 0.0 - 0.2 %  hCG, quantitative, pregnancy     Status: Abnormal   Collection Time: 06/06/22 12:05 AM  Result Value Ref Range   hCG, Beta Chain, Quant, S 10,535 (H) <5 mIU/mL  Wet prep, genital     Status: Abnormal   Collection Time: 06/06/22 12:50 AM   Specimen: Vaginal  Result Value Ref Range   Yeast Wet Prep HPF POC PRESENT (A) NONE SEEN   Trich, Wet Prep NONE SEEN NONE SEEN   Clue Cells Wet Prep HPF POC NONE SEEN NONE SEEN   WBC, Wet Prep HPF POC >=10 (A) <10   Sperm NONE SEEN      US OB LESS THAN 14 WEEKS WITH OB TRANSVAGINAL  Result Date: 06/06/2022 CLINICAL DATA:  Abdominal pain and spotting EXAM: OBSTETRIC <14 WK [place] AND TRANSVAGINAL OB  US TECHNIQUE: Both transabdominal and transvaginal ultrasound  examinations were performed for complete evaluation of the gestation as well as the maternal uterus, adnexal regions, and pelvic cul-de-sac. Transvaginal technique was performed to assess early pregnancy. COMPARISON:  None Available. FINDINGS: Intrauterine gestational sac: Single Yolk sac:  Visualized. Embryo:  Not Visualized. Cardiac Activity: Not Visualized. Heart Rate: Not applicable bpm MSD: 9 mm   5 w   5 d Subchorionic hemorrhage:  None visualized. Maternal uterus/adnexae: Normal bilateral ovaries.  No free fluid. IMPRESSION: Small intrauterine gestational sac and yolk sac. Findings compatible with early intrauterine pregnancy. Electronically Signed   By: Placido Sou M.D.   On: 06/06/2022 00:55     MDM Labs ordered and reviewed.   UA, UPT CBC, HCG ABO/Rh- A POs Wet prep and gc/chlamydia US OB Comp Less 14 weeks with Transvaginal  CNM independently reviewed the imaging ordered. Imaging show gestational sac with yolk sac  CNM discussed recommendation for repeat ultrasound in 10-14 days. Scheduled [facility]23 at [facility]  Assessment and Plan   1. Normal intrauterine pregnancy on prenatal ultrasound in first trimester   2. [redacted] weeks gestation of pregnancy     -Discharge home in stable condition -First trimester precautions discussed -Patient advised to follow-up with [facility] on 1/23 for repeat ultrasound -Patient may return to MAU as needed or if her condition were to change or worsen  Wende Mott, CNM 06/06/2022, 1:16 AM

## 2022-06-06 NOTE — Discharge Instructions (Signed)

## 2022-06-06 NOTE — Progress Notes (Signed)
Written and verbal d/c instructions given by Len Blalock CNM and pt voiced understanding

## 2022-06-07 ENCOUNTER — Telehealth: Payer: Self-pay | Admitting: Family Medicine

## 2022-06-07 DIAGNOSIS — B3731 Acute candidiasis of vulva and vagina: Secondary | ICD-10-CM

## 2022-06-07 MED ORDER — TERCONAZOLE 0.4 % VA CREA: 1.0000 | TOPICAL_CREAM | Freq: Every day | VAGINAL | 0 refills | Status: DC

## 2022-06-07 NOTE — Telephone Encounter (Signed)
Called pt who reports positive yeast at MAU yesterday. No meds prescribed while in MAU. Terazol sent per protocol.

## 2022-06-07 NOTE — Telephone Encounter (Signed)
Patient requesting a RX for a Yeast Inf,  [facility] [address]

## 2022-06-12 ENCOUNTER — Other Ambulatory Visit: Payer: Self-pay

## 2022-06-12 ENCOUNTER — Encounter: Payer: Self-pay | Admitting: Obstetrics and Gynecology

## 2022-06-12 DIAGNOSIS — Z3A01 Less than 8 weeks gestation of pregnancy: Secondary | ICD-10-CM

## 2022-06-14 ENCOUNTER — Other Ambulatory Visit: Payer: Self-pay

## 2022-06-14 ENCOUNTER — Inpatient Hospital Stay (HOSPITAL_COMMUNITY)
Admission: AD | Admit: 2022-06-14 | Discharge: 2022-06-14 | Disposition: A | Discharge status: Home / Self Care | Payer: Medicaid Other | Attending: Obstetrics & Gynecology | Admitting: Obstetrics & Gynecology

## 2022-06-14 ENCOUNTER — Inpatient Hospital Stay (HOSPITAL_COMMUNITY): Payer: Medicaid Other

## 2022-06-14 DIAGNOSIS — O209 Hemorrhage in early pregnancy, unspecified: Secondary | ICD-10-CM | POA: Diagnosis not present

## 2022-06-14 DIAGNOSIS — Z6710 Type A blood, Rh positive: Secondary | ICD-10-CM | POA: Diagnosis not present

## 2022-06-14 DIAGNOSIS — O26891 Other specified pregnancy related conditions, first trimester: Secondary | ICD-10-CM | POA: Insufficient documentation

## 2022-06-14 DIAGNOSIS — O26851 Spotting complicating pregnancy, first trimester: Secondary | ICD-10-CM

## 2022-06-14 DIAGNOSIS — Z3A01 Less than 8 weeks gestation of pregnancy: Secondary | ICD-10-CM | POA: Insufficient documentation

## 2022-06-14 DIAGNOSIS — Z3490 Encounter for supervision of normal pregnancy, unspecified, unspecified trimester: Secondary | ICD-10-CM

## 2022-06-14 LAB — URINALYSIS, ROUTINE W REFLEX MICROSCOPIC
Bilirubin Urine: NEGATIVE
Glucose, UA: NEGATIVE mg/dL
Ketones, ur: NEGATIVE mg/dL
Nitrite: NEGATIVE
Protein, ur: NEGATIVE mg/dL
Specific Gravity, Urine: 1.017 (ref 1.005–1.030)
pH: 8 (ref 5.0–8.0)

## 2022-06-14 LAB — CBC
HCT: 36.8 % (ref 36.0–46.0)
Hemoglobin: 12 g/dL (ref 12.0–15.0)
MCH: 27.6 pg (ref 26.0–34.0)
MCHC: 32.6 g/dL (ref 30.0–36.0)
MCV: 84.8 fL (ref 80.0–100.0)
Platelets: 294 10*3/uL (ref 150–400)
RBC: 4.34 MIL/uL (ref 3.87–5.11)
RDW: 14.2 % (ref 11.5–15.5)
WBC: 5.8 10*3/uL (ref 4.0–10.5)
nRBC: 0 % (ref 0.0–0.2)

## 2022-06-14 NOTE — MAU Note (Signed)
Samantha Stanton is a 27 y.o. at [redacted]w[redacted]d here in MAU reporting: spotting with wiping since Monday.  Reports cramping in lower back.  Denies recent intercourse. LMP: NA Onset of complaint: Monday Pain score: 3 Vitals:   06/14/22 1159  BP: 127/76  Pulse: 84  Resp: 18  Temp: 99.3 F (37.4 C)  SpO2: 99%     FHT:NA Lab orders placed from triage:  UA

## 2022-06-14 NOTE — MAU Provider Note (Signed)
History     CSN: [id number]  Arrival date and time: 06/14/22 1130   Event Date/Time   First Provider Initiated Contact with Patient 06/14/22 1429      Chief Complaint  Patient presents with   Vaginal Bleeding   HPI Samantha Stanton is a 27 y.o. [id number] at [redacted]w[redacted]d who presents to MAU with chief complaint of vaginal spotting. This is a new problem, onset Monday 06/11/22. She is not saturating pads. She is not experiencing weakness, dizziness or activity intolerance. Spotting is brown-tinged and accompanied by low back "cramping". Pain score is 3/10. She declines pain medication in MAU. Patient thinks both symptoms may be associated with her workouts.   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  Consider MFM consult.   Alpha thalassemia silent carrier 05/10/2021   Anxiety    Bleeding disorder ([place])    Cannot take ASA   Chlamydia infection affecting pregnancy 2013   Hypertension    Gestational HTN   Menorrhagia 11/11/2018   Pregnancy induced hypertension    with last pregnancy    Past Surgical History:  Procedure Laterality Date   DILATION AND CURETTAGE OF UTERUS     DILATION AND EVACUATION N/A 06/08/2021   Procedure: DILATATION AND EVACUATION;  Surgeon: Woodroe Mode, MD;  Location: [facility];  Service: Gynecology;  Laterality: N/A;   WISDOM TOOTH EXTRACTION      Family History  Problem Relation Age of Onset   Diabetes Mother    Hypertension Mother    Cancer Sister        Leukemia    Social History   Tobacco Use   Smoking status: Never   Smokeless tobacco: Never  Vaping Use   Vaping Use: Never used  Substance Use Topics   Alcohol use: Not  Currently   Drug use: No    Allergies:  Allergies  Allergen Reactions   Aspirin Other (See Comments)    Causes nose bleeds and frequent periods - age 40. Saw hematologist and told not to take ASA    Medications Prior to Admission  Medication Sig Dispense Refill Last Dose   FLUoxetine (PROZAC) 20 MG capsule Take 20 mg by mouth daily.      methylphenidate 27 MG PO CR tablet Take 27 mg by mouth every morning.      Multiple Vitamins-Minerals (HAIR SKIN AND NAILS FORMULA PO) Take 2 tablets by mouth daily.      Prenatal Vit-Fe Fumarate-FA (MULTIVITAMIN-PRENATAL) 27-0.8 MG TABS tablet Take 1 tablet by mouth daily at 12 noon.      Probiotic Product (PROBIOTIC DAILY PO) Take 2 tablets by mouth daily.      terconazole (TERAZOL 7) 0.4 % vaginal cream Place 1 applicator vaginally at bedtime. 45 g 0     Review of Systems  Genitourinary:  Positive for vaginal bleeding.  Musculoskeletal:  Positive for back pain.  All other systems reviewed and are negative.  Physical  Exam   Blood pressure 127/76, pulse 84, temperature 99.3 F (37.4 C), temperature source Oral, resp. rate 18, height 5\' 6"  (1.676 m), weight 110.7 kg, last menstrual period 05/11/2022, SpO2 99 %.  Physical Exam Vitals and nursing note reviewed. Exam conducted with a chaperone present.  Constitutional:      Appearance: She is not ill-appearing.  Cardiovascular:     Rate and Rhythm: Normal rate.  Pulmonary:     Effort: Pulmonary effort is normal.  Abdominal:     General: Abdomen is flat.  Skin:    Capillary Refill: Capillary refill takes less than 2 seconds.  Neurological:     Mental Status: She is alert and oriented to person, place, and time.  Psychiatric:        Mood and Affect: Mood normal.        Behavior: Behavior normal.        Thought Content: Thought content normal.        Judgment: Judgment normal.     MAU Course  Procedures  MDM  --EMr reviewed. Patient is s/p MAU evaluation for abdominal pain on  06/06/2022. GS and YS seen at that time  Orders Placed This Encounter  Procedures   US OB LESS THAN 14 WEEKS WITH OB TRANSVAGINAL   Urinalysis, Routine w reflex microscopic Urine, Clean Catch   CBC   Discharge patient   Patient Vitals for the past 24 hrs:  BP Temp Temp src Pulse Resp SpO2 Height Weight  06/14/22 1159 127/76 99.3 F (37.4 C) Oral 84 18 99 % -- --  06/14/22 1157 -- -- -- -- -- -- 5\' 6"  (1.676 m) 110.7 kg   Results for orders placed or performed during the hospital encounter of 06/14/22 (from the past 24 hour(s))  Urinalysis, Routine w reflex microscopic Urine, Clean Catch     Status: Abnormal   Collection Time: 06/14/22 11:46 AM  Result Value Ref Range   Color, Urine YELLOW YELLOW   APPearance HAZY (A) CLEAR   Specific Gravity, Urine 1.017 1.005 - 1.030   pH 8.0 5.0 - 8.0   Glucose, UA NEGATIVE NEGATIVE mg/dL   Hgb urine dipstick MODERATE (A) NEGATIVE   Bilirubin Urine NEGATIVE NEGATIVE   Ketones, ur NEGATIVE NEGATIVE mg/dL   Protein, ur NEGATIVE NEGATIVE mg/dL   Nitrite NEGATIVE NEGATIVE   Leukocytes,Ua TRACE (A) NEGATIVE   RBC / HPF 21-50 0 - 5 RBC/hpf   WBC, UA 11-20 0 - 5 WBC/hpf   Bacteria, UA RARE (A) NONE SEEN   Squamous Epithelial / HPF 0-5 0 - 5 /HPF   Mucus PRESENT   CBC     Status: None   Collection Time: 06/14/22 12:12 PM  Result Value Ref Range   WBC 5.8 4.0 - 10.5 K/uL   RBC 4.34 3.87 - 5.11 MIL/uL   Hemoglobin 12.0 12.0 - 15.0 g/dL   HCT 36.8 36.0 - 46.0 %   MCV 84.8 80.0 - 100.0 fL   MCH 27.6 26.0 - 34.0 pg   MCHC 32.6 30.0 - 36.0 g/dL   RDW 14.2 11.5 - 15.5 %   Platelets 294 150 - 400 K/uL   nRBC 0.0 0.0 - 0.2 %   US OB LESS THAN 14 WEEKS WITH OB TRANSVAGINAL  Result Date: 06/14/2022 CLINICAL DATA:  Vaginal spotting, LMP 05/11/2022 EXAM: OBSTETRIC <14 WK [place] AND TRANSVAGINAL OB US TECHNIQUE: Both transabdominal and transvaginal ultrasound examinations were performed for complete evaluation of the gestation as well as the maternal  uterus, adnexal regions, and pelvic cul-de-sac. Transvaginal technique was performed to assess early pregnancy. COMPARISON:  None available FINDINGS: Intrauterine gestational sac: Present, single Yolk sac:  Present Embryo:  Not identified Cardiac Activity: N/A Heart Rate: N/A  bpm MSD: 13.2 mm   6 w   1 d Subchorionic hemorrhage:  None visualized. Maternal uterus/adnexae: Maternal uterus otherwise unremarkable. RIGHT ovary normal size and morphology 4.1 x 3.1 x 2.7 cm. LEFT ovary normal size and morphology, 3.6 x 2.4 x 2.3 cm. Trace nonspecific free fluid in endocervical canal. No free pelvic fluid or adnexal masses. IMPRESSION: Intrauterine gestational sac seen with mean sac diameter corresponding to 6 weeks 1 day EGA. No fetal pole identified to establish viability; may consider follow-up ultrasound in 14 days to establish viability if clinically indicated. No acute abnormalities. Electronically Signed   By: Ulyses Southward M.D.   On: 06/14/2022 14:21     Assessment and Plan  --27 y.o. [id number] with confirmed GS and YS on formal ultrasound --Hgb 12.0 --Blood type A POS --Discharge home in stable condition with first trimester precautions  F/U: --Patient currently scheduled for viability scan next week. Discussed with her that this needs to be moved (to be performed 10-14 days from today). High alert message sent to [facility] to please help coordinate.  Calvert Cantor, MSA, MSN, CNM 06/14/2022, 3:04 PM

## 2022-06-14 NOTE — Discharge Instructions (Signed)

## 2022-06-15 ENCOUNTER — Telehealth: Payer: Self-pay | Admitting: Lactation Services

## 2022-06-15 NOTE — Telephone Encounter (Signed)
Called patient to reschedule her Viabillity [place]. Rescheduled for earliest availability.   Patient reports continued spotting with some clots. She reports the amount is about the same as before. She does not endorse increased pain.   Reviewed with patient that if bleeding increases to bleeding like a period or pain increases, to return to MAU for further evaluation. Patient voiced understanding.

## 2022-06-15 NOTE — Telephone Encounter (Signed)
-----  Message from Darlina Rumpf, [place] sent at 06/14/2022  2:35 PM EST ----- Regarding: Rechedule Viability [place] I need to reschedule this patient's viability scan. I just don't know who the right person is to call. Thanks for your help. SW

## 2022-06-16 ENCOUNTER — Inpatient Hospital Stay (HOSPITAL_COMMUNITY)
Admission: AD | Admit: 2022-06-16 | Discharge: 2022-06-16 | Disposition: A | Discharge status: Home / Self Care | Payer: Medicaid Other | Attending: Obstetrics and Gynecology | Admitting: Obstetrics and Gynecology

## 2022-06-16 ENCOUNTER — Inpatient Hospital Stay (HOSPITAL_COMMUNITY): Payer: Medicaid Other

## 2022-06-16 DIAGNOSIS — O161 Unspecified maternal hypertension, first trimester: Secondary | ICD-10-CM | POA: Insufficient documentation

## 2022-06-16 DIAGNOSIS — Z3A01 Less than 8 weeks gestation of pregnancy: Secondary | ICD-10-CM | POA: Diagnosis not present

## 2022-06-16 DIAGNOSIS — O200 Threatened abortion: Secondary | ICD-10-CM | POA: Insufficient documentation

## 2022-06-16 LAB — HCG, QUANTITATIVE, PREGNANCY: hCG, Beta Chain, Quant, S: 13659 m[IU]/mL — ABNORMAL HIGH (ref <5)

## 2022-06-16 NOTE — MAU Note (Addendum)
Pt says she was here on Thurs- for VB-  Had U/S  and labs  and UA Says VB heavier yesterday - and now passing and cramping . Pain- 7/10 In Triage - pad and toilet paper small streak dark red blood

## 2022-06-16 NOTE — MAU Provider Note (Signed)
History     CSN: [id number]  Arrival date and time: 06/16/22 8676   Event Date/Time   First Provider Initiated Contact with Patient 06/16/22 [phone]      Chief Complaint  Patient presents with   Abdominal Pain   Vaginal Bleeding   Samantha Stanton is a 27 y.o. [id number] at [redacted]w[redacted]d who receives care at [facility].  She presents today for Abdominal Pain and Vaginal Bleeding. Patient states she has been having vaginal bleeding, but yesterday it got heavier and she started having dimes sized clots. She also states she started having cramping after the bleeding increased.  She rates the pain a 5/10 and reports it is intermittent. She denies issues with urination and reports no recent sexual activity.    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  Consider MFM consult.   Alpha thalassemia silent carrier 05/10/2021   Anxiety    Bleeding disorder ([place])    Cannot take ASA   Chlamydia infection affecting pregnancy 2013   Hypertension    Gestational HTN   Menorrhagia 11/11/2018   Pregnancy induced hypertension    with last pregnancy    Past Surgical History:  Procedure Laterality Date   DILATION AND CURETTAGE OF UTERUS     DILATION AND EVACUATION N/A 06/08/2021   Procedure: DILATATION AND EVACUATION;  Surgeon: Woodroe Mode, MD;  Location: [facility] LD [facility]S;  Service: Gynecology;  Laterality: N/A;   WISDOM TOOTH EXTRACTION      Family History  Problem Relation Age of Onset   Diabetes Mother    Hypertension Mother    Cancer Sister        Leukemia    Social History   Tobacco Use   Smoking status: Never   Smokeless tobacco: Never  Vaping Use   Vaping Use: Never used   Substance Use Topics   Alcohol use: Not Currently   Drug use: No    Allergies:  Allergies  Allergen Reactions   Aspirin Other (See Comments)    Causes nose bleeds and frequent periods - age 52. Saw hematologist and told not to take ASA    Medications Prior to Admission  Medication Sig Dispense Refill Last Dose   FLUoxetine (PROZAC) 20 MG capsule Take 20 mg by mouth daily.      methylphenidate 27 MG PO CR tablet Take 27 mg by mouth every morning.      Multiple Vitamins-Minerals (HAIR SKIN AND NAILS FORMULA PO) Take 2 tablets by mouth daily.      Prenatal Vit-Fe Fumarate-FA (MULTIVITAMIN-PRENATAL) 27-0.8 MG TABS tablet Take 1 tablet by mouth daily at 12 noon.      Probiotic Product (PROBIOTIC DAILY PO) Take 2 tablets by mouth daily.      terconazole (TERAZOL 7) 0.4 % vaginal cream Place 1 applicator vaginally at bedtime. 45 g 0     Review of Systems  Gastrointestinal:  Positive for abdominal pain.  Genitourinary:  Negative for  difficulty urinating and dysuria.   Physical Exam   Blood pressure 127/84, pulse 73, temperature 99 F (37.2 C), temperature source Oral, resp. rate 18, height 5\' 5"  (1.651 m), weight 111.1 kg, last menstrual period 05/11/2022.  Physical Exam  MAU Course  Procedures Results for orders placed or performed during the hospital encounter of 06/16/22 (from the past 24 hour(s))  hCG, quantitative, pregnancy     Status: Abnormal   Collection Time: 06/16/22  6:04 AM  Result Value Ref Range   hCG, Beta Chain, Quant, S 13,659 (H) <5 mIU/mL   US OB Transvaginal  Result Date: 06/16/2022 CLINICAL DATA:  Vaginal bleeding during pregnancy EXAM: TRANSVAGINAL OB ULTRASOUND TECHNIQUE: Transvaginal ultrasound was performed for complete evaluation of the gestation as well as the maternal uterus, adnexal regions, and pelvic cul-de-sac. COMPARISON:  Two days ago FINDINGS: Intrauterine gestational sac: Single Yolk sac:  Visualized.  8 mm Embryo:  Not seen MSD: 12.6 mm   6 w    1 d Subchorionic hemorrhage:  None visualized. Maternal uterus/adnexae: Unremarkable Trace pelvic fluid which is simple. IMPRESSION: 1. Single intrauterine gestational sac and yolk sac. No change in mean sac diameter compared to 2 days ago and no embryo seen over a 10 span, recommend close follow-up for viability. 2. No hematoma. Electronically Signed   By: Jorje Guild M.D.   On: 06/16/2022 06:48    MDM Ultrasound Labs: hCG Assessment and Plan  27 year old [id number] at 7.2 weeks Vaginal Bleeding Miscarriage; Threatened vs Inevitable  -Orders placed in triage. -Results return as noted above. -Provider to bedside discuss findings. -Informed that likely having miscarriage as hCG with some rise, but not significant.   -Also informed that no change in [place] from 2 days ago, but also minimal change from [place] 10 days ago which is of concern. -Patient tearful and allowed to express concerns including her personal thoughts that she is likely miscarrying, but desire for pregnancy even though it was not expected/planned.  -Reviewed miscarriage and options as well as option to repeat US and/or labs for further reassurance. -Patient expresses desire for repeat US and is scheduled for Feb 6th.   -Cautioned that she may not make it to this date and if any increased bleeding or pain occurs to return to MAU.  -Patient expresses gratitude and is agreeable to plan. -Bleeding precautions reviewed.  Further informed that if miscarriage occurs at home and she does not come to MAU to call office to schedule for follow up. -Also discussed pain management at home including usage of tylenol and warm compresses.  -Patient without further questions or concerns.  -Discharged to home in stable condition.  Maryann Conners 06/16/2022, 7:24 AM

## 2022-06-19 ENCOUNTER — Other Ambulatory Visit: Payer: Medicaid Other

## 2022-07-03 ENCOUNTER — Other Ambulatory Visit: Payer: Medicaid Other

## 2022-09-07 ENCOUNTER — Telehealth: Payer: Medicaid Other | Admitting: Emergency Medicine

## 2022-09-07 DIAGNOSIS — R102 Pelvic and perineal pain: Secondary | ICD-10-CM

## 2022-09-07 NOTE — Progress Notes (Signed)
Because you have concerns for a boil, you will need to be seen in person so that you can have incision and drainage if needed.  I feel your condition warrants further evaluation and I recommend that you be seen in a face to face visit.   NOTE: There will be NO CHARGE for this eVisit   If you are having a true medical emergency please call 911.      For an urgent face to face visit, [facility] has eight urgent care centers for your convenience:   NEW!! [facility] Health Urgent Care Center at [facility] Get Driving Directions [phone] [address], [address] [place], [address]    [facility] Health Urgent Care Center at [facility] Get Driving Directions [phone] [address] [place], [place] [address]   [facility] Health Urgent Care Center [facility]) Get Driving Directions [phone] [address] [place], [place] [address]  [facility] Health Urgent Care Center [facility]) Get Driving Directions [phone] [address] [place],  [place]  [address]  [facility] Health Urgent Care Center [facility] - at [employer]  [phone] [address] [place],  [place] [address]   [facility] Health Urgent Care at [facility] Get Driving Directions [phone] [address] [place], [place] [address]   [facility] Health Urgent Care at [facility] Get Driving Directions  [phone] [address] [place], [place] [address]   [facility] Health Urgent Care at [facility] Directions [phone] [address], [address] [place], [place] [address]  Your MyChart E-visit questionnaire answers were reviewed by a board certified advanced clinical practitioner to complete your personal care plan based on your specific symptoms.  Thank you for using e-Visits.   Approximately 5 minutes was used in reviewing the patient's chart, questionnaire, prescribing medications, and  documentation.

## 2022-09-10 ENCOUNTER — Encounter: Payer: Self-pay | Admitting: Family Medicine

## 2022-09-10 ENCOUNTER — Ambulatory Visit (INDEPENDENT_AMBULATORY_CARE_PROVIDER_SITE_OTHER): Payer: Medicaid Other | Admitting: Family Medicine

## 2022-09-10 VITALS — BP 114/79 | HR 83 | Temp 98.1°F | Resp 16 | Ht 65.0 in | Wt 237.8 lb

## 2022-09-10 DIAGNOSIS — F902 Attention-deficit hyperactivity disorder, combined type: Secondary | ICD-10-CM

## 2022-09-10 DIAGNOSIS — L700 Acne vulgaris: Secondary | ICD-10-CM

## 2022-09-10 DIAGNOSIS — F32A Depression, unspecified: Secondary | ICD-10-CM

## 2022-09-10 DIAGNOSIS — F419 Anxiety disorder, unspecified: Secondary | ICD-10-CM | POA: Diagnosis not present

## 2022-09-10 DIAGNOSIS — Z7689 Persons encountering health services in other specified circumstances: Secondary | ICD-10-CM

## 2022-09-10 NOTE — Progress Notes (Signed)
Patient is here to established care with provider today. Patient has many health concern they would like to discuss with provider today  Care gaps discuss at appointment today  

## 2022-09-12 ENCOUNTER — Encounter: Payer: Self-pay | Admitting: Family Medicine

## 2022-09-12 NOTE — Progress Notes (Signed)
New Patient Office Visit  Subjective    Patient ID: Samantha Stanton, female    DOB: [birth year]  Age: 27 y.o. MRN: [id number]  CC: No chief complaint on file.   HPI Samantha Stanton presents to establish care and for review of chronic med issues. Patient denies acute complaints.   Outpatient Encounter Medications as of 09/10/2022  Medication Sig   FLUoxetine (PROZAC) 20 MG capsule Take 20 mg by mouth daily.   Multiple Vitamins-Minerals (HAIR SKIN AND NAILS FORMULA PO) Take 2 tablets by mouth daily.   Prenatal Vit-Fe Fumarate-FA (MULTIVITAMIN-PRENATAL) 27-0.8 MG TABS tablet Take 1 tablet by mouth daily at 12 noon.   Probiotic Product (PROBIOTIC DAILY PO) Take 2 tablets by mouth daily.   RETIN-A 0.025 % cream Apply topically at bedtime as needed.   spironolactone (ALDACTONE) 50 MG tablet Take 50 mg by mouth daily.   No facility-administered encounter medications on file as of 09/10/2022.    Past Medical History:  Diagnosis Date   Abnormal platelet aggregation 12/24/2012   Hx of menorrhagia; at NOB intake, mother states platelet aggregation corrected and not treated.  Consider MFM consult.   Alpha thalassemia silent carrier 05/10/2021   Anxiety    Bleeding disorder    Cannot take ASA   Chlamydia infection affecting pregnancy 2013   Hypertension    Gestational HTN   Menorrhagia 11/11/2018   Pregnancy induced hypertension    with last pregnancy    Past Surgical History:  Procedure Laterality Date   DILATION AND CURETTAGE OF UTERUS     DILATION AND EVACUATION N/A 06/08/2021   Procedure: DILATATION AND EVACUATION;  Surgeon: Adam Phenix, MD;  Location: [facility] LD ORS;  Service: Gynecology;  Laterality: N/A;   WISDOM TOOTH EXTRACTION      Family History  Problem Relation Age of Onset   Diabetes Mother    Hypertension Mother    Cancer Sister        Leukemia    Social History   Socioeconomic History   Marital status: Single    Spouse name: Not on file   Number of children:  Not on file   Years of education: Not on file   Highest education level: Not on file  Occupational History   Occupation: Member Advocate    Comment: [employer]  Tobacco Use   Smoking status: Never   Smokeless tobacco: Never  Vaping Use   Vaping Use: Never used  Substance and Sexual Activity   Alcohol use: Not Currently   Drug use: No   Sexual activity: Yes    Birth control/protection: None  Other Topics Concern   Not on file  Social History Narrative   Not on file   Social Determinants of Health   Financial Resource Strain: Not on file  Food Insecurity: No Food Insecurity (01/12/2022)   Hunger Vital Sign    Worried About Running Out of Food in the Last Year: Never true    Ran Out of Food in the Last Year: Never true  Transportation Needs: No Transportation Needs (01/12/2022)   PRAPARE - Administrator, Civil Service (Medical): No    Lack of Transportation (Non-Medical): No  Physical Activity: Not on file  Stress: Not on file  Social Connections: Not on file  Intimate Partner Violence: Not At Risk (06/15/2019)   Humiliation, Afraid, Rape, and Kick questionnaire    Fear of Current or Ex-Partner: No    Emotionally Abused: No    Physically Abused: No  Sexually Abused: No    Review of Systems  All other systems reviewed and are negative.       Objective    BP 114/79   Pulse 83   Temp 98.1 F (36.7 C) (Oral)   Resp 16   Ht  (1.651 m)   Wt 237 lb 12.8 oz (107.9 kg)   LMP 05/11/2022   SpO2 98%   Breastfeeding Unknown   BMI 39.57 kg/m   Physical Exam Vitals and nursing note reviewed.  Constitutional:      General: She is not in acute distress.    Appearance: She is obese.  Cardiovascular:     Rate and Rhythm: Normal rate and regular rhythm.  Pulmonary:     Effort: Pulmonary effort is normal.     Breath sounds: Normal breath sounds.  Abdominal:     Palpations: Abdomen is soft.     Tenderness: There is no abdominal tenderness.   Neurological:     General: No focal deficit present.     Mental Status: She is alert and oriented to person, place, and time.         Assessment & Plan:   1. Acne vulgaris Management per consultant. On Retin A and spironolactone  2. Anxiety and depression Appears stable. Continue present management. Management as per consultant.   3. Attention deficit hyperactivity disorder (ADHD), combined type Currently not on meds.   4. Encounter to establish care     Return if symptoms worsen or fail to improve.   Tommie Raymond, MD

## 2022-09-18 ENCOUNTER — Ambulatory Visit: Payer: Medicaid Other | Admitting: Advanced Practice Midwife

## 2022-09-21 ENCOUNTER — Ambulatory Visit: Payer: Medicaid Other | Admitting: Family Medicine

## 2022-10-11 ENCOUNTER — Ambulatory Visit: Payer: Medicaid Other

## 2023-05-27 ENCOUNTER — Other Ambulatory Visit: Payer: Self-pay

## 2023-05-27 ENCOUNTER — Inpatient Hospital Stay (HOSPITAL_BASED_OUTPATIENT_CLINIC_OR_DEPARTMENT_OTHER): Payer: Self-pay

## 2023-05-27 ENCOUNTER — Inpatient Hospital Stay (HOSPITAL_COMMUNITY)
Admission: AD | Admit: 2023-05-27 | Discharge: 2023-05-27 | Disposition: A | Discharge status: Home / Self Care | Payer: Self-pay | Attending: Obstetrics and Gynecology | Admitting: Obstetrics and Gynecology

## 2023-05-27 ENCOUNTER — Encounter (HOSPITAL_COMMUNITY): Payer: Self-pay | Admitting: *Deleted

## 2023-05-27 DIAGNOSIS — O163 Unspecified maternal hypertension, third trimester: Secondary | ICD-10-CM | POA: Diagnosis not present

## 2023-05-27 DIAGNOSIS — O321XX0 Maternal care for breech presentation, not applicable or unspecified: Secondary | ICD-10-CM | POA: Insufficient documentation

## 2023-05-27 DIAGNOSIS — Z3A35 35 weeks gestation of pregnancy: Secondary | ICD-10-CM

## 2023-05-27 DIAGNOSIS — O133 Gestational [pregnancy-induced] hypertension without significant proteinuria, third trimester: Secondary | ICD-10-CM | POA: Diagnosis not present

## 2023-05-27 DIAGNOSIS — E669 Obesity, unspecified: Secondary | ICD-10-CM

## 2023-05-27 DIAGNOSIS — O1493 Unspecified pre-eclampsia, third trimester: Secondary | ICD-10-CM | POA: Diagnosis not present

## 2023-05-27 DIAGNOSIS — O368130 Decreased fetal movements, third trimester, not applicable or unspecified: Secondary | ICD-10-CM

## 2023-05-27 DIAGNOSIS — O99213 Obesity complicating pregnancy, third trimester: Secondary | ICD-10-CM | POA: Diagnosis not present

## 2023-05-27 DIAGNOSIS — O26893 Other specified pregnancy related conditions, third trimester: Secondary | ICD-10-CM | POA: Diagnosis not present

## 2023-05-27 DIAGNOSIS — O09293 Supervision of pregnancy with other poor reproductive or obstetric history, third trimester: Secondary | ICD-10-CM | POA: Diagnosis not present

## 2023-05-27 LAB — COMPREHENSIVE METABOLIC PANEL
ALT: 10 U/L (ref 0–44)
AST: 14 U/L — ABNORMAL LOW (ref 15–41)
Albumin: 2.4 g/dL — ABNORMAL LOW (ref 3.5–5.0)
Alkaline Phosphatase: 68 U/L (ref 38–126)
Anion gap: 5 (ref 5–15)
BUN: <5 mg/dL — ABNORMAL LOW (ref 6–20)
CO2: 22 mmol/L (ref 22–32)
Calcium: 8.5 mg/dL — ABNORMAL LOW (ref 8.9–10.3)
Chloride: 110 mmol/L (ref 98–111)
Creatinine, Ser: 0.65 mg/dL (ref 0.44–1.00)
GFR, Estimated: >60 mL/min (ref >60)
Glucose, Bld: 81 mg/dL (ref 70–99)
Potassium: 3.5 mmol/L (ref 3.5–5.1)
Sodium: 137 mmol/L (ref 135–145)
Total Bilirubin: 0.3 mg/dL (ref 0.0–1.2)
Total Protein: 6.1 g/dL — ABNORMAL LOW (ref 6.5–8.1)

## 2023-05-27 LAB — URINALYSIS, ROUTINE W REFLEX MICROSCOPIC
Bilirubin Urine: NEGATIVE
Glucose, UA: NEGATIVE mg/dL
Hgb urine dipstick: NEGATIVE
Ketones, ur: NEGATIVE mg/dL
Nitrite: NEGATIVE
Protein, ur: 30 mg/dL — AB
Specific Gravity, Urine: 1.017 (ref 1.005–1.030)
pH: 6 (ref 5.0–8.0)

## 2023-05-27 LAB — PROTEIN / CREATININE RATIO, URINE
Creatinine, Urine: 182 mg/dL
Protein Creatinine Ratio: 0.16 mg/mg{creat} — ABNORMAL HIGH (ref 0.00–0.15)
Total Protein, Urine: 30 mg/dL

## 2023-05-27 LAB — CBC
HCT: 34.8 % — ABNORMAL LOW (ref 36.0–46.0)
Hemoglobin: 11.4 g/dL — ABNORMAL LOW (ref 12.0–15.0)
MCH: 28.3 pg (ref 26.0–34.0)
MCHC: 32.8 g/dL (ref 30.0–36.0)
MCV: 86.4 fL (ref 80.0–100.0)
Platelets: 208 10*3/uL (ref 150–400)
RBC: 4.03 MIL/uL (ref 3.87–5.11)
RDW: 14.6 % (ref 11.5–15.5)
WBC: 5.5 10*3/uL (ref 4.0–10.5)
nRBC: 0 % (ref 0.0–0.2)

## 2023-05-27 MED ORDER — BUTALBITAL-APAP-CAFFEINE 50-325-40 MG PO TABS
2.0000 | ORAL_TABLET | Freq: Once | ORAL | Status: AC
  Administered 2023-05-27: 2 via ORAL
  Filled 2023-05-27: qty 2

## 2023-05-27 NOTE — MAU Note (Signed)
Samantha Stanton is a 27 y.o. at Unknown here in MAU reporting: reporting she recently moved backed to [facility] and hasn't established care yet, last PNV was 05/09/2023 in [place].  States she's had a HA since yesterday, took Tylenol today @ 0800, no relief noted.  Also c/o decreased FM, reports feeling movement but less than usual.   Denies VB or LOF.  States she thinks she may be having ctxs.  LMP: NA Onset of complaint: today Pain score: 8 Vitals:   05/27/23 1545  BP: 131/78  Pulse: 84  Resp: 18  Temp: 98.2 F (36.8 C)  SpO2: 100%     FHT:146 bpm Lab orders placed from triage: UA

## 2023-05-27 NOTE — MAU Provider Note (Incomplete)
Chief Complaint  Patient presents with   Headache   Decreased Fetal Movement   Contractions     Event Date/Time   First Provider Initiated Contact with Patient 05/27/23 1617        S: Samantha Stanton  is a 27 y.o. y.o. year old [phone] female at [redacted]w[redacted]d weeks gestation who presents to MAU with Ha and concern for Pre- E due to Hx Pre-E. elevated blood pressures. *** Hx HTN. Current blood pressure medication: ***  Samantha Stanton is a 27 y.o. at Unknown here in MAU reporting: reporting she recently moved backed to [facility] and hasn't established care yet, last PNV was 05/09/2023 in [place].  States she's had a HA since yesterday, took Tylenol today @ 0800, no relief noted.  Also c/o decreased FM, reports feeling movement but less than usual.   Denies VB or LOF.  States she thinks she may be having ctxs.   LMP: NA Onset of complaint: today Pain score: 8  FHT:146 bpm Lab orders placed from triage: UA   Associated symptoms: ***Headache, ***vision changes, ***epigastric pain Contractions: *** Vaginal bleeding: *** Fetal movement: ***  O: Patient Vitals for the past 24 hrs:  BP Temp Temp src Pulse Resp SpO2 Height Weight  05/27/23 1830 (!) 151/85 -- -- 72 -- -- -- --  05/27/23 1825 (!) 154/88 -- -- 62 -- -- -- --  05/27/23 1740 139/87 -- -- 67 -- 100 % -- --  05/27/23 1615 133/81 -- -- 78 -- -- -- --  05/27/23 1545 131/78 98.2 F (36.8 C) Oral 84 18 100 % -- --  05/27/23 1539 -- -- -- -- -- -- 5\' 4"  (1.626 m) 113.4 kg   General: NAD Heart: Regular rate Lungs: Normal rate and effort Abd: Soft, NT, Gravid, S=D Extremities: ***Pedal edema Neuro: 2+ deep tendon reflexes, No clonus Pelvic: NEFG, no bleeding or LOF.   Dilation: Closed Exam by:: Dorathy Kinsman, CNM  EFM: 150, Moderate variability, 15 x 15 accelerations, no decelerations Toco: Q***, ***  Results for orders placed or performed during the hospital encounter of 05/27/23 (from the past 24 hours)  Urinalysis, Routine w  reflex microscopic -Urine, Clean Catch     Status: Abnormal   Collection Time: 05/27/23  4:03 PM  Result Value Ref Range   Color, Urine YELLOW YELLOW   APPearance CLOUDY (A) CLEAR   Specific Gravity, Urine 1.017 1.005 - 1.030   pH 6.0 5.0 - 8.0   Glucose, UA NEGATIVE NEGATIVE mg/dL   Hgb urine dipstick NEGATIVE NEGATIVE   Bilirubin Urine NEGATIVE NEGATIVE   Ketones, ur NEGATIVE NEGATIVE mg/dL   Protein, ur 30 (A) NEGATIVE mg/dL   Nitrite NEGATIVE NEGATIVE   Leukocytes,Ua LARGE (A) NEGATIVE   RBC / HPF 0-5 0 - 5 RBC/hpf   WBC, UA 11-20 0 - 5 WBC/hpf   Bacteria, UA FEW (A) NONE SEEN   Squamous Epithelial / HPF 21-50 0 - 5 /HPF   Mucus PRESENT   Protein / creatinine ratio, urine     Status: Abnormal   Collection Time: 05/27/23  4:03 PM  Result Value Ref Range   Creatinine, Urine 182 mg/dL   Total Protein, Urine 30 mg/dL   Protein Creatinine Ratio 0.16 (H) 0.00 - 0.15 mg/mg[Cre]  CBC     Status: Abnormal   Collection Time: 05/27/23  5:34 PM  Result Value Ref Range   WBC 5.5 4.0 - 10.5 K/uL   RBC 4.03 3.87 - 5.11 MIL/uL   Hemoglobin 11.4 (  L) 12.0 - 15.0 g/dL   HCT 09.8 (L) 11.9 - 14.7 %   MCV 86.4 80.0 - 100.0 fL   MCH 28.3 26.0 - 34.0 pg   MCHC 32.8 30.0 - 36.0 g/dL   RDW 82.9 56.2 - 13.0 %   Platelets 208 150 - 400 K/uL   nRBC 0.0 0.0 - 0.2 %  Comprehensive metabolic panel     Status: Abnormal   Collection Time: 05/27/23  5:34 PM  Result Value Ref Range   Sodium 137 135 - 145 mmol/L   Potassium 3.5 3.5 - 5.1 mmol/L   Chloride 110 98 - 111 mmol/L   CO2 22 22 - 32 mmol/L   Glucose, Bld 81 70 - 99 mg/dL   BUN <5 (L) 6 - 20 mg/dL   Creatinine, Ser 8.65 0.44 - 1.00 mg/dL   Calcium 8.5 (L) 8.9 - 10.3 mg/dL   Total Protein 6.1 (L) 6.5 - 8.1 g/dL   Albumin 2.4 (L) 3.5 - 5.0 g/dL   AST 14 (L) 15 - 41 U/L   ALT 10 0 - 44 U/L   Alkaline Phosphatase 68 38 - 126 U/L   Total Bilirubin 0.3 0.0 - 1.2 mg/dL   GFR, Estimated >78 >46 mL/min   Anion gap 5 5 - 15    MAU  Course Orders Placed This Encounter  Procedures   Culture, beta strep (group b only)    Standing Status:   Standing    Number of Occurrences:   1   [place] MFM FETAL BPP WO NON STRESS    Standing Status:   Standing    Number of Occurrences:   1    Symptom/Reason for Exam:   Decreased fetal movement affecting management of pregnancy in third trimester [[id number]]    Symptom/Reason for Exam:   [redacted] weeks gestation of pregnancy [[id number]]   Urinalysis, Routine w reflex microscopic -Urine, Clean Catch    Standing Status:   Standing    Number of Occurrences:   1    Specimen Source:   Urine, Clean Catch [76]   CBC    Standing Status:   Standing    Number of Occurrences:   1   Comprehensive metabolic panel    Standing Status:   Standing    Number of Occurrences:   1   Protein / creatinine ratio, urine    Standing Status:   Standing    Number of Occurrences:   1   Discharge patient    Discharge disposition:   01-Home or Self Care [1]    Discharge patient date:   05/27/2023   Meds ordered this encounter  Medications   butalbital-acetaminophen-caffeine (FIORICET) 50-325-40 MG per tablet 2 tablet     MDM   A: [redacted]w[redacted]d week IUP *** Preeclampsia/Gestational hypertension/Chronic hypertension/chronic hypertension with superimposed preeclampsia FHR reactive  P: Discharge home in stable condition per consult with Warden Fillers, MD./Admit to *** per consult with Warden Fillers, MD. Preeclampsia precautions. Follow-up for blood pressure check in *** days at your doctor's office sooner as needed if symptoms worsen. Return to maternity admissions as needed in emergencies  [place], [place], [place] 05/27/2023 7:27 PM

## 2023-05-28 ENCOUNTER — Telehealth: Payer: Self-pay | Admitting: Family Medicine

## 2023-05-28 NOTE — Telephone Encounter (Signed)
Attempted to call patient, phone number does not work.

## 2023-05-29 NOTE — L&D Delivery Note (Signed)
 OB/GYN Faculty Practice Delivery Note  Samantha Stanton is a 28 y.o. [id number] s/p SVD at [redacted]w[redacted]d. She was admitted for gHTN.   ROM: 10h 49m with clear fluid GBS Status: Negative/-- (01/08 1207) Maximum Maternal Temperature: Temp (24hrs), Avg:98.4 F (36.9 C), Min:98 F (36.7 C), Max:99 F (37.2 C)   Labor Progress: Initial SVE: 2/thick/ballotable. AROM, Pitocin , and IP Foley required. She then progressed to complete.   Delivery Date/Time: 06/10/23 0848 Delivery: Responded to overhead page for staff assist. Bedside RN with babe in her hands drying and stimulating upon my arrival. Patient with variables and was checked and head found to be delivered. Mom pushed once for delivery of shoulders and body per RN report. Cord clamped x 2 after +1-minute delay, and cut by mom. Cord gases not obtained. Cord blood drawn. Placenta delivered spontaneously with gentle cord traction. Fundus firm with massage and Pitocin . Labia, perineum, and vagina inspected with no lacerations. Mom and baby to postpartum. Baby Weight: pending  Placenta: 3 vessel, intact. Sent to L&D Complications: Precipitous delivery Lacerations: None EBL: 89 mL Anesthesia: epidural  Infant: baby girl Kamille APGAR (1 MIN): 9 APGAR (5 MINS): 9  APGAR (10 MINS):    Almarie CHRISTELLA Moats, MD [facility] Family Medicine Fellow, [facility] for [facility], [facility] 06/10/2023, 9:35 AM

## 2023-06-03 DIAGNOSIS — O321XX0 Maternal care for breech presentation, not applicable or unspecified: Secondary | ICD-10-CM | POA: Insufficient documentation

## 2023-06-05 ENCOUNTER — Encounter (HOSPITAL_COMMUNITY): Payer: Self-pay | Admitting: Obstetrics & Gynecology

## 2023-06-05 ENCOUNTER — Encounter: Payer: Self-pay | Admitting: Family Medicine

## 2023-06-05 ENCOUNTER — Other Ambulatory Visit: Payer: Self-pay

## 2023-06-05 ENCOUNTER — Other Ambulatory Visit (HOSPITAL_COMMUNITY)
Admission: RE | Admit: 2023-06-05 | Discharge: 2023-06-05 | Disposition: A | Discharge status: Home / Self Care | Payer: Self-pay | Source: Ambulatory Visit | Attending: Family Medicine | Admitting: Family Medicine

## 2023-06-05 ENCOUNTER — Inpatient Hospital Stay (HOSPITAL_COMMUNITY)
Admission: AD | Admit: 2023-06-05 | Discharge: 2023-06-05 | Disposition: A | Discharge status: Home / Self Care | Payer: Self-pay | Attending: Obstetrics & Gynecology | Admitting: Obstetrics & Gynecology

## 2023-06-05 ENCOUNTER — Ambulatory Visit: Payer: Medicaid Other | Admitting: Family Medicine

## 2023-06-05 VITALS — BP 132/86 | HR 81 | Wt 252.8 lb

## 2023-06-05 DIAGNOSIS — Z3A36 36 weeks gestation of pregnancy: Secondary | ICD-10-CM

## 2023-06-05 DIAGNOSIS — O98813 Other maternal infectious and parasitic diseases complicating pregnancy, third trimester: Secondary | ICD-10-CM | POA: Insufficient documentation

## 2023-06-05 DIAGNOSIS — O0993 Supervision of high risk pregnancy, unspecified, third trimester: Secondary | ICD-10-CM | POA: Diagnosis not present

## 2023-06-05 DIAGNOSIS — O133 Gestational [pregnancy-induced] hypertension without significant proteinuria, third trimester: Secondary | ICD-10-CM

## 2023-06-05 DIAGNOSIS — B9689 Other specified bacterial agents as the cause of diseases classified elsewhere: Secondary | ICD-10-CM | POA: Insufficient documentation

## 2023-06-05 DIAGNOSIS — B3731 Acute candidiasis of vulva and vagina: Secondary | ICD-10-CM | POA: Diagnosis not present

## 2023-06-05 DIAGNOSIS — O163 Unspecified maternal hypertension, third trimester: Secondary | ICD-10-CM | POA: Diagnosis not present

## 2023-06-05 DIAGNOSIS — O321XX0 Maternal care for breech presentation, not applicable or unspecified: Secondary | ICD-10-CM | POA: Diagnosis not present

## 2023-06-05 DIAGNOSIS — O23593 Infection of other part of genital tract in pregnancy, third trimester: Secondary | ICD-10-CM | POA: Insufficient documentation

## 2023-06-05 LAB — PROTEIN / CREATININE RATIO, URINE
Creatinine, Urine: 242 mg/dL
Protein Creatinine Ratio: 0.09 mg/mg{creat} (ref 0.00–0.15)
Total Protein, Urine: 22 mg/dL

## 2023-06-05 LAB — URINALYSIS, ROUTINE W REFLEX MICROSCOPIC
Bilirubin Urine: NEGATIVE
Glucose, UA: NEGATIVE mg/dL
Hgb urine dipstick: NEGATIVE
Ketones, ur: NEGATIVE mg/dL
Nitrite: NEGATIVE
Protein, ur: NEGATIVE mg/dL
Specific Gravity, Urine: 1.02 (ref 1.005–1.030)
pH: 6 (ref 5.0–8.0)

## 2023-06-05 LAB — COMPREHENSIVE METABOLIC PANEL
ALT: 11 U/L (ref 0–44)
AST: 13 U/L — ABNORMAL LOW (ref 15–41)
Albumin: 2.6 g/dL — ABNORMAL LOW (ref 3.5–5.0)
Alkaline Phosphatase: 77 U/L (ref 38–126)
Anion gap: 8 (ref 5–15)
BUN: 7 mg/dL (ref 6–20)
CO2: 21 mmol/L — ABNORMAL LOW (ref 22–32)
Calcium: 8.7 mg/dL — ABNORMAL LOW (ref 8.9–10.3)
Chloride: 109 mmol/L (ref 98–111)
Creatinine, Ser: 0.74 mg/dL (ref 0.44–1.00)
GFR, Estimated: >60 mL/min (ref >60)
Glucose, Bld: 73 mg/dL (ref 70–99)
Potassium: 3.9 mmol/L (ref 3.5–5.1)
Sodium: 138 mmol/L (ref 135–145)
Total Bilirubin: 0.3 mg/dL (ref 0.0–1.2)
Total Protein: 6.4 g/dL — ABNORMAL LOW (ref 6.5–8.1)

## 2023-06-05 LAB — CBC
HCT: 36.3 % (ref 36.0–46.0)
Hemoglobin: 11.9 g/dL — ABNORMAL LOW (ref 12.0–15.0)
MCH: 28.3 pg (ref 26.0–34.0)
MCHC: 32.8 g/dL (ref 30.0–36.0)
MCV: 86.2 fL (ref 80.0–100.0)
Platelets: 223 10*3/uL (ref 150–400)
RBC: 4.21 MIL/uL (ref 3.87–5.11)
RDW: 15.1 % (ref 11.5–15.5)
WBC: 5.5 10*3/uL (ref 4.0–10.5)
nRBC: 0 % (ref 0.0–0.2)

## 2023-06-05 LAB — WET PREP, GENITAL
Clue Cells Wet Prep HPF POC: NONE SEEN
Sperm: NONE SEEN
Trich, Wet Prep: NONE SEEN
WBC, Wet Prep HPF POC: >=10 — AB (ref <10)

## 2023-06-05 LAB — POCT FERN TEST: POCT Fern Test: NEGATIVE

## 2023-06-05 LAB — RUPTURE OF MEMBRANE (ROM)PLUS: Rom Plus: NEGATIVE

## 2023-06-05 MED ORDER — FLUCONAZOLE 150 MG PO TABS: 150.0000 mg | ORAL_TABLET | Freq: Once | ORAL | 0 refills | Status: AC

## 2023-06-05 MED ORDER — LACTATED RINGERS IV BOLUS
1000.0000 mL | Freq: Once | INTRAVENOUS | Status: AC
  Administered 2023-06-05: 1000 mL via INTRAVENOUS

## 2023-06-05 NOTE — Progress Notes (Signed)
 Subjective:   Samantha Stanton is a 28 y.o. [id number] at [redacted]w[redacted]d by midtrimester ultrasound being seen today for her first obstetrical visit.  Her obstetrical history is significant for obesity and history of pre-eclampsia, preterm fetal demise, SAB x3, IAB x1 . Patient does intend to breast feed. Pregnancy history fully reviewed.  PNC started at 17w at [facility]. Patient can't access her own records but had monthly visits until 28w, then every 2 weeks until moving back to [place] 3 weeks ago. -EDD based on US  at 17w: 06/30/2023 -received CBC, blood typing and antibody screening -nl STI screening, hx of Chlamydia from chart review -doesn't know genetic screening results from this pregnancy, silent alpha thal carrier from previous pregnancies' screening -didn't get oral glucose tolerance test -nl fetal anatomy screening -doesn't think she got Tdap, RSV vaccine -hasn't done kick count but feels baby kicks a lot, got NST done in MAU 05/27/2023 -hasn't done 3rd trimester RPR labs, GBS swab -has BP cuff at home, measures BID usually mid-130s/mid-80s, not on aspirin because she has previously bled on aspirin  Patient reports occasional contractions (regular, more uncomfortable than pain, no bleeding, some liquid discharge but states she sometimes pees with Valsalva and wears pads), SOB, BLE edema during day that resolves in night, fetal movements, anxiety (lost baby 2 years ago, stopped anxiolytics based on med advice, anxiety worsening this pregnancy, has support system, not doing therapy but has seen [facility] before and wants to see them postpartum, denies SI/HI currently).  Patient denies HA (previously had HA and went to MAU/ED), vision changes, CP, nausea, vomiting, abdominal pain, vaginal bleeding, changes in vaginal discharge.  HISTORY: OB History  Gravida Para Term Preterm AB Living  9 4 3 1 4 3   SAB IAB Ectopic Multiple Live Births  3 1 0 0 3    # Outcome Date GA Lbr Len/2nd Weight  Sex Type Anes PTL Lv  9 Current           8 SAB 05/2022 [redacted]w[redacted]d         7 Preterm 06/08/21 [redacted]w[redacted]d   F TAB None N FD     Name: Deneen Botts     Apgar1: 0  Apgar5: 0  6 SAB 2022 [redacted]w[redacted]d         5 IAB 2022 [redacted]w[redacted]d         4 Term 12/16/19    M Vag-Spont   LIV  3 Term 12/14/18 [redacted]w[redacted]d 06:05 / 00:09 6 lb 3.1 oz (2.809 kg) M Vag-Spont EPI  LIV     Birth Comments: HTN, preeclampsia     Name: Louthan,BOY Dot     Apgar1: 8  Apgar5: 9  2 SAB 04/17/17 [redacted]w[redacted]d    SAB        Birth Comments: had D&C  1 Term 08/25/13 [redacted]w[redacted]d  7 lb 11 oz (3.487 kg) M Vag-Spont EPI N LIV     Complications: Gestational hypertension     Name: Larita Franks   Last pap smear was  2022 and was normal Past Medical History:  Diagnosis Date   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  Consider MFM consult.   Alpha thalassemia silent carrier 05/10/2021   Anxiety    Bleeding disorder (HCC)    Cannot take ASA   Chlamydia infection affecting pregnancy 2013   Hypertension    Gestational HTN   Menorrhagia 11/11/2018  Pregnancy induced hypertension    with last pregnancy   Past Surgical History:  Procedure Laterality Date   DILATION AND CURETTAGE OF UTERUS     DILATION AND EVACUATION N/A 06/08/2021   Procedure: DILATATION AND EVACUATION;  Surgeon: Eveline Lynwood MATSU, MD;  Location: [facility] LD [facility]S;  Service: Gynecology;  Laterality: N/A;   WISDOM TOOTH EXTRACTION     Family History  Problem Relation Age of Onset   Diabetes Mother    Hypertension Mother    Cancer Sister        Leukemia   Social History   Tobacco Use   Smoking status: Never   Smokeless tobacco: Never  Vaping Use   Vaping status: Never Used  Substance Use Topics   Alcohol use: Not Currently   Drug use: No   Allergies  Allergen Reactions   Aspirin Other (See Comments)    Causes nose bleeds and frequent periods - age 50. Saw hematologist and told not to take ASA    Current Outpatient Medications on File Prior to Visit  Medication Sig Dispense Refill   Prenatal Vit-Fe Fumarate-FA (MULTIVITAMIN-PRENATAL) 27-0.8 MG TABS tablet Take 1 tablet by mouth daily at 12 noon.     Multiple Vitamins-Minerals (HAIR SKIN AND NAILS FORMULA PO) Take 2 tablets by mouth daily. (Patient not taking: Reported on 06/05/2023)     Probiotic Product (PROBIOTIC DAILY PO) Take 2 tablets by mouth daily. (Patient not taking: Reported on 06/05/2023)     No current facility-administered medications on file prior to visit.   Exam   Vitals:   06/05/23 1004  BP: 132/86  Pulse: 81  Weight: 252 lb 12.8 oz (114.7 kg)   Fetal Heart Rate (bpm): 143  Uterus:     Pelvic Exam: Perineum: no hemorrhoids, normal perineum   Vulva: normal external genitalia, no lesions   Vagina:  normal mucosa, white thick discharge   Cervix: no lesions, fluid detected   Adnexa: normal adnexa and no mass, fullness, tenderness   Bony Pelvis: average  System: General: well-developed, well-nourished female in no acute distress   Breast:  normal appearance, no masses or tenderness   Skin: normal coloration and turgor, no rashes   Neurologic: oriented, normal, negative, normal mood   Extremities: normal strength, tone, and muscle mass, ROM of all joints is normal   HEENT PERRLA, extraocular movement intact and sclera clear, anicteric   Mouth/Teeth mucous membranes moist, pharynx normal without lesions and dental hygiene good   Neck supple and no masses   Cardiovascular: regular rate and rhythm   Respiratory:  no respiratory distress, normal breath sounds   Abdomen: soft, non-tender; bowel sounds normal; no masses,  no organomegaly     Assessment:   Pregnancy: [id number] Patient Active Problem List   Diagnosis Date Noted   Breech presentation 06/03/2023   Alpha thalassemia silent carrier 05/10/2021   Supervision of high risk pregnancy in third trimester 03/21/2021   ADHD (attention deficit hyperactivity  disorder) 11/11/2018   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 Supervision of high risk pregnancy in third trimester (Primary) - History of preterm delivery and preeclampsia - Concern for possible rupture of membranes based on physical exam findings, ordered fern test and sent to MAU for labor evaluation  2. Breech presentation, single or unspecified fetus - Previously breech presentation on 05/27/23 - Office POCUS shows head at vertex today, most likely unstable fetal lie - Discussed breech delivery vs C-section  3. Elevated blood pressure complicating pregnancy,  antepartum, third trimester - Patient is doing well today and denied HA, vision changes, seizures - Office BP <140/90 today, ordered urinalysis and protein creatinine ratio  4. [redacted] weeks gestation of pregnancy - STI screening, urinalysis, GBS today - Hasn't received oral glucose tolerance test this pregnancy, scheduled 06/06/2023 - 1 week follow-up scheduled 06/12/2023  Continue prenatal vitamins. Genetic Screening discussed, First trimester screen, Quad screen, and NIPS:  records requested . Ultrasound discussed; fetal anatomic survey:  records requested . Problem list reviewed and updated. The nature of [facility] Faculty Practice with multiple MDs and other Advanced Practice Providers was explained to patient; also emphasized that residents, students are part of our team. Routine obstetric precautions reviewed. No follow-ups on file.  Bearl Feeling, MS3

## 2023-06-05 NOTE — MAU Note (Signed)
.  Samantha Stanton is a 28 y.o. at [redacted]w[redacted]d here in MAU reporting: patient presents for possible rupture of membranes. Questionable leaking for 3 days. She has been wearing a pad.Was seen in the office today and sent over for evaluation. Unsure if she is contracting. Denies VB. +FM  Onset of complaint: possibly 2 days Pain score: no pain Vitals:   06/05/23 1314 06/05/23 1320  BP: (!) 142/92 (!) 144/93  Pulse: 79 78  Resp:    Temp:    SpO2: 99% 99%     FHT:165 Lab orders placed from triage: urine and fern obtained

## 2023-06-05 NOTE — MAU Provider Note (Signed)
 History     CSN: [id number]  Arrival date and time: 06/05/23 1236   Event Date/Time   First Provider Initiated Contact with Patient 06/05/23 1415      Chief Complaint  Patient presents with   Rupture of Membranes   HPI [name] is a 28 y.o. year old [phone] female at [redacted]w[redacted]d weeks gestation who was sent to MAU from [facility]  reporting leaking of fluid x 3 days. She has been wearing a pad. She was there today for her NOB visit. The provider suspects the watery d/c is from a yeast infection, but wanted to exercise extreme caution and send her to MAU. She moved her from [place], [place]  3 weeks ago. She is originally from [place] and has had babies at this hospital before. She reports her last baby was born here 4 years ago. Her pregnancy is significant for h/o PEC, breech on 05/27/2023, but vertex by bedside U/S today. She reports she had a 24 week C/S for IUFD on 06/08/2021.   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  Consider MFM consult.   Alpha thalassemia silent carrier 05/10/2021   Anxiety    Bleeding disorder (HCC)    Cannot take ASA   Chlamydia infection affecting pregnancy 2013   Hypertension    Gestational HTN   Menorrhagia 11/11/2018   Pregnancy induced hypertension    with last pregnancy    Past Surgical History:  Procedure Laterality Date   DILATION AND CURETTAGE OF UTERUS     DILATION AND EVACUATION N/A 06/08/2021   Procedure: DILATATION AND EVACUATION;  Surgeon: Eveline Lynwood MATSU, MD;  Location: [facility] LD ORS;  Service: Gynecology;  Laterality: N/A;   WISDOM TOOTH EXTRACTION      Family History  Problem Relation Age of Onset   Diabetes Mother     Hypertension Mother    Cancer Sister        Leukemia    Social History   Tobacco Use   Smoking status: Never   Smokeless tobacco: Never  Vaping Use   Vaping status: Never Used  Substance Use Topics   Alcohol use: Not Currently   Drug use: No    Allergies:  Allergies  Allergen Reactions   Aspirin Other (See Comments)    Causes nose bleeds and frequent periods - age 47. Saw hematologist and told not to take ASA    No medications prior to admission.    Review of Systems  Constitutional: Negative.   HENT: Negative.    Eyes: Negative.   Respiratory: Negative.    Cardiovascular: Negative.   Gastrointestinal: Negative.   Endocrine: Negative.   Genitourinary:  Positive for vaginal discharge (leaking fluid x 3 days).  Musculoskeletal: Negative.   Skin: Negative.   Allergic/Immunologic: Negative.   Neurological: Negative.   Hematological: Negative.   Psychiatric/Behavioral: Negative.     Physical Exam   Patient Vitals for the past 24 hrs:  BP Temp  Temp src Pulse Resp SpO2 Height Weight  06/05/23 1716 (!) 152/84 -- -- 65 -- -- -- --  06/05/23 1701 135/87 -- -- 69 -- -- -- --  06/05/23 1646 (!) 145/85 -- -- 71 -- -- -- --  06/05/23 1601 (!) 142/88 -- -- 76 -- -- -- --  06/05/23 1546 (!) 141/82 -- -- 73 -- -- -- --  06/05/23 1531 (!) 144/88 -- -- 78 -- -- -- --  06/05/23 1516 (!) 153/82 -- -- 70 -- -- -- --  06/05/23 1501 (!) 145/83 -- -- 71 -- -- -- --  06/05/23 1446 (!) 140/72 -- -- 61 -- -- -- --  06/05/23 1431 (!) 142/75 -- -- 61 -- -- -- --  06/05/23 1416 (!) 141/83 -- -- 66 -- -- -- --  06/05/23 1400 (!) 140/80 -- -- 70 -- 98 % -- --  06/05/23 1346 136/86 -- -- 69 -- 99 % -- --  06/05/23 1331 (!) 151/87 -- -- 71 -- -- -- --  06/05/23 1320 (!) 144/93 -- -- 78 -- 99 % -- --  06/05/23 1314 (!) 142/92 -- -- 79 -- 99 % -- --  06/05/23 1302 136/88 98.6 F (37 C) Oral 75 18 100 % 5' 4 (1.626 m) 110.2 kg   Physical Exam Vitals and nursing note reviewed. Exam  conducted with a chaperone present.  Constitutional:      Appearance: Normal appearance. She is obese.  Cardiovascular:     Rate and Rhythm: Normal rate.  Pulmonary:     Effort: Pulmonary effort is normal.  Abdominal:     Palpations: Abdomen is soft.  Genitourinary:    General: Normal vulva.     Comments: Pelvic exam: External genitalia normal, SE: vaginal walls pink and well rugated, cervix is smooth, pink, no lesions, small amt of clear fluid with adherent white vaginal d/c -- WP, GC/CT, fern slide, and ROM Plus done, Uterus is non-tender, S=D, no CMT or friability, no adnexal tenderness.  Dilation: Ext Os= 2.5 cm, Int Os= Closed Effacement (%): Thick Station:  (high) Exam by:: Letha CNM  Musculoskeletal:        General: Normal range of motion.  Skin:    General: Skin is warm and dry.  Neurological:     Mental Status: She is alert and oriented to person, place, and time.  Psychiatric:        Mood and Affect: Mood normal.        Behavior: Behavior normal.        Thought Content: Thought content normal.        Judgment: Judgment normal.    REACTIVE NST - FHR: 145 bpm / moderate variability / accels present / decels absent / TOCO: irregular UC's with UI noted   MAU Course  Procedures  MDM CCUA CBC CMP P/C Ratio Serial BP's  Wet Prep GC/CT -- Results pending GBS Fern Slide ROM Plus  Results for orders placed or performed during the hospital encounter of 06/05/23 (from the past 24 hours)  Urinalysis, Routine w reflex microscopic -Urine, Clean Catch     Status: Abnormal   Collection Time: 06/05/23  1:00 PM  Result Value Ref Range   Color, Urine YELLOW YELLOW   APPearance HAZY (A) CLEAR   Specific Gravity, Urine 1.020 1.005 - 1.030   pH 6.0 5.0 - 8.0   Glucose, UA NEGATIVE NEGATIVE mg/dL   Hgb urine dipstick NEGATIVE NEGATIVE   Bilirubin Urine NEGATIVE NEGATIVE   Ketones, ur  NEGATIVE NEGATIVE mg/dL   Protein, ur NEGATIVE NEGATIVE mg/dL   Nitrite NEGATIVE  NEGATIVE   Leukocytes,Ua LARGE (A) NEGATIVE   RBC / HPF 0-5 0 - 5 RBC/hpf   WBC, UA 11-20 0 - 5 WBC/hpf   Bacteria, UA FEW (A) NONE SEEN   Squamous Epithelial / HPF 6-10 0 - 5 /HPF   Mucus PRESENT   Fern Test     Status: None   Collection Time: 06/05/23  1:20 PM  Result Value Ref Range   POCT Fern Test Negative = intact amniotic membranes   Protein / creatinine ratio, urine     Status: None   Collection Time: 06/05/23  1:41 PM  Result Value Ref Range   Creatinine, Urine 242 mg/dL   Total Protein, Urine 22 mg/dL   Protein Creatinine Ratio 0.09 0.00 - 0.15 mg/mg[Cre]  Wet prep, genital     Status: Abnormal   Collection Time: 06/05/23  2:17 PM   Specimen: Vaginal  Result Value Ref Range   Yeast Wet Prep HPF POC PRESENT (A) NONE SEEN   Trich, Wet Prep NONE SEEN NONE SEEN   Clue Cells Wet Prep HPF POC NONE SEEN NONE SEEN   WBC, Wet Prep HPF POC >=10 (A) <10   Sperm NONE SEEN   Rupture of Membrane (ROM) Plus     Status: None   Collection Time: 06/05/23  2:21 PM  Result Value Ref Range   Rom Plus NEGATIVE   CBC     Status: Abnormal   Collection Time: 06/05/23  2:44 PM  Result Value Ref Range   WBC 5.5 4.0 - 10.5 K/uL   RBC 4.21 3.87 - 5.11 MIL/uL   Hemoglobin 11.9 (L) 12.0 - 15.0 g/dL   HCT 63.6 63.9 - 53.9 %   MCV 86.2 80.0 - 100.0 fL   MCH 28.3 26.0 - 34.0 pg   MCHC 32.8 30.0 - 36.0 g/dL   RDW 84.8 88.4 - 84.4 %   Platelets 223 150 - 400 K/uL   nRBC 0.0 0.0 - 0.2 %  Comprehensive metabolic panel     Status: Abnormal   Collection Time: 06/05/23  2:44 PM  Result Value Ref Range   Sodium 138 135 - 145 mmol/L   Potassium 3.9 3.5 - 5.1 mmol/L   Chloride 109 98 - 111 mmol/L   CO2 21 (L) 22 - 32 mmol/L   Glucose, Bld 73 70 - 99 mg/dL   BUN 7 6 - 20 mg/dL   Creatinine, Ser 9.25 0.44 - 1.00 mg/dL   Calcium 8.7 (L) 8.9 - 10.3 mg/dL   Total Protein 6.4 (L) 6.5 - 8.1 g/dL   Albumin 2.6 (L) 3.5 - 5.0 g/dL   AST 13 (L) 15 - 41 U/L   ALT 11 0 - 44 U/L   Alkaline Phosphatase 77  38 - 126 U/L   Total Bilirubin 0.3 0.0 - 1.2 mg/dL   GFR, Estimated >39 >39 mL/min   Anion gap 8 5 - 15     Assessment and Plan  1. Gestational hypertension, third trimester (Primary) - Scheduled for IOL at 37 weeks >>06/09/2023 - Advised not to just show up at hospital to await a call from L&D charge RN with instructions on when to come for IOL appt - Information provided on labor induction, FB for cervical ripening and gHTN   2. Candida vaginitis - prescription for: Diflucan  150 mg po x 1 tablet  3. [redacted] weeks gestation of pregnancy - Expressed deep  heartfelt sympathies that unable to schedule IOL on any other day besides 06/09/2023. Advised of more IOL pts scheduled on 1/13 & 1/14 and unable to schedule her on 1/11 due to her GA on that date. Offered to ask if someone could be moved to another day and she be scheduled for 06/10/2023. She said it was ok to keep it the 06/09/2023 date. I am a big girl. I can make it through.  - Discharge patient - Patient verbalized an understanding of the plan of care and agrees.    Brandyn Lowrey, CNM 06/05/2023, 2:15 PM

## 2023-06-05 NOTE — Patient Instructions (Signed)

## 2023-06-06 ENCOUNTER — Telehealth (HOSPITAL_COMMUNITY): Payer: Self-pay | Admitting: *Deleted

## 2023-06-06 ENCOUNTER — Other Ambulatory Visit: Payer: Medicaid Other

## 2023-06-06 ENCOUNTER — Other Ambulatory Visit: Payer: Self-pay | Admitting: *Deleted

## 2023-06-06 ENCOUNTER — Encounter (HOSPITAL_COMMUNITY): Payer: Self-pay

## 2023-06-06 ENCOUNTER — Encounter: Payer: Self-pay | Admitting: Family Medicine

## 2023-06-06 ENCOUNTER — Encounter: Payer: Self-pay | Admitting: *Deleted

## 2023-06-06 DIAGNOSIS — O0993 Supervision of high risk pregnancy, unspecified, third trimester: Secondary | ICD-10-CM

## 2023-06-06 LAB — CERVICOVAGINAL ANCILLARY ONLY
Bacterial Vaginitis (gardnerella): POSITIVE — AB
Candida Glabrata: NEGATIVE
Candida Vaginitis: POSITIVE — AB
Chlamydia: NEGATIVE
Comment: NEGATIVE
Comment: NEGATIVE
Comment: NEGATIVE
Comment: NEGATIVE
Comment: NEGATIVE
Comment: NORMAL
Neisseria Gonorrhea: NEGATIVE
Trichomonas: NEGATIVE

## 2023-06-06 LAB — GC/CHLAMYDIA PROBE AMP (~~LOC~~) NOT AT ARMC
Chlamydia: NEGATIVE
Comment: NEGATIVE
Comment: NORMAL
Neisseria Gonorrhea: NEGATIVE

## 2023-06-06 MED ORDER — METRONIDAZOLE 500 MG PO TABS: 500.0000 mg | ORAL_TABLET | Freq: Two times a day (BID) | ORAL | 0 refills | Status: DC

## 2023-06-06 MED ORDER — FLUCONAZOLE 150 MG PO TABS: 150.0000 mg | ORAL_TABLET | Freq: Once | ORAL | 0 refills | Status: AC

## 2023-06-06 NOTE — Telephone Encounter (Signed)
 Preadmission screen

## 2023-06-07 ENCOUNTER — Telehealth (HOSPITAL_COMMUNITY): Payer: Self-pay | Admitting: *Deleted

## 2023-06-07 LAB — CULTURE, BETA STREP (GROUP B ONLY)

## 2023-06-07 NOTE — Telephone Encounter (Signed)
 Preadmission screen

## 2023-06-08 ENCOUNTER — Other Ambulatory Visit: Payer: Self-pay | Admitting: Advanced Practice Midwife

## 2023-06-08 DIAGNOSIS — O133 Gestational [pregnancy-induced] hypertension without significant proteinuria, third trimester: Secondary | ICD-10-CM

## 2023-06-09 ENCOUNTER — Inpatient Hospital Stay (HOSPITAL_COMMUNITY): Payer: Self-pay

## 2023-06-09 ENCOUNTER — Other Ambulatory Visit: Payer: Self-pay

## 2023-06-09 ENCOUNTER — Inpatient Hospital Stay (HOSPITAL_COMMUNITY): Payer: Self-pay | Admitting: Anesthesiology

## 2023-06-09 ENCOUNTER — Encounter (HOSPITAL_COMMUNITY): Payer: Self-pay | Admitting: Obstetrics and Gynecology

## 2023-06-09 ENCOUNTER — Inpatient Hospital Stay (HOSPITAL_COMMUNITY)
Admission: RE | Admit: 2023-06-09 | Discharge: 2023-06-12 | DRG: 807 | Disposition: A | Discharge status: Home / Self Care | Payer: Self-pay | Attending: Obstetrics and Gynecology | Admitting: Obstetrics and Gynecology

## 2023-06-09 DIAGNOSIS — Z3A37 37 weeks gestation of pregnancy: Secondary | ICD-10-CM | POA: Diagnosis not present

## 2023-06-09 DIAGNOSIS — F909 Attention-deficit hyperactivity disorder, unspecified type: Secondary | ICD-10-CM | POA: Diagnosis present

## 2023-06-09 DIAGNOSIS — Z833 Family history of diabetes mellitus: Secondary | ICD-10-CM | POA: Diagnosis not present

## 2023-06-09 DIAGNOSIS — O133 Gestational [pregnancy-induced] hypertension without significant proteinuria, third trimester: Secondary | ICD-10-CM

## 2023-06-09 DIAGNOSIS — O99214 Obesity complicating childbirth: Secondary | ICD-10-CM | POA: Diagnosis present

## 2023-06-09 DIAGNOSIS — O134 Gestational [pregnancy-induced] hypertension without significant proteinuria, complicating childbirth: Secondary | ICD-10-CM | POA: Diagnosis present

## 2023-06-09 DIAGNOSIS — D563 Thalassemia minor: Secondary | ICD-10-CM | POA: Diagnosis present

## 2023-06-09 DIAGNOSIS — Z8249 Family history of ischemic heart disease and other diseases of the circulatory system: Secondary | ICD-10-CM

## 2023-06-09 DIAGNOSIS — E66813 Obesity, class 3: Secondary | ICD-10-CM | POA: Diagnosis present

## 2023-06-09 DIAGNOSIS — Z148 Genetic carrier of other disease: Secondary | ICD-10-CM

## 2023-06-09 DIAGNOSIS — O0993 Supervision of high risk pregnancy, unspecified, third trimester: Secondary | ICD-10-CM

## 2023-06-09 DIAGNOSIS — O9902 Anemia complicating childbirth: Secondary | ICD-10-CM | POA: Diagnosis present

## 2023-06-09 DIAGNOSIS — Z886 Allergy status to analgesic agent status: Secondary | ICD-10-CM | POA: Diagnosis not present

## 2023-06-09 DIAGNOSIS — O139 Gestational [pregnancy-induced] hypertension without significant proteinuria, unspecified trimester: Secondary | ICD-10-CM | POA: Diagnosis present

## 2023-06-09 DIAGNOSIS — O99344 Other mental disorders complicating childbirth: Secondary | ICD-10-CM | POA: Diagnosis not present

## 2023-06-09 DIAGNOSIS — D691 Qualitative platelet defects: Secondary | ICD-10-CM | POA: Diagnosis present

## 2023-06-09 LAB — COMPREHENSIVE METABOLIC PANEL
ALT: 10 U/L (ref 0–44)
AST: 14 U/L — ABNORMAL LOW (ref 15–41)
Albumin: 2.7 g/dL — ABNORMAL LOW (ref 3.5–5.0)
Alkaline Phosphatase: 90 U/L (ref 38–126)
Anion gap: 8 (ref 5–15)
BUN: 9 mg/dL (ref 6–20)
CO2: 21 mmol/L — ABNORMAL LOW (ref 22–32)
Calcium: 8.6 mg/dL — ABNORMAL LOW (ref 8.9–10.3)
Chloride: 106 mmol/L (ref 98–111)
Creatinine, Ser: 0.73 mg/dL (ref 0.44–1.00)
GFR, Estimated: >60 mL/min (ref >60)
Glucose, Bld: 78 mg/dL (ref 70–99)
Potassium: 3.6 mmol/L (ref 3.5–5.1)
Sodium: 135 mmol/L (ref 135–145)
Total Bilirubin: 0.3 mg/dL (ref 0.0–1.2)
Total Protein: 6.7 g/dL (ref 6.5–8.1)

## 2023-06-09 LAB — CBC
HCT: 37 % (ref 36.0–46.0)
HCT: 34.8 % — ABNORMAL LOW (ref 36.0–46.0)
Hemoglobin: 12 g/dL (ref 12.0–15.0)
Hemoglobin: 11.4 g/dL — ABNORMAL LOW (ref 12.0–15.0)
MCH: 28.1 pg (ref 26.0–34.0)
MCH: 28.2 pg (ref 26.0–34.0)
MCHC: 32.4 g/dL (ref 30.0–36.0)
MCHC: 32.8 g/dL (ref 30.0–36.0)
MCV: 86.7 fL (ref 80.0–100.0)
MCV: 86.1 fL (ref 80.0–100.0)
Platelets: 220 10*3/uL (ref 150–400)
Platelets: 206 10*3/uL (ref 150–400)
RBC: 4.27 MIL/uL (ref 3.87–5.11)
RBC: 4.04 MIL/uL (ref 3.87–5.11)
RDW: 14.9 % (ref 11.5–15.5)
RDW: 14.8 % (ref 11.5–15.5)
WBC: 4.3 10*3/uL (ref 4.0–10.5)
WBC: 6 10*3/uL (ref 4.0–10.5)
nRBC: 0 % (ref 0.0–0.2)
nRBC: 0 % (ref 0.0–0.2)

## 2023-06-09 LAB — CULTURE, BETA STREP (GROUP B ONLY): Strep Gp B Culture: NEGATIVE

## 2023-06-09 LAB — PROTEIN / CREATININE RATIO, URINE
Creatinine, Urine: 239 mg/dL
Protein Creatinine Ratio: 0.1 mg/mg{creat} (ref 0.00–0.15)
Total Protein, Urine: 25 mg/dL

## 2023-06-09 LAB — RAPID HIV SCREEN (HIV 1/2 AB+AG)
HIV 1/2 Antibodies: NONREACTIVE
HIV-1 P24 Antigen - HIV24: NONREACTIVE

## 2023-06-09 LAB — HEPATITIS B SURFACE ANTIGEN: Hepatitis B Surface Ag: NONREACTIVE

## 2023-06-09 LAB — RPR: RPR Ser Ql: NONREACTIVE

## 2023-06-09 LAB — HIV ANTIBODY (ROUTINE TESTING W REFLEX): HIV Screen 4th Generation wRfx: NONREACTIVE

## 2023-06-09 LAB — TYPE AND SCREEN
ABO/RH(D): A POS
Antibody Screen: NEGATIVE

## 2023-06-09 LAB — HEMOGLOBIN A1C
Hgb A1c MFr Bld: 4.9 % (ref 4.8–5.6)
Mean Plasma Glucose: 93.93 mg/dL

## 2023-06-09 MED ORDER — OXYTOCIN BOLUS FROM INFUSION
333.0000 mL | Freq: Once | INTRAVENOUS | Status: AC
  Administered 2023-06-10: 333 mL via INTRAVENOUS

## 2023-06-09 MED ORDER — LACTATED RINGERS IV SOLN
500.0000 mL | INTRAVENOUS | Status: DC | PRN
  Administered 2023-06-10: 500 mL via INTRAVENOUS

## 2023-06-09 MED ORDER — LACTATED RINGERS IV SOLN
500.0000 mL | Freq: Once | INTRAVENOUS | Status: AC
  Administered 2023-06-09: 500 mL via INTRAVENOUS

## 2023-06-09 MED ORDER — LIDOCAINE HCL (PF) 1 % IJ SOLN: 30.0000 mL | INTRAMUSCULAR | Status: DC | PRN

## 2023-06-09 MED ORDER — OXYCODONE-ACETAMINOPHEN 5-325 MG PO TABS
2.0000 | ORAL_TABLET | ORAL | Status: DC | PRN
Start: 2023-06-09 — End: 2023-06-10

## 2023-06-09 MED ORDER — OXYTOCIN-SODIUM CHLORIDE 30-0.9 UT/500ML-% IV SOLN
1.0000 m[IU]/min | INTRAVENOUS | Status: DC
  Administered 2023-06-09: 2 m[IU]/min via INTRAVENOUS
  Filled 2023-06-09: qty 500

## 2023-06-09 MED ORDER — TERBUTALINE SULFATE 1 MG/ML IJ SOLN: 0.2500 mg | Freq: Once | INTRAMUSCULAR | Status: DC | PRN

## 2023-06-09 MED ORDER — SOD CITRATE-CITRIC ACID 500-334 MG/5ML PO SOLN
30.0000 mL | ORAL | Status: DC | PRN
Start: 2023-06-09 — End: 2023-06-10

## 2023-06-09 MED ORDER — DIPHENHYDRAMINE HCL 50 MG/ML IJ SOLN
12.5000 mg | INTRAMUSCULAR | Status: DC | PRN
  Administered 2023-06-09: 12.5 mg via INTRAVENOUS
  Filled 2023-06-09: qty 1

## 2023-06-09 MED ORDER — EPHEDRINE 5 MG/ML INJ: 10.0000 mg | INTRAVENOUS | Status: DC | PRN

## 2023-06-09 MED ORDER — PHENYLEPHRINE 80 MCG/ML (10ML) SYRINGE FOR IV PUSH (FOR BLOOD PRESSURE SUPPORT): 80.0000 ug | PREFILLED_SYRINGE | INTRAVENOUS | Status: DC | PRN

## 2023-06-09 MED ORDER — LACTATED RINGERS IV SOLN: 500.0000 mL | Freq: Once | INTRAVENOUS | Status: DC

## 2023-06-09 MED ORDER — ONDANSETRON HCL 4 MG/2ML IJ SOLN
4.0000 mg | Freq: Four times a day (QID) | INTRAMUSCULAR | Status: DC | PRN
  Administered 2023-06-09 – 2023-06-10 (×2): 4 mg via INTRAVENOUS
  Filled 2023-06-09 (×2): qty 2

## 2023-06-09 MED ORDER — OXYTOCIN-SODIUM CHLORIDE 30-0.9 UT/500ML-% IV SOLN
2.5000 [IU]/h | INTRAVENOUS | Status: DC
  Administered 2023-06-10: 2.5 [IU]/h via INTRAVENOUS
  Filled 2023-06-09: qty 500

## 2023-06-09 MED ORDER — LACTATED RINGERS IV SOLN
INTRAVENOUS | Status: DC
Start: 2023-06-09 — End: 2023-06-10

## 2023-06-09 MED ORDER — MISOPROSTOL 50MCG HALF TABLET: 50.0000 ug | ORAL_TABLET | Freq: Once | ORAL | Status: DC

## 2023-06-09 MED ORDER — MISOPROSTOL 25 MCG QUARTER TABLET
25.0000 ug | ORAL_TABLET | Freq: Once | ORAL | Status: DC
Start: 2023-06-09 — End: 2023-06-09

## 2023-06-09 MED ORDER — OXYCODONE-ACETAMINOPHEN 5-325 MG PO TABS
1.0000 | ORAL_TABLET | ORAL | Status: DC | PRN
Start: 2023-06-09 — End: 2023-06-10

## 2023-06-09 MED ORDER — LIDOCAINE HCL (PF) 1 % IJ SOLN
INTRAMUSCULAR | Status: DC | PRN
  Administered 2023-06-09: 4 mL via EPIDURAL
  Administered 2023-06-09: 5 mL via EPIDURAL

## 2023-06-09 MED ORDER — FENTANYL-BUPIVACAINE-NACL 0.5-0.125-0.9 MG/250ML-% EP SOLN
12.0000 mL/h | EPIDURAL | Status: DC | PRN
  Administered 2023-06-09: 12 mL/h via EPIDURAL
  Filled 2023-06-09: qty 250

## 2023-06-09 MED ORDER — FENTANYL CITRATE (PF) 100 MCG/2ML IJ SOLN
100.0000 ug | INTRAMUSCULAR | Status: DC | PRN
  Administered 2023-06-09 (×2): 100 ug via INTRAVENOUS
  Filled 2023-06-09 (×2): qty 2

## 2023-06-09 MED ORDER — ACETAMINOPHEN 325 MG PO TABS: 650.0000 mg | ORAL_TABLET | ORAL | Status: DC | PRN

## 2023-06-09 NOTE — Anesthesia Preprocedure Evaluation (Signed)
 Anesthesia Evaluation  Patient identified by MRN, date of birth, ID band Patient awake    Reviewed: Allergy & Precautions, NPO status , Patient's Chart, lab work & pertinent test results  History of Anesthesia Complications Negative for: history of anesthetic complications  Airway Mallampati: II   Neck ROM: Full    Dental   Pulmonary neg pulmonary ROS   Pulmonary exam normal        Cardiovascular hypertension, Normal cardiovascular exam     Neuro/Psych  PSYCHIATRIC DISORDERS Anxiety     negative neurological ROS     GI/Hepatic negative GI ROS, Neg liver ROS,,,  Endo/Other    Class 3 obesity  Renal/GU negative Renal ROS     Musculoskeletal negative musculoskeletal ROS (+)    Abdominal   Peds  (+) ADHD Hematology  (+) Blood dyscrasia, anemia  Plt 206k Unspecified bleeding d/o - can't take aspirin Alpha thalassemia silent carrier    Anesthesia Other Findings   Reproductive/Obstetrics (+) Pregnancy                             Anesthesia Physical Anesthesia Plan  ASA: 3  Anesthesia Plan: Epidural   Post-op Pain Management: Minimal or no pain anticipated   Induction:   PONV Risk Score and Plan: 2 and Treatment may vary due to age or medical condition  Airway Management Planned: Natural Airway  Additional Equipment: None  Intra-op Plan:   Post-operative Plan:   Informed Consent: I have reviewed the patients History and Physical, chart, labs and discussed the procedure including the risks, benefits and alternatives for the proposed anesthesia with the patient or authorized representative who has indicated his/her understanding and acceptance.       Plan Discussed with: Anesthesiologist  Anesthesia Plan Comments: (Labs reviewed. Platelets acceptable, patient not taking any blood thinning medications. Per RN, FHR tracing reported to be stable enough for sitting procedure.  Risks and benefits discussed with patient, including PDPH, backache, epidural hematoma, failed epidural, blood pressure changes, allergic reaction, and nerve injury. Patient expressed understanding and wished to proceed.)       Anesthesia Quick Evaluation

## 2023-06-09 NOTE — Progress Notes (Signed)
 Labor Progress Note Samantha Stanton is a 28 y.o. [phone] at [redacted]w[redacted]d presented for IOL 2/2 gHTN  S: Comfortable at bedside, no questions or concerns  O:  BP (!) 145/82   Pulse 63   Temp 98.2 F (36.8 C)   Resp 17   Ht 5' 4 (1.626 m)   Wt 114.3 kg   LMP 05/11/2022   SpO2 100%   BMI 43.24 kg/m  EFM: 160/mod/+a/-d  CVE: Dilation: 4 Effacement (%): 50 Cervical Position: Middle Station: -3 Presentation: Vertex Exam by:: Dr. Von   A&P: 28 y.o. [id number] [redacted]w[redacted]d here for IOL 2/2 gHTN #Labor: Progressing well. Discussed role of AROM with pt, pt provided verbal consent. Used BSUS to recheck position -- cephalic on BSUS. AROM performed with scant amount of clear/bloody fluid. Will cont to uptitrate pitocin  at this time. #Pain: Epidural #FWB: Cat I #GBS negative  #gHTN and hx pre-e: BP all mild to moderate range over past several hours  had one severe range, but was mild-range on recheck  PEC w/up neg  Alain Von, MD 10:54 PM

## 2023-06-09 NOTE — Anesthesia Procedure Notes (Signed)
 Epidural Patient location during procedure: OB Start time: 06/09/2023 8:50 PM End time: 06/09/2023 8:52 PM  Staffing Anesthesiologist: Lucious Debby BRAVO, MD Performed: anesthesiologist   Preanesthetic Checklist Completed: patient identified, IV checked, risks and benefits discussed, monitors and equipment checked, pre-op evaluation and timeout performed  Epidural Patient position: sitting Prep: DuraPrep Patient monitoring: continuous pulse ox and blood pressure Approach: midline Location: L3-L4 Injection technique: LOR saline  Needle:  Needle type: Tuohy  Needle gauge: 17 G Needle length: 9 cm Needle insertion depth: 7 cm Catheter size: 19 Gauge Catheter at skin depth: 12 cm Test dose: negative and Other (1% lidocaine )  Assessment Events: blood not aspirated and no cerebrospinal fluid  Additional Notes Patient identified. Risks including, but not limited to, bleeding, infection, nerve damage, paralysis, inadequate analgesia, blood pressure changes, nausea, vomiting, allergic reaction, postpartum back pain, itching, and headache were discussed. Patient expressed understanding and wished to proceed. Sterile prep and drape, including hand hygiene, mask, and sterile gloves were used. The patient was positioned and the spine was prepped. The skin was anesthetized with lidocaine . No paraesthesia or other complication noted. The patient did not experience any signs of intravascular injection such as tinnitus or metallic taste in mouth, nor signs of intrathecal spread such as rapid motor block. Please see nursing notes for vital signs. The patient tolerated the procedure well.   Debby Lucious, MDReason for block:procedure for pain

## 2023-06-09 NOTE — Progress Notes (Signed)
 Labor Progress Note Samantha Stanton is a 28 y.o. [id number] at [redacted]w[redacted]d presented for IOL in setting of GHTN. S: Feeling much better s/p epidural.   O:  BP (!) 156/89   Pulse 67   Temp 98 F (36.7 C) (Oral)   Resp 17   Ht 5' 4 (1.626 m)   Wt 114.3 kg   LMP 05/11/2022   SpO2 100%   BMI 43.24 kg/m  EFM: 150bpm/moderate variability /+accels 15x15, no decels, Category I  CVE: Dilation: 3 Effacement (%): Thick Cervical Position: Posterior Station: Ballotable Presentation: Vertex (confirmed by US  by Wyvonna Lemmings RNC) Exam by:: Lauraine Norse, DO   A&P: 28 y.o. [id number] [redacted]w[redacted]d IUP, intact #Labor: Progressing well. Foley in place. Anticipate AROM when FB is out. #Pain: Well-controlled s/p epidural.  #FWB: FHT Category I #GBS negative Reviewed plan of care with patient and partner. Anticipate SVD.  Samantha Stanton, Medical Student 9:47 PM    Attestation of Supervision of Student:  I confirm that I have verified the information documented in the medical student's note and that I have also personally reperformed the history, physical exam and all medical decision making activities.  I have verified that all services and findings are accurately documented in this student's note; and I agree with management and plan as outlined in the documentation. I have also made any necessary editorial changes.   Alain Sor, MD [facility], [facility] 06/10/2023 5:32 AM

## 2023-06-09 NOTE — Progress Notes (Signed)
 Samantha Stanton is a 28 y.o. [id number] at [redacted]w[redacted]d.  Subjective: More uncomfortable w/ contractions. Wants to walk.   Objective: BP (!) 152/88   Pulse 76   Temp 98.6 F (37 C) (Oral)   Resp 18   Ht 5' 4 (1.626 m)   Wt 114.3 kg   LMP 05/11/2022   SpO2 98%   BMI 43.24 kg/m    FHT:  FHR: 140 bpm, variability: mod,  accelerations:  15x15,  decelerations:  none UC:  irreg, mild-mod Dilation: 1.5 Effacement (%): Thick Cervical Position: Posterior Station: Ballotable Presentation: Vertex (confirmed by US  by Samantha Stanton RNC) Exam by:: Samantha Stanton  Samantha Stanton CNM Foley balloon placed without difficulty  Labs: Results for orders placed or performed during the hospital encounter of 06/09/23 (from the past 24 hours)  Type and screen     Status: None   Collection Time: 06/09/23  8:48 AM  Result Value Ref Range   ABO/RH(D) A POS    Antibody Screen NEG    Sample Expiration      06/12/2023,2359 Performed at [facility], [address], [place], [place] [address]   CBC     Status: None   Collection Time: 06/09/23  8:49 AM  Result Value Ref Range   WBC 4.3 4.0 - 10.5 K/uL   RBC 4.27 3.87 - 5.11 MIL/uL   Hemoglobin 12.0 12.0 - 15.0 g/dL   HCT 62.9 63.9 - 53.9 %   MCV 86.7 80.0 - 100.0 fL   MCH 28.1 26.0 - 34.0 pg   MCHC 32.4 30.0 - 36.0 g/dL   RDW 85.0 88.4 - 84.4 %   Platelets 220 150 - 400 K/uL   nRBC 0.0 0.0 - 0.2 %  RPR     Status: None   Collection Time: 06/09/23  8:49 AM  Result Value Ref Range   RPR Ser Ql NON REACTIVE NON REACTIVE  Protein / creatinine ratio, urine     Status: None   Collection Time: 06/09/23  8:49 AM  Result Value Ref Range   Creatinine, Urine 239 mg/dL   Total Protein, Urine 25 mg/dL   Protein Creatinine Ratio 0.10 0.00 - 0.15 mg/mg[Cre]  Comprehensive metabolic panel     Status: Abnormal   Collection Time: 06/09/23  8:49 AM  Result Value Ref Range   Sodium 135 135 - 145 mmol/L   Potassium 3.6 3.5 - 5.1 mmol/L   Chloride 106 98 - 111 mmol/L   CO2  21 (L) 22 - 32 mmol/L   Glucose, Bld 78 70 - 99 mg/dL   BUN 9 6 - 20 mg/dL   Creatinine, Ser 9.26 0.44 - 1.00 mg/dL   Calcium 8.6 (L) 8.9 - 10.3 mg/dL   Total Protein 6.7 6.5 - 8.1 g/dL   Albumin 2.7 (L) 3.5 - 5.0 g/dL   AST 14 (L) 15 - 41 U/L   ALT 10 0 - 44 U/L   Alkaline Phosphatase 90 38 - 126 U/L   Total Bilirubin 0.3 0.0 - 1.2 mg/dL   GFR, Estimated >39 >39 mL/min   Anion gap 8 5 - 15  Hepatitis B surface antigen     Status: None   Collection Time: 06/09/23 10:51 AM  Result Value Ref Range   Hepatitis B Surface Ag NON REACTIVE NON REACTIVE  Rapid HIV screen (HIV 1/2 Ab+Ag)     Status: None   Collection Time: 06/09/23 10:51 AM  Result Value Ref Range   HIV-1 P24 Antigen - HIV24 NON REACTIVE  NON REACTIVE   HIV 1/2 Antibodies NON REACTIVE NON REACTIVE   Interpretation (HIV Ag Ab)      A non reactive test result means that HIV 1 or HIV 2 antibodies and HIV 1 p24 antigen were not detected in the specimen.  Hemoglobin A1c     Status: None   Collection Time: 06/09/23 10:51 AM  Result Value Ref Range   Hgb A1c MFr Bld 4.9 4.8 - 5.6 %   Mean Plasma Glucose 93.93 mg/dL  HIV Antibody (routine testing w rflx)     Status: None   Collection Time: 06/09/23 10:51 AM  Result Value Ref Range   HIV Screen 4th Generation wRfx Non Reactive Non Reactive    Assessment / Plan: [redacted]w[redacted]d week IUP, intact Labor: Early/IOL. Internal os less dilated than previously felt to be. Foley now in place. Will continue low-dose pitocin . Fetal Wellbeing:  Category I Pain Control:  comfort meaures Anticipated MOD:  SVD  Samantha Stanton Samantha Stanton , CNM 06/09/2023 3:31 PM

## 2023-06-09 NOTE — H&P (Addendum)
 OBSTETRIC ADMISSION HISTORY AND PHYSICAL  Samantha Stanton is a 28 y.o. female [phone] with IUP at [redacted]w[redacted]d by midtrimester ultrasound presenting for IOL for GHTN. She reports +FMs, No LOF, no VB, no blurry vision, headaches or peripheral edema, and RUQ pain.  She plans on breast feeding. She wanted to get a BTL for birth control, but states she did not sign papers in time. Reviewed LARC's, inlcuding nexplanon, paragard, and mirena. Patient states she wants to consider her options before deciding. She received her prenatal care at  [facility], but transferred care recently from [facility]. Prenatal records have been requested, but not yet received.    Dating: By ultrasound --->  Estimated Date of Delivery: 06/30/23  Sono:    @[redacted]w[redacted]d , CWD, normal anatomy, breech presentation, left lateral placenta lie, EFW not calculated at that time. Fetus was cephalic today confirmed by bedside ultrasound by RN. Fetus vertex by SVE.   Prenatal History/Complications:   NURSING  PROVIDER  [facility] Dating by US   [facility] Model Traditional Anatomy U/S   Initiated care at                  Language  English              LAB RESULTS   Support Person FOB--Dekarius  Genetics NIPS:  AFP:     NT/IT (FT only)     Carrier Screen Horizon:   Rhogam  --/--/A POS (01/12 0848) A1C/GTT Early HgbA1C:  Third trimester 2 hr GTT:   Flu Vaccine     TDaP Vaccine   Blood Type @ABORHRESULTS   RSV Vaccine  Antibody NEG (01/12 0848)  COVID Vaccine  Rubella    Feeding Plan Breastfeeding RPR    Contraception Unsure of method (potentially BTL) HBsAg    Circumcision N/a, girl HIV    Pediatrician  List given HCVAb    Prenatal Classes     BTL Consent  Pap Diagnosis  Date Value Ref Range Status  04/07/2021   Final   - Negative for intraepithelial lesion or malignancy (NILM)    BTL Pre-payment  GC/CT Initial:   36wks:    VBAC Consent  GBS Negative/-- (01/08 1207) For PCN allergy, check  sensitivities   BRx Optimized? 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  Consider MFM consult.   Alpha thalassemia silent carrier 05/10/2021   Anxiety    Bleeding disorder (HCC)    Cannot take ASA   Chlamydia infection affecting pregnancy 2013   Hypertension    Gestational HTN   Menorrhagia 11/11/2018   Pregnancy induced hypertension    with last pregnancy    Past Surgical History: Past Surgical History:  Procedure Laterality Date   DILATION AND CURETTAGE OF UTERUS     DILATION AND EVACUATION N/A 06/08/2021   Procedure: DILATATION AND EVACUATION;  Surgeon: Eveline Lynwood MATSU, MD;  Location: [facility] LD ORS;  Service: Gynecology;  Laterality: N/A;   WISDOM  TOOTH EXTRACTION      Obstetrical History: OB History     Gravida  9   Para  4   Term  3   Preterm  1   AB  4   Living  3      SAB  3   IAB  1   Ectopic      Multiple  0   Live Births  3           Social History Social History   Socioeconomic History   Marital status: Single    Spouse name: Not on file   Number of children: Not on file   Years of education: Not on file   Highest education level: Not on file  Occupational History   Occupation: Member Advocate    Comment: [employer]  Tobacco Use   Smoking status: Never   Smokeless tobacco: Never  Vaping Use   Vaping status: Never Used  Substance and Sexual Activity   Alcohol use: Not Currently   Drug use: No   Sexual activity: Not Currently    Birth control/protection: None  Other Topics Concern   Not on file  Social History Narrative   Not on file   Social Drivers of Health    Financial Resource Strain: Not on file  Food Insecurity: Food Insecurity Present (06/09/2023)   Hunger Vital Sign    Worried About Running Out of Food in the Last Year: Sometimes true    Ran Out of Food in the Last Year: Sometimes true  Transportation Needs: No Transportation Needs (06/09/2023)   PRAPARE - Administrator, Civil Service (Medical): No    Lack of Transportation (Non-Medical): No  Physical Activity: Not on file  Stress: Not on file  Social Connections: Not on file    Family History: Family History  Problem Relation Age of Onset   Diabetes Mother    Hypertension Mother    Cancer Sister        Leukemia    Allergies: Allergies  Allergen Reactions   Aspirin Other (See Comments)    Causes nose bleeds and frequent periods - age 74. Saw hematologist and told not to take ASA    Medications Prior to Admission  Medication Sig Dispense Refill Last Dose/Taking   metroNIDAZOLE  (FLAGYL ) 500 MG tablet Take 1 tablet (500 mg total) by mouth 2 (two) times daily for 7 days. 14 tablet 0    Multiple Vitamins-Minerals (HAIR SKIN AND NAILS FORMULA PO) Take 2 tablets by mouth daily. (Patient not taking: Reported on 06/05/2023)      Prenatal Vit-Fe Fumarate-FA (MULTIVITAMIN-PRENATAL) 27-0.8 MG TABS tablet Take 1 tablet by mouth daily at 12 noon.      Probiotic Product (PROBIOTIC DAILY PO) Take 2 tablets by mouth daily. (Patient not taking: Reported on 06/05/2023)        Review of Systems   All systems reviewed and negative except as stated in HPI  Blood pressure (!) 145/86, pulse 76, temperature 98.6 F (37 C), temperature source Oral, resp. rate 18, height 5' 4 (1.626 m), weight 114.3 kg, last menstrual period 05/11/2022, SpO2 98%, unknown if currently breastfeeding. General appearance: alert, cooperative, and appears stated age Lungs: clear to auscultation bilaterally Heart: regular rate and rhythm Abdomen: soft, non-tender; bowel sounds normal Pelvic: normal  genitalia Extremities: Homans sign is negative, no sign of DVT DTR's normal Presentation: cephalic Fetal monitoringBaseline: 150 bpm, Variability: Good {> 6 bpm), Accelerations: Reactive, and Decelerations: Absent Uterine activity: occasional  ctx Dilation: 2 Effacement (%): Thick Station: Ballotable Exam by:: Levie Owensby  Claudene CNM   Prenatal labs: ABO, Rh: --/--/A POS (01/12 0848) Antibody: NEG (01/12 0848) Rubella:   RPR:    HBsAg:    HIV:    GBS: Negative/-- (01/08 1207)  1 hr Glucola no records Genetic screening  no records Anatomy US  no records   Prenatal Transfer Tool  Maternal Diabetes: No Genetic Screening: no records Maternal Ultrasounds/Referrals: Normal Fetal Ultrasounds or other Referrals:  None Maternal Substance Abuse:  No Significant Maternal Medications:  None Significant Maternal Lab Results: Group B Strep negative Number of Prenatal Visits:greater than 3 verified prenatal visits. received prenatal care at [facility], records requested. Moved back to [place] about a month ago. Maternal Vaccinations: Immunization History  Administered Date(s) Administered   DTaP 08/27/2000   Dtap, Unspecified 10/29/1996, 12/22/1996, 03/01/1997, 11/23/1997   HIB, Unspecified 10/29/1996, 12/22/1996, 03/01/1997, 11/23/1997   HPV 9-valent 03/04/2014   HPV Quadrivalent 07/26/2008, 12/14/2010   Hep B, Unspecified Jun 10, 1996, 09/29/1996, 03/01/1997   Hepatitis A 12/14/2010   Hepatitis A, Adult 12/14/2010   Hepatitis A, Ped/Adol-2 Dose 07/26/2008   IPV 08/27/2000   MMR 08/23/1997, 08/27/2000   Meningococcal Conjugate 07/26/2008, 03/04/2014   Polio, Unspecified 10/29/1996, 12/22/1996, 08/23/1997   Tdap 01/19/2008, 07/21/2013, 10/03/2018, 10/30/2019   Varicella 08/23/1997, 07/26/2008    Other Comments:  None    Results for orders placed or performed during the hospital encounter of 06/09/23 (from the past 24 hours)  Type and screen   Collection Time: 06/09/23   8:48 AM  Result Value Ref Range   ABO/RH(D) A POS    Antibody Screen NEG    Sample Expiration      06/12/2023,2359 Performed at [facility], [address], [place], [place] [address]   CBC   Collection Time: 06/09/23  8:49 AM  Result Value Ref Range   WBC 4.3 4.0 - 10.5 K/uL   RBC 4.27 3.87 - 5.11 MIL/uL   Hemoglobin 12.0 12.0 - 15.0 g/dL   HCT 62.9 63.9 - 53.9 %   MCV 86.7 80.0 - 100.0 fL   MCH 28.1 26.0 - 34.0 pg   MCHC 32.4 30.0 - 36.0 g/dL   RDW 85.0 88.4 - 84.4 %   Platelets 220 150 - 400 K/uL   nRBC 0.0 0.0 - 0.2 %  Protein / creatinine ratio, urine   Collection Time: 06/09/23  8:49 AM  Result Value Ref Range   Creatinine, Urine 239 mg/dL   Total Protein, Urine 25 mg/dL   Protein Creatinine Ratio 0.10 0.00 - 0.15 mg/mg[Cre]  Comprehensive metabolic panel   Collection Time: 06/09/23  8:49 AM  Result Value Ref Range   Sodium 135 135 - 145 mmol/L   Potassium 3.6 3.5 - 5.1 mmol/L   Chloride 106 98 - 111 mmol/L   CO2 21 (L) 22 - 32 mmol/L   Glucose, Bld 78 70 - 99 mg/dL   BUN 9 6 - 20 mg/dL   Creatinine, Ser 9.26 0.44 - 1.00 mg/dL   Calcium 8.6 (L) 8.9 - 10.3 mg/dL   Total Protein 6.7 6.5 - 8.1 g/dL   Albumin 2.7 (L) 3.5 - 5.0 g/dL   AST 14 (L) 15 - 41 U/L   ALT 10 0 - 44 U/L   Alkaline Phosphatase 90 38 - 126 U/L   Total Bilirubin 0.3 0.0 - 1.2 mg/dL   GFR, Estimated >39 >39 mL/min   Anion gap 8 5 - 15  Patient Active Problem List   Diagnosis Date Noted   Gestational hypertension 06/09/2023   Breech presentation 06/03/2023   Alpha thalassemia silent carrier 05/10/2021   Supervision of high risk pregnancy in third trimester 03/21/2021   ADHD (attention deficit hyperactivity disorder) 11/11/2018   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 [id number] at [redacted]w[redacted]d here for IOL for GHTN. Dual cytotec  was given, plan to start pitocin . Prenatal labs and HGBA1C ordered and drawn.    #Labor:Pitocin  #Pain: Epidural #FWB: Category I #ID:  GBS negative #MOF: breast #MOC:undecided, desires permanent sterilization but did not sign forms #Circ:  N/a  Joesph KATHEE Gouge, Student-MidWife  06/09/2023, 11:01 AM  I was present for the exam and agree with above. No evidence of Pre-E. Will CTO closely.   Claudene Puanani Gene , CNM 06/09/2023 4:51 PM

## 2023-06-10 ENCOUNTER — Encounter (HOSPITAL_COMMUNITY): Payer: Self-pay | Admitting: Obstetrics and Gynecology

## 2023-06-10 DIAGNOSIS — Z3A37 37 weeks gestation of pregnancy: Secondary | ICD-10-CM

## 2023-06-10 DIAGNOSIS — O99344 Other mental disorders complicating childbirth: Secondary | ICD-10-CM

## 2023-06-10 DIAGNOSIS — O134 Gestational [pregnancy-induced] hypertension without significant proteinuria, complicating childbirth: Secondary | ICD-10-CM

## 2023-06-10 LAB — CBC
HCT: 37.9 % (ref 36.0–46.0)
Hemoglobin: 12.3 g/dL (ref 12.0–15.0)
MCH: 28.2 pg (ref 26.0–34.0)
MCHC: 32.5 g/dL (ref 30.0–36.0)
MCV: 86.9 fL (ref 80.0–100.0)
Platelets: 213 10*3/uL (ref 150–400)
RBC: 4.36 MIL/uL (ref 3.87–5.11)
RDW: 14.8 % (ref 11.5–15.5)
WBC: 7.4 10*3/uL (ref 4.0–10.5)
nRBC: 0 % (ref 0.0–0.2)

## 2023-06-10 LAB — RUBELLA SCREEN: Rubella: 3.16 {index} (ref >0.99)

## 2023-06-10 MED ORDER — NIFEDIPINE ER OSMOTIC RELEASE 30 MG PO TB24
30.0000 mg | ORAL_TABLET | Freq: Every day | ORAL | Status: DC
  Administered 2023-06-11 – 2023-06-12 (×2): 30 mg via ORAL
  Filled 2023-06-10 (×2): qty 1

## 2023-06-10 MED ORDER — ACETAMINOPHEN 500 MG PO TABS
1000.0000 mg | ORAL_TABLET | Freq: Three times a day (TID) | ORAL | Status: DC
  Administered 2023-06-10 – 2023-06-12 (×7): 1000 mg via ORAL
  Filled 2023-06-10 (×7): qty 2

## 2023-06-10 MED ORDER — ONDANSETRON HCL 4 MG PO TABS: 4.0000 mg | ORAL_TABLET | ORAL | Status: DC | PRN

## 2023-06-10 MED ORDER — MEDROXYPROGESTERONE ACETATE 150 MG/ML IM SUSP: 150.0000 mg | INTRAMUSCULAR | Status: DC | PRN

## 2023-06-10 MED ORDER — BENZOCAINE-MENTHOL 20-0.5 % EX AERO: 1.0000 | INHALATION_SPRAY | CUTANEOUS | Status: DC | PRN

## 2023-06-10 MED ORDER — SIMETHICONE 80 MG PO CHEW: 80.0000 mg | CHEWABLE_TABLET | ORAL | Status: DC | PRN

## 2023-06-10 MED ORDER — ONDANSETRON HCL 4 MG/2ML IJ SOLN: 4.0000 mg | INTRAMUSCULAR | Status: DC | PRN

## 2023-06-10 MED ORDER — ZOLPIDEM TARTRATE 5 MG PO TABS: 5.0000 mg | ORAL_TABLET | Freq: Every evening | ORAL | Status: DC | PRN

## 2023-06-10 MED ORDER — FUROSEMIDE 20 MG PO TABS
40.0000 mg | ORAL_TABLET | Freq: Every day | ORAL | Status: DC
  Administered 2023-06-11 – 2023-06-12 (×2): 40 mg via ORAL
  Filled 2023-06-10 (×2): qty 2

## 2023-06-10 MED ORDER — COCONUT OIL OIL: 1.0000 | TOPICAL_OIL | Status: DC | PRN

## 2023-06-10 MED ORDER — IBUPROFEN 800 MG PO TABS
800.0000 mg | ORAL_TABLET | Freq: Three times a day (TID) | ORAL | Status: DC
  Administered 2023-06-10 – 2023-06-12 (×7): 800 mg via ORAL
  Filled 2023-06-10 (×7): qty 1

## 2023-06-10 MED ORDER — PRENATAL MULTIVITAMIN CH
1.0000 | ORAL_TABLET | Freq: Every day | ORAL | Status: DC
  Administered 2023-06-10 – 2023-06-12 (×3): 1 via ORAL
  Filled 2023-06-10 (×3): qty 1

## 2023-06-10 MED ORDER — DIPHENHYDRAMINE HCL 25 MG PO CAPS: 25.0000 mg | ORAL_CAPSULE | Freq: Four times a day (QID) | ORAL | Status: DC | PRN

## 2023-06-10 MED ORDER — DIBUCAINE (PERIANAL) 1 % EX OINT: 1.0000 | TOPICAL_OINTMENT | CUTANEOUS | Status: DC | PRN

## 2023-06-10 MED ORDER — METRONIDAZOLE 500 MG PO TABS
500.0000 mg | ORAL_TABLET | Freq: Two times a day (BID) | ORAL | Status: DC
Start: 2023-06-10 — End: 2023-06-12
  Administered 2023-06-10 – 2023-06-12 (×5): 500 mg via ORAL
  Filled 2023-06-10 (×7): qty 1

## 2023-06-10 MED ORDER — OXYCODONE HCL 5 MG PO TABS: 5.0000 mg | ORAL_TABLET | Freq: Four times a day (QID) | ORAL | Status: DC | PRN

## 2023-06-10 MED ORDER — SENNOSIDES-DOCUSATE SODIUM 8.6-50 MG PO TABS
2.0000 | ORAL_TABLET | Freq: Every day | ORAL | Status: DC
  Administered 2023-06-11 – 2023-06-12 (×2): 2 via ORAL
  Filled 2023-06-10 (×2): qty 2

## 2023-06-10 MED ORDER — OXYCODONE HCL 5 MG PO TABS: 10.0000 mg | ORAL_TABLET | Freq: Four times a day (QID) | ORAL | Status: DC | PRN

## 2023-06-10 MED ORDER — WITCH HAZEL-GLYCERIN EX PADS: 1.0000 | MEDICATED_PAD | CUTANEOUS | Status: DC | PRN

## 2023-06-10 NOTE — Lactation Note (Signed)
 This note was copied from a baby's chart. Lactation Consultation Note  Patient Name: Samantha Stanton Unijb'd Date: 06/10/2023 Age:27 hours  Reason for consult: Initial assessment;Breastfeeding assistance;Early term 37-38.6wks;Infant < 6lbs  P4, [redacted]w[redacted]d, 2430g, ETI  Initial LC visit to see P4 mother of ETI. RN has set up the DEBP and mother was able to express 1 ml. Baby was fed mother's colostrum by syringe and finger feeding. Baby demonstrated a strong coordinated suck and mother was able to latch baby to breast. Infant rhythmically feeding and swallows noted. Mother said she has been leaking from the left breast. Informed mother that when baby has a good feeding, then volume is estimated to be 5 ml.   Discussed Early term, low birth weight infant feeding behavior.  breastfeed with feeding cues and when baby is actively sucking/ feeding at breast. Stop when baby shows fatigue. Limit breastfeeding to 10-15 minutes.  Supplement with mother's expressed milk and/or formula. Refer to feeding guidelines based on age/ weight left at bedside Pump for 15 min every 3 hours in initiation phase to stimulate milk production  Keep total infant feedings to 30 minutes to decrease infant fatigue. Request assistance as needed  Mom made aware of O/P services, breastfeeding support groups, and our phone # for post-discharge questions.     Maternal Data Has patient been taught Hand Expression?: No Does the patient have breastfeeding experience prior to this delivery?: Yes How long did the patient breastfeed?: 1-2 years  Feeding Mother's Current Feeding Choice: Breast Milk and Formula Nipple Type: Slow - flow  LATCH Score Latch: Grasps breast easily, tongue down, lips flanged, rhythmical sucking.  Audible Swallowing: A few with stimulation  Type of Nipple: Everted at rest and after stimulation  Comfort (Breast/Nipple): Soft / non-tender  Hold (Positioning): Assistance needed to correctly position  infant at breast and maintain latch.  LATCH Score: 8   Lactation Tools Discussed/Used Tools: Flanges;Pump Flange Size: 21;24 Breast pump type: Double-Electric Breast Pump Pump Education: Setup, frequency, and cleaning;Milk Storage Reason for Pumping: low birthweight infant Pumping frequency: every 3 hours for 15 minutes Pumped volume: 1 mL  Interventions Interventions: Breast feeding basics reviewed;Assisted with latch;Breast compression;Support pillows;Adjust position;Position options;DEBP;Education;LC Services brochure;LPT handout/interventions     Consult Status Consult Status: Follow-up Date: 06/11/23 Follow-up type: In-patient    Joshua Line M 06/10/2023, 3:23 PM

## 2023-06-10 NOTE — Anesthesia Postprocedure Evaluation (Signed)
 Anesthesia Post Note  Patient: Samantha Stanton  Procedure(s) Performed: AN AD HOC LABOR EPIDURAL     Patient location during evaluation: Mother Baby Anesthesia Type: Epidural Level of consciousness: awake and alert Pain management: pain level controlled Vital Signs Assessment: post-procedure vital signs reviewed and stable Respiratory status: spontaneous breathing, nonlabored ventilation and respiratory function stable Cardiovascular status: stable Postop Assessment: no headache, no backache and epidural receding Anesthetic complications: no   No notable events documented.  Last Vitals:  Vitals:   06/10/23 1230 06/10/23 1349  BP: 139/78 131/79  Pulse: 81 79  Resp: 18   Temp: 37.3 C   SpO2:      Last Pain:  Vitals:   06/10/23 1354  TempSrc:   PainSc: 0-No pain   Pain Goal:                   Burnice Vassel

## 2023-06-10 NOTE — Progress Notes (Signed)
 Labor Progress Note Samantha Stanton is a 28 y.o. [phone] at [redacted]w[redacted]d presented for IOL 2/2 gHTN  S: Not feeling her ctx, comfortable w epidural.  O:  BP 132/65   Pulse 64   Temp 98.2 F (36.8 C) (Oral)   Resp 17   Ht 5' 4 (1.626 m)   Wt 114.3 kg   LMP 05/11/2022   SpO2 100%   BMI 43.24 kg/m  EFM: 160/mod/+a/-d  CVE: Dilation: 5 Effacement (%): 50 Cervical Position: Middle Station: -3 Presentation: Vertex Exam by:: E Chipps RN   A&P: 28 y.o. [id number] [redacted]w[redacted]d here for IOL 2/2 gHTN #Labor: Cx w minimal change. IUPC placed to better assess ctx pattern.  #Pain: Epidural #FWB: Cat I #GBS negative  #gHTN and hx pre-e: BP all mild to moderate range over past several hours  had one severe range, but was mild-range on recheck  PEC w/up neg  Alain Sor, MD 5:29 AM

## 2023-06-10 NOTE — Discharge Summary (Signed)
 Postpartum Discharge Summary     Patient Name: Samantha Stanton DOB: [birth year] MRN: [id number]  Date of admission: 06/09/2023 Delivery date:06/10/2023 Delivering provider: Maud Sorenson Date of discharge: 06/12/2023  Admitting diagnosis: Gestational hypertension [O13.9] Intrauterine pregnancy: [redacted]w[redacted]d     Secondary diagnosis:  Principal Problem:   SVD (spontaneous vaginal delivery) Active Problems:   ADHD (attention deficit hyperactivity disorder)   Abnormal platelet aggregation (HCC)   Supervision of high risk pregnancy in third trimester   Alpha thalassemia silent carrier   Gestational hypertension  Additional problems:     Discharge diagnosis: Term Pregnancy Delivered and Gestational Hypertension                                              Post partum procedures: none Augmentation: AROM, Pitocin , and IP Foley Complications: None  Hospital course: Induction of Labor With Vaginal Delivery   28 y.o. yo [phone] at [redacted]w[redacted]d was admitted to the hospital 06/09/2023 for induction of labor.  Indication for induction: Gestational hypertension.  Patient had an labor course complicated by precipitous delivery. Membrane Rupture Time/Date: 10:23 PM,06/09/2023  Delivery Method:Vaginal, Spontaneous Operative Delivery:N/A Episiotomy: None Lacerations:  None Details of delivery can be found in separate delivery note.  Patient had a postpartum course complicated by gestational hypertension, she was started on Nifed 30 XL and lasix  with good control prior to discharge. She was undecided about contraception, is interested in interval tubal vs Paragard. Patient is discharged home 06/12/23.  Newborn Data: Birth date:06/10/2023 Birth time:8:54 AM Gender:Female Living status:Living Apgars:9 ,9  Weight:2430 g  Magnesium  Sulfate received: No BMZ received: No Rhophylac:No MMR:No T-DaP:Given prenatally Flu: No RSV Vaccine received: No Transfusion:No  Immunizations received: Immunization  History  Administered Date(s) Administered   DTaP 08/27/2000   Dtap, Unspecified 10/29/1996, 12/22/1996, 03/01/1997, 11/23/1997   HIB, Unspecified 10/29/1996, 12/22/1996, 03/01/1997, 11/23/1997   HPV 9-valent 03/04/2014   HPV Quadrivalent 07/26/2008, 12/14/2010   Hep B, Unspecified 13-Jul-1996, 09/29/1996, 03/01/1997   Hepatitis A 12/14/2010   Hepatitis A, Adult 12/14/2010   Hepatitis A, Ped/Adol-2 Dose 07/26/2008   IPV 08/27/2000   MMR 08/23/1997, 08/27/2000   Meningococcal Conjugate 07/26/2008, 03/04/2014   Polio, Unspecified 10/29/1996, 12/22/1996, 08/23/1997   Tdap 01/19/2008, 07/21/2013, 10/03/2018, 10/30/2019   Varicella 08/23/1997, 07/26/2008    Physical exam  Vitals:   06/11/23 0531 06/11/23 1600 06/11/23 2030 06/12/23 0616  BP: (!) 151/92 (!) 132/90 138/87 127/68  Pulse: 66 79 76 77  Resp: 17 18 18 17   Temp: 98.2 F (36.8 C) 98.4 F (36.9 C) 98.7 F (37.1 C) 97.9 F (36.6 C)  TempSrc: Oral Oral Oral Oral  SpO2: 99%  99% 99%  Weight:      Height:       General: alert, cooperative, and no distress Lochia: appropriate Uterine Fundus: firm Incision: N/A DVT Evaluation: No evidence of DVT seen on physical exam. No significant calf/ankle edema. Labs: Lab Results  Component Value Date   WBC 7.8 06/11/2023   HGB 11.1 (L) 06/11/2023   HCT 34.4 (L) 06/11/2023   MCV 87.3 06/11/2023   PLT 194 06/11/2023      Latest Ref Rng & Units 06/11/2023    6:24 AM  CMP  Glucose 70 - 99 mg/dL 80   BUN 6 - 20 mg/dL 5   Creatinine 4.33 - 2.95 mg/dL 1.88   Sodium 416 - 606 mmol/L 138  Potassium 3.5 - 5.1 mmol/L 4.1   Chloride 98 - 111 mmol/L 108   CO2 22 - 32 mmol/L 22   Calcium 8.9 - 10.3 mg/dL 9.2   Total Protein 6.5 - 8.1 g/dL 5.4   Total Bilirubin 0.0 - 1.2 mg/dL 0.2   Alkaline Phos 38 - 126 U/L 77   AST 15 - 41 U/L 14   ALT 0 - 44 U/L 8    Edinburgh Score:    06/10/2023   12:30 PM  Edinburgh Postnatal Depression Scale Screening Tool  I have been able to laugh  and see the funny side of things. 0  I have looked forward with enjoyment to things. 0  I have blamed myself unnecessarily when things went wrong. 2  I have been anxious or worried for no good reason. 2  I have felt scared or panicky for no good reason. 2  Things have been getting on top of me. 1  I have been so unhappy that I have had difficulty sleeping. 0  I have felt sad or miserable. 1  I have been so unhappy that I have been crying. 1  The thought of harming myself has occurred to me. 0  Edinburgh Postnatal Depression Scale Total 9   No data recorded  After visit meds:  Allergies as of 06/12/2023       Reactions   Aspirin Other (See Comments)   Causes nose bleeds and frequent periods - age 28. Saw hematologist and told not to take ASA        Medication List     STOP taking these medications    HAIR SKIN AND NAILS FORMULA PO   metroNIDAZOLE  500 MG tablet Commonly known as: Flagyl    PROBIOTIC DAILY PO       TAKE these medications    acetaminophen  500 MG tablet Commonly known as: TYLENOL  Take 2 tablets (1,000 mg total) by mouth every 8 (eight) hours.   furosemide  40 MG tablet Commonly known as: LASIX  Take 1 tablet (40 mg total) by mouth daily. Start taking on: June 13, 2023   ibuprofen  800 MG tablet Commonly known as: ADVIL  Take 1 tablet (800 mg total) by mouth every 8 (eight) hours.   multivitamin-prenatal 27-0.8 MG Tabs tablet Take 1 tablet by mouth daily at 12 noon.   NIFEdipine  30 MG 24 hr tablet Commonly known as: ADALAT  CC Take 1 tablet (30 mg total) by mouth daily. Start taking on: June 13, 2023   polyethylene glycol powder 17 GM/SCOOP powder Commonly known as: GLYCOLAX /MIRALAX  Take 17 g by mouth daily as needed.         Discharge home in stable condition Infant Feeding: Breast Infant Disposition:home with mother Discharge instruction: per After Visit Summary and Postpartum booklet. Activity: Advance as tolerated. Pelvic rest  for 6 weeks.  Diet: routine diet Future Appointments: Future Appointments  Date Time Provider Department Center  07/23/2023 10:35 AM Curlie Doughty [facility]  09/11/2023  9:20 AM Abraham Abo, MD [facility] None   Follow up Visit:  Message sent to [facility] 1/13  Please schedule this patient for a In person postpartum visit in 6 weeks with the following provider: Any provider. Additional Postpartum F/U:BP check 1 week  Low risk pregnancy complicated by: HTN Delivery mode:  Vaginal, Spontaneous Anticipated Birth Control:  Unsure, considering interval tubal vs Paragard   06/12/2023 Teena Feast, MD

## 2023-06-10 NOTE — Progress Notes (Signed)
 Samantha Stanton is a 28 y.o. [id number] at [redacted]w[redacted]d by ultrasound admitted for induction of labor due to [facility]  Subjective: Patient resting with epidural. Declining feeling pressure. Reports she was having some nausea that subsided with medicine.  Objective: BP 117/88   Pulse 93   Temp 98.8 F (37.1 C) (Axillary)   Resp 17   Ht 5' 4 (1.626 m)   Wt 114.3 kg   LMP 05/11/2022   SpO2 100%   BMI 43.24 kg/m    FHT:  FHR: 150 bpm, variability: moderate,  accelerations:  Present,  decelerations:  Absent UC:   regular, every 2-4 minutes SVE:   Dilation: 5 Effacement (%): 50 Station: -3 Exam by:: Dr. Von  Labs: Lab Results  Component Value Date   WBC 7.4 06/10/2023   HGB 12.3 06/10/2023   HCT 37.9 06/10/2023   MCV 86.9 06/10/2023   PLT 213 06/10/2023    Assessment / Plan: Induction of labor due to [facility],  progressing well on pitocin . IUPC in place, MVU's not adequate.  Labor: normal induction process Preeclampsia:   denies s/sx of pre-eclamspia, no severe range BP's Fetal Wellbeing:  Category I Pain Control:  Epidural I/D:   Gbs negative Anticipated MOD:  NSVD  Joesph KATHEE Gouge, Student-MidWife 06/10/2023, 10:16 AM

## 2023-06-11 LAB — COMPREHENSIVE METABOLIC PANEL
ALT: 8 U/L (ref 0–44)
AST: 14 U/L — ABNORMAL LOW (ref 15–41)
Albumin: 2.3 g/dL — ABNORMAL LOW (ref 3.5–5.0)
Alkaline Phosphatase: 77 U/L (ref 38–126)
Anion gap: 8 (ref 5–15)
BUN: 5 mg/dL — ABNORMAL LOW (ref 6–20)
CO2: 22 mmol/L (ref 22–32)
Calcium: 9.2 mg/dL (ref 8.9–10.3)
Chloride: 108 mmol/L (ref 98–111)
Creatinine, Ser: 0.74 mg/dL (ref 0.44–1.00)
GFR, Estimated: >60 mL/min (ref >60)
Glucose, Bld: 80 mg/dL (ref 70–99)
Potassium: 4.1 mmol/L (ref 3.5–5.1)
Sodium: 138 mmol/L (ref 135–145)
Total Bilirubin: 0.2 mg/dL (ref 0.0–1.2)
Total Protein: 5.4 g/dL — ABNORMAL LOW (ref 6.5–8.1)

## 2023-06-11 LAB — CBC
HCT: 34.4 % — ABNORMAL LOW (ref 36.0–46.0)
Hemoglobin: 11.1 g/dL — ABNORMAL LOW (ref 12.0–15.0)
MCH: 28.2 pg (ref 26.0–34.0)
MCHC: 32.3 g/dL (ref 30.0–36.0)
MCV: 87.3 fL (ref 80.0–100.0)
Platelets: 194 10*3/uL (ref 150–400)
RBC: 3.94 MIL/uL (ref 3.87–5.11)
RDW: 14.9 % (ref 11.5–15.5)
WBC: 7.8 10*3/uL (ref 4.0–10.5)
nRBC: 0 % (ref 0.0–0.2)

## 2023-06-11 LAB — BIRTH TISSUE RECOVERY COLLECTION (PLACENTA DONATION)

## 2023-06-11 MED ORDER — NIFEDIPINE ER OSMOTIC RELEASE 30 MG PO TB24
30.0000 mg | ORAL_TABLET | Freq: Once | ORAL | Status: AC
  Administered 2023-06-11: 30 mg via ORAL
  Filled 2023-06-11: qty 1

## 2023-06-11 NOTE — Clinical Social Work Maternal (Addendum)
 CLINICAL SOCIAL WORK MATERNAL/CHILD NOTE  Patient Details  Name: Samantha Stanton MRN: [id number] Date of Birth: 1995-05-29  Date:  06/11/2023  Clinical Social Worker Initiating Note:  Rosina Molt Date/Time: Initiated:  06/11/23/1234     Child's Name:  Samantha Stanton   Biological Parents:  Mother, Father Samantha Stanton  [birth year] Samantha Stanton 06-16-1993)   Need for Interpreter:  None   Reason for Referral:  Late or No Prenatal Care  , Behavioral Health Concerns   Address:  [address] [place] [address]    Phone number:  [phone] (home)     Additional phone number:   Household Members/Support Persons (HM/SP):   Household Member/Support Person 1, Household Member/Support Person 2, Household Member/Support Person 3, Household Member/Support Person 4   HM/SP Name Relationship DOB or Age  HM/SP -1 Karter Vita Son 46 years old  HM/SP -2 Dallas Stiff Son 63years old  HM/SP -3 Samantha Stiff Son 32 years old  HM/SP -4 Pamela Vrooman MOB's mom    HM/SP -5        HM/SP -6        HM/SP -7        HM/SP -8          Natural Supports (not living in the home):  Immediate Family, Children   Professional Supports: None   Employment: Full-time   Type of Work: [employer]   Education:  Some College   Homebound arranged:    Surveyor, Quantity Resources:      Other Resources:  Sales Executive     Cultural/Religious Considerations Which May Impact Care:    Strengths:  Home prepared for child  , Pediatrician chosen, Ability to meet basic needs  , Understanding of illness   Psychotropic Medications:         Pediatrician:    [place] area  Pediatrician List:   [facility]  [place]    [place]      Pediatrician Fax Number:    Risk Factors/Current Problems:  Mental Health Concerns     Cognitive State:  Able to Concentrate  , Alert  , Linear Thinking  , Insightful  , Goal Oriented      Mood/Affect:  Comfortable  , Calm  , Interested  , Relaxed     CSW Assessment: CSW received a consult for anxiety, Edinburgh score of 9, needs a carseat and per chart review MOB obtained only 1 prenatal care visit while in [place]. CSW met MOB at bedside to complete a full psychosocial assessment and offer support. CSW entered the room, introduced herself and explained the reason for the visit. MOB was polite, easy to engage, receptive to meeting with CSW, and appeared forthcoming.  CSW collected MOB's demographic information and inquired about her mental health history. MOB reported being diagnosed with anxiety in 2021. MOB reported being prescribed medication and found the support helpful. MOB reported PPD with both of her last children due to Covid precautions and worried for the infant to get sick, and her sons autism diagnoses. MOB reported for support she began therapy and being prescribed medication. CSW provided education regarding the baby blues period vs. perinatal mood disorders, discussed treatment and gave resources for mental health follow up if concerns arise.  CSW recommends self-evaluation during the postpartum time period using the New Mom Checklist from Postpartum Progress and encouraged MOB to contact a medical professional if symptoms are  noted at any time. CSW assessed for safety with MOB SI/HI/DV;MOB denied all.   CSW asked MOB does she receive support resources; MOB said yes to receiving foodstamps and interested in [facility] support. CSW informed MOB that a [facility] referral will be completed today and to follow up In a few days with the resources provided; MOB was understanding. MOB reported having all essential items for the infant including a pack and play for safe sleeping and currently she does not have carseat. CSW informed MOB that hospital does not have any carseat at the moment for support is she able to obtain one prior to discharge; MOB said yes. CSW provided review of Sudden Infant  Death Syndrome (SIDS) precautions.   CSW informed MOB due to the limited/late prenatal care during her pregnancy; the hospital will perform a UDS and CDS on the infant. If the screenings return with positive results a report to CPS will be made; MOB was understanding. MOB reported receiving prenatal care visits in [place]  beginning in August she was 17weeks at the [employer]; however she did not have the documentation of the visits. MOB denied use of any/all substances.   The infant urine was not collected; however CSW is awaiting the results for cord and will make a CPS report if warranted.  CSW received and acknowledges consult for EDPS of 9.  Consult screened out due to 9 on EDPS does not warrant a CSW consult.  MOB whom scores are greater than 9/yes to question 10 on Edinburgh Postpartum Depression Screen warrants a CSW consult.   CSW Plan/Description:  No Further Intervention Required/No Barriers to Discharge, Sudden Infant Death Syndrome (SIDS) Education, Perinatal Mood and Anxiety Disorder (PMADs) Education, Hospital Drug Screen Policy Information, Other Information/Referral to [facility], CSW Will Continue to Monitor Umbilical Cord Tissue Drug Screen Results and Make Report if Ranelle Rosina MARLA Joshua KEN 06/11/2023, 12:42 PM

## 2023-06-11 NOTE — Progress Notes (Signed)
 POSTPARTUM PROGRESS NOTE  Post Partum Day 1 Subjective:  Samantha Stanton is a 28 y.o. H0E5855 [redacted]w[redacted]d s/p nsvd.  No acute events overnight.  Pt denies problems with ambulating, voiding or po intake.  She denies nausea or vomiting.  Pain is well controlled.  She has had flatus. She has not had bowel movement.  Lochia Small. No ha or vision changes or sob or upper abd pain  Objective: Blood pressure (!) 151/92, pulse 66, temperature 98.2 F (36.8 C), temperature source Oral, resp. rate 17, height 5' 4 (1.626 m), weight 114.3 kg, last menstrual period 05/11/2022, SpO2 99%, unknown if currently breastfeeding.  Physical Exam:  General: alert, cooperative and no distress Lochia:normal flow Chest: CTAB Heart: RRR no m/r/g Abdomen: +BS, soft, nontender,  Uterine Fundus: firm,  DVT Evaluation: No calf swelling or tenderness Extremities: trace edema  Recent Labs    06/10/23 0934 06/11/23 0624  HGB 12.3 11.1*  HCT 37.9 34.4*    Assessment/Plan:  ASSESSMENT: Samantha Stanton is a 28 y.o. H0E5855 [redacted]w[redacted]d s/p nsvd, doing well. Bps elevated, not severe, asymptomatic, labs reassuring. Started procardia  and lasix . Breastfeeding, undecided regarding birth control.   Plan for discharge tomorrow   LOS: 2 days   Devaughn KATHEE Ban 06/11/2023, 11:21 AM

## 2023-06-12 ENCOUNTER — Encounter: Payer: Medicaid Other | Admitting: Obstetrics and Gynecology

## 2023-06-12 ENCOUNTER — Other Ambulatory Visit (HOSPITAL_COMMUNITY): Payer: Self-pay

## 2023-06-12 MED ORDER — IBUPROFEN 800 MG PO TABS
800.0000 mg | ORAL_TABLET | Freq: Three times a day (TID) | ORAL | 0 refills | Status: AC
  Filled 2023-06-12: qty 30, 10d supply, fill #0

## 2023-06-12 MED ORDER — ACETAMINOPHEN 500 MG PO TABS
1000.0000 mg | ORAL_TABLET | Freq: Three times a day (TID) | ORAL | 0 refills | Status: AC
  Filled 2023-06-12: qty 30, 5d supply, fill #0

## 2023-06-12 MED ORDER — FUROSEMIDE 40 MG PO TABS
40.0000 mg | ORAL_TABLET | Freq: Every day | ORAL | 0 refills | Status: DC
  Filled 2023-06-12: qty 5, 5d supply, fill #0

## 2023-06-12 MED ORDER — NIFEDIPINE ER 30 MG PO TB24
30.0000 mg | ORAL_TABLET | Freq: Every day | ORAL | 2 refills | Status: AC
  Filled 2023-06-12: qty 30, 30d supply, fill #0

## 2023-06-12 MED ORDER — POLYETHYLENE GLYCOL 3350 17 GM/SCOOP PO POWD
17.0000 g | Freq: Every day | ORAL | 1 refills | Status: DC | PRN
  Filled 2023-06-12: qty 476, 28d supply, fill #0

## 2023-06-12 NOTE — Lactation Note (Signed)
 This note was copied from a baby's chart. Lactation Consultation Note  Patient Name: Samantha Stanton [id number] Date: 06/12/2023 Age:27 hours  Attempted to see mom but was sleeping.   Maternal Data    Feeding    LATCH Score                    Lactation Tools Discussed/Used    Interventions    Discharge    Consult Status      Elizabella Nolet G 06/12/2023, 6:09 AM

## 2023-06-12 NOTE — Lactation Note (Signed)
 This note was copied from a baby's chart. Lactation Consultation Note  Patient Name: Samantha Stanton [id number] Date: 06/12/2023 Age:27 hours Reason for consult: Follow-up assessment;Early term 37-38.6wks  P4, 37 wks, @ 49 hrs of life. Mom anticipates discharge today. Mom feeding baby pumped breast milk with LC arrival- mom feels milk is coming in. Discussed expectations of milk progression- highlighted risk of engorgement- milk comes in fast/strong. Discussed hand pump/ hand expression to reduce pressure in breast- to be able to latch baby or pump cycle, take motrin  as anti-inflammatory, and ice packs for 10-20 minutes if still engorged after feeding. Discussed wearable pump- watching for engorgement building if if breast not being emptied well. Hand pump can move milk when breast engorged- better then other pumping choices- provided for mom to take home- just in case. LC services, milk storage, and pump cleaning all shared with mom.   Feeding Mother's Current Feeding Choice: Breast Milk Nipple Type: Slow - flow  Lactation Tools Discussed/Used Tools: Pump Flange Size: 24 Pump Education: Milk Storage;Setup, frequency, and cleaning  Interventions Interventions: Hand pump;Education;LC Services brochure;CDC Guidelines for Breast Pump Cleaning (Milk Storage Guidelines)  Discharge Discharge Education: Engorgement and breast care (Hand pump provided to mom- 24mm flanges - best way to release pressure of engorgement if wearable pump not able to) Pump: Hands Free;Personal  Consult Status Consult Status: Complete Date: 06/12/23    Firman Hughes 06/12/2023, 9:57 AM

## 2023-06-13 ENCOUNTER — Ambulatory Visit (HOSPITAL_COMMUNITY): Payer: Self-pay

## 2023-06-13 NOTE — Lactation Note (Signed)
This note was copied from a baby's chart. Lactation Consultation Note  Patient Name: Samantha Stanton [id number] Date: 06/13/2023 Age:27 hours Reason for consult: Follow-up assessment;Early term 37-38.6wks  P4, 37 wks, @ 74 hrs of life. Infant on photo-therapy. Mom has pumped whiter colored milk on table, getting a couple ounces each time. Discussed/provided bigger bottles. Per mom, not getting engorged- but nipples getting sore. Discussed/Provided ice packs that could be used for engorgement, coconut oil for nipple health. Highlighted mom unintentionally using 2 different size flanges- demonstrated how to tell apart, and encouraged sizes are dynamic- changes in first weeks as needed. Encouraged mom to keep moving milk regularly for best production. Mom shares she's excited- easiest her milk has ever came in. Praised mom for recognizing- easier with each baby. Engorgement care for breasts, re-enforced from yesterday.  Maternal Data Does the patient have breastfeeding experience prior to this delivery?: Yes  Feeding Mother's Current Feeding Choice: Breast Milk and Formula  Lactation Tools Discussed/Used Tools: Pump Flange Size: 24;21 Breast pump type: Double-Electric Breast Pump  Interventions Interventions: Hand express;Breast compression;Expressed milk;Coconut oil;DEBP;Education  Discharge Discharge Education: Engorgement and breast care (Ice packs provided if needed)  Consult Status Consult Status: Complete Date: 06/13/23    Idamae Lusher 06/13/2023, 11:37 AM

## 2023-06-24 ENCOUNTER — Telehealth (HOSPITAL_COMMUNITY): Payer: Self-pay | Admitting: *Deleted

## 2023-06-24 DIAGNOSIS — Z1331 Encounter for screening for depression: Secondary | ICD-10-CM

## 2023-06-24 NOTE — Telephone Encounter (Signed)
06/24/2023  Name: Samantha Stanton MRN: [id number] DOB: [birth year]  Reason for Call:  Transition of Care Hospital Discharge Call  Contact Status: Patient Contact Status: Complete  Language assistant needed: Interpreter Mode: Interpreter Not Needed        Follow-Up Questions: Do You Have Any Concerns About Your Health As You Heal From Delivery?: Yes What Concerns Do You Have About Your Health?: She is concerned that she does not know how her blood pressures are trending.  She does not have a BP cuff at home.  Suggested checking BP at a local drugstore that has a BP machine or to call OB to ask for BP check.  Patient says she is having headaches sometimes at night - she takes prescribed ibuprofen and is able to sleep.  Denies visual disturbances, RUQ pain, or swelling of hands or feet.  Advised patient to call OB office if any of those symptoms arise. Do You Have Any Concerns About Your Infants Health?: No  Edinburgh Postnatal Depression Scale:  In the Past 7 Days: I have been able to laugh and see the funny side of things.: As much as I always could I have looked forward with enjoyment to things.: As much as I ever did I have blamed myself unnecessarily when things went wrong.: Yes, most of the time I have been anxious or worried for no good reason.: Yes, very often I have felt scared or panicky for no good reason.: Yes, sometimes Things have been getting on top of me.: No, I have been coping as well as ever I have been so unhappy that I have had difficulty sleeping.: Not at all I have felt sad or miserable.: Not very often I have been so unhappy that I have been crying.: Only occasionally The thought of harming myself has occurred to me.: Never Inocente Salles Postnatal Depression Scale Total: (!) 10  PHQ2-9 Depression Scale:     Discharge Follow-up: Edinburgh score requires follow up?: Yes Provider notified of Edinburgh score?: Yes (via [facility] order in [facility]) Have you already been referred for  a counseling appointment?: No Patient was advised of the following resources:: Breastfeeding Support Group, Support Group, Other (comment) (Maternal Mental Health resources)  Post-discharge interventions: Reviewed Newborn Safe Sleep Practices Maternal Mental Health Resources provided Placed [facility] referral in patient's chart  Salena Saner, RN 06/24/2023 11:57

## 2023-06-26 ENCOUNTER — Telehealth: Payer: Self-pay

## 2023-06-26 NOTE — Telephone Encounter (Signed)
Patient in need of bp check and lactation appointments

## 2023-06-26 NOTE — Telephone Encounter (Signed)
Received a call from nurse doing a home visit with the patient who has a history of gestational hypertension and preeclampsia. Samantha Stanton reports her bp as 138/78 and states that she is currently on 30mg  of Nifedipine, she is no longer on Lasix. Patient also reports headaches and is alternating between Tylenol and Motrin. Inocente Salles was 13 and patient is receiving counseling. Patient also reports thrush in baby, baby has nystatin but is requesting treatment for her nipples. Report given to Danella Deis and Cala Bradford [place],MD.

## 2023-06-27 ENCOUNTER — Ambulatory Visit: Payer: Self-pay

## 2023-06-28 ENCOUNTER — Other Ambulatory Visit: Payer: Self-pay | Admitting: Lactation Services

## 2023-06-28 ENCOUNTER — Encounter: Payer: Self-pay | Admitting: Lactation Services

## 2023-06-28 ENCOUNTER — Other Ambulatory Visit: Payer: Self-pay

## 2023-06-28 ENCOUNTER — Ambulatory Visit (INDEPENDENT_AMBULATORY_CARE_PROVIDER_SITE_OTHER): Payer: Self-pay

## 2023-06-28 VITALS — BP 139/94 | HR 74 | Wt 229.8 lb

## 2023-06-28 DIAGNOSIS — R030 Elevated blood-pressure reading, without diagnosis of hypertension: Secondary | ICD-10-CM

## 2023-06-28 DIAGNOSIS — O139 Gestational [pregnancy-induced] hypertension without significant proteinuria, unspecified trimester: Secondary | ICD-10-CM

## 2023-06-28 MED ORDER — NYSTATIN 100000 UNIT/GM EX CREA: TOPICAL_CREAM | CUTANEOUS | 1 refills | Status: DC

## 2023-06-28 MED ORDER — NIFEDIPINE ER OSMOTIC RELEASE 60 MG PO TB24: 60.0000 mg | ORAL_TABLET | Freq: Every day | ORAL | 2 refills | Status: AC

## 2023-06-28 NOTE — Progress Notes (Signed)
Infant with Thrush. Mom with soreness to nipples, no shooting pain in breast. Yeast Protocol given by RN. Will send message to patient via Mychart

## 2023-06-28 NOTE — Progress Notes (Unsigned)
Blood Pressure Check Visit  Samantha Stanton is here for blood pressure check following spontaneous vaginal birth on 06/10/23 with gestational hypertension. Reports a history of pre-eclampsia in past pregnancy. Lasix 40 mg daily x 5 days completed following discharge. Taking Nifedipine 30 mg daily at 8:30 PM. Reports headaches only at night; ibuprofen helps minimally, sleep greatly improves headache. Describes mild headache this morning.  BP with home visiting RN on 06/26/23 was 138/78 BP at home yesterday 130s/80s  Today in office 142/107, repeat 139/94. Reviewed with Nobie Putnam, MD who gives verbal order for CBC and CMP drawn today and increase of Nifedipine to 60 mg daily beginning with dose today. Will recheck BP following lactation visit on 07/04/23.  Edinburgh score = 13, negative thoughts of harm Virtual appt [facility] (pre Fluozetine prozac 20 mg daily - felt most effective for anxiety symptoms Sertraline Zoloft 50 mg daily - more tired  2% miconazole BID  Nystatin sorry nystatin,  ketoconazole2%   Appt Thursday   Marjo Bicker, RN 06/28/2023  10:45 AM

## 2023-06-28 NOTE — Patient Instructions (Addendum)
Instructions for Mothers: - After each nursing/pumping session, rinse breasts with water or vinegar water (1 tablespoon white vinegar to 1 cup of water). Must use clean cotton ball with each application & change water daily - Apply antifungal cream to nipples/areola after rinsing as prescribed or recommended by healthcare provider (Nystatin, Lotrimin AF or Monistat cream)  - Air dry nipples - Avoid bra pads or change frequently if they are soiled. Cotton bra pads are preferable  - Wash bra, towels, washcloths, burp cloths, bibs & cotton bra pads in hot soapy water each day. - May take mild pain medicine like Ibuprofen as recommended by healthcare provider - If too sore to nurse, mother may express milk/pump and feed baby with cup, spoon, or bottle.  - Avoid eating sweets, yeast foods, & dairy products. Increase dietary acidophilus (probiotic) tablets, garlic, zinc, & B vitamins per instructions on package.  ** Do not freeze milk to be used at a later time since freezing does not kill this fungal organism.** Can feed fresh milk to infant during treatment.  Instructions for Baby: - After each feeding, wipe or rinse mouth with a small amount of water - Apply antifungal as prescribed by healthcare provider after rinsing mouth. If using gentian violet, ask for specific instructions. With Nystatin, pour measured amount in small cup and use Q-tips to swab baby's tongue/mouth to avoid contamination to antifungal container. Do not redip the q-tip. Baby may drink the rest.  - For diaper infection: Wipe bottom with soapy water, rinse, dry & apply antifungal cream as prescribed  Instructions for Mother & Baby: - Careful handwashing before and after nursing/pumping, using the bathroom, or changing diapers. Use paper towels to dry. - Boil items that come in contact with milk or infected areas such as breast shells, bottles, nipples, pump parts, pacifiers, etc for 20 minutes each day or can sterilize in  dishwasher, Microwave bag or sterilizer.  - Continue treatment for at least 2 weeks and at least 1-2 weeks after symptoms are gone. If symptoms do not improve or worsen within 5 days, notify the healthcare provider

## 2023-06-29 LAB — COMPREHENSIVE METABOLIC PANEL
ALT: 19 [IU]/L (ref 0–32)
AST: 19 [IU]/L (ref 0–40)
Albumin: 4.2 g/dL (ref 4.0–5.0)
Alkaline Phosphatase: 49 [IU]/L (ref 44–121)
BUN/Creatinine Ratio: 9 (ref 9–23)
BUN: 6 mg/dL (ref 6–20)
Bilirubin Total: 0.3 mg/dL (ref 0.0–1.2)
CO2: 23 mmol/L (ref 20–29)
Calcium: 10 mg/dL (ref 8.7–10.2)
Chloride: 104 mmol/L (ref 96–106)
Creatinine, Ser: 0.69 mg/dL (ref 0.57–1.00)
Globulin, Total: 3 g/dL (ref 1.5–4.5)
Glucose: 77 mg/dL (ref 70–99)
Potassium: 4.7 mmol/L (ref 3.5–5.2)
Sodium: 141 mmol/L (ref 134–144)
Total Protein: 7.2 g/dL (ref 6.0–8.5)
eGFR: 123 mL/min/{1.73_m2} (ref >59)

## 2023-06-29 LAB — CBC
Hematocrit: 41 % (ref 34.0–46.6)
Hemoglobin: 13.2 g/dL (ref 11.1–15.9)
MCH: 27.7 pg (ref 26.6–33.0)
MCHC: 32.2 g/dL (ref 31.5–35.7)
MCV: 86 fL (ref 79–97)
Platelets: 303 10*3/uL (ref 150–450)
RBC: 4.77 x10E6/uL (ref 3.77–5.28)
RDW: 12.6 % (ref 11.7–15.4)
WBC: 4 10*3/uL (ref 3.4–10.8)

## 2023-07-04 ENCOUNTER — Ambulatory Visit: Payer: Self-pay

## 2023-07-04 DIAGNOSIS — F419 Anxiety disorder, unspecified: Secondary | ICD-10-CM

## 2023-07-05 ENCOUNTER — Other Ambulatory Visit: Payer: Self-pay | Admitting: Certified Nurse Midwife

## 2023-07-05 MED ORDER — ESCITALOPRAM OXALATE 5 MG PO TABS: 5.0000 mg | ORAL_TABLET | Freq: Every day | ORAL | 1 refills | Status: DC

## 2023-07-05 NOTE — Progress Notes (Signed)
 Patient sent message regarding anxiety, in the postpartum setting. Has tried Prozac and Zoloft  in the past, did not tolerate one of these, unclear which. Recommended starting Lexapro , prescription sent to preferred pharmacy. Has appointment 07/25/23 with this CNM.   Camie Rote, MSN, CNM, RNC-OB Certified Nurse Midwife, [facility] 07/05/2023 1:03 PM

## 2023-07-08 ENCOUNTER — Ambulatory Visit: Payer: Medicaid Other

## 2023-07-10 ENCOUNTER — Other Ambulatory Visit: Payer: Self-pay

## 2023-07-23 ENCOUNTER — Ambulatory Visit: Payer: Medicaid Other | Admitting: Certified Nurse Midwife

## 2023-08-06 ENCOUNTER — Encounter: Payer: Self-pay | Admitting: Certified Nurse Midwife

## 2023-08-06 ENCOUNTER — Ambulatory Visit: Payer: Medicaid Other | Admitting: Certified Nurse Midwife

## 2023-08-06 DIAGNOSIS — M7918 Myalgia, other site: Secondary | ICD-10-CM | POA: Diagnosis not present

## 2023-08-06 DIAGNOSIS — R32 Unspecified urinary incontinence: Secondary | ICD-10-CM | POA: Diagnosis not present

## 2023-08-06 DIAGNOSIS — F32A Depression, unspecified: Secondary | ICD-10-CM

## 2023-08-06 DIAGNOSIS — F419 Anxiety disorder, unspecified: Secondary | ICD-10-CM

## 2023-08-06 DIAGNOSIS — R234 Changes in skin texture: Secondary | ICD-10-CM

## 2023-08-06 DIAGNOSIS — Z3009 Encounter for other general counseling and advice on contraception: Secondary | ICD-10-CM

## 2023-08-06 MED ORDER — ESCITALOPRAM OXALATE 10 MG PO TABS: 10.0000 mg | ORAL_TABLET | Freq: Every day | ORAL | 12 refills | Status: DC

## 2023-08-06 NOTE — Progress Notes (Signed)
 Post Partum Visit Note  Samantha Stanton is a 28 y.o. [phone] female who presents for a postpartum visit. She is 8 weeks 1 day postpartum following a normal spontaneous vaginal delivery.  I have fully reviewed the prenatal and intrapartum course. The delivery was at [redacted]w[redacted]d.  Anesthesia: epidural. Postpartum course has been benign. Baby is doing well. Baby is feeding by breast. Bleeding no bleeding. Bowel function is normal. Bladder function is normal. Patient is not sexually active. Contraception method is abstinence. Postpartum depression screening: negative.  Patient reports low back pain and pain to her knees which is making it difficult to complete ADLs. She reports she was left in side-lying positions with the peanut ball in labor.    Upstream - 08/06/23 1157       Pregnancy Intention Screening   Does the patient want to become pregnant in the next year? No    Does the patient's partner want to become pregnant in the next year? No    Would the patient like to discuss contraceptive options today? Yes      Contraception Wrap Up   Current Method Abstinence    End Method Abstinence            The pregnancy intention screening data noted above was reviewed. Potential methods of contraception were discussed. The patient elected to proceed with Abstinence.   Edinburgh Postnatal Depression Scale - 08/06/23 1155       Edinburgh Postnatal Depression Scale:  In the Past 7 Days   I have been able to laugh and see the funny side of things. 0    I have looked forward with enjoyment to things. 0    I have blamed myself unnecessarily when things went wrong. 3    I have been anxious or worried for no good reason. 3    I have felt scared or panicky for no good reason. 0    Things have been getting on top of me. 0    I have been so unhappy that I have had difficulty sleeping. 0    I have felt sad or miserable. 0    I have been so unhappy that I have been crying. 0    The thought of harming  myself has occurred to me. 0    Edinburgh Postnatal Depression Scale Total 6             Health Maintenance Due  Topic Date Due   INFLUENZA VACCINE  Never done   COVID-19 Vaccine (1 - 2024-25 season) Never done    The following portions of the patient's history were reviewed and updated as appropriate: allergies, current medications, past family history, past medical history, past social history, past surgical history, and problem list.  Review of Systems Pertinent items are noted in HPI.  Objective:  BP 123/82   Pulse 74   Wt 231 lb 12.8 oz (105.1 kg)   LMP 05/11/2022   Breastfeeding Yes   BMI 39.79 kg/m    General:  alert, cooperative, and appears stated age   Breasts:  nevus noted on  patient's left breast at 3 o'clock position  Lungs: clear to auscultation bilaterally  Heart:  regular rate and rhythm, S1, S2 normal, no murmur, click, rub or gallop  Abdomen: soft, non-tender; bowel sounds normal; no masses,  no organomegaly   Wound well approximated incision  GU exam:  not indicated       Assessment:   1. Postpartum care and examination -  Doing well PP.   2. Care and examination of lactating mother - WNL exam  3. Musculoskeletal pain - Referral to PT  4. Urinary incontinence, unspecified type - Referral to PT  5. Anxiety and depression - Lexapro increased to 10mg , patient to inform us of need for increase in 4 weeks PRN.   6. Breast skin changes - Referrals to dermatology and [facility].   7. General counseling and advice on female contraception - Patient unsure if she desires more children. Currently plans abstinence for contraception. Options counseled.   WNL postpartum exam.   Plan:   Essential components of care per ACOG recommendations:  1.  Mood and well being: Patient with negative depression screening today. Reviewed local resources for support.  - Patient tobacco use? No.   - hx of drug use? No.    2. Infant care and feeding:   -Patient currently breastmilk feeding? Yes. Discussed returning to work and pumping.  -Social determinants of health (SDOH) reviewed in EPIC. No concerns  3. Sexuality, contraception and birth spacing - Patient does not know want a pregnancy in the next year.  Desired family size is 4 children.  - Reviewed reproductive life planning. Reviewed contraceptive methods based on pt preferences and effectiveness.  Patient desired Unknown/Not Reported today.   - Discussed birth spacing of 18 months  4. Sleep and fatigue -Encouraged family/partner/community support of 4 hrs of uninterrupted sleep to help with mood and fatigue  5. Physical Recovery  - Discussed patients delivery and complications. She describes her labor as good. - Patient had a Vaginal, no problems at delivery. Patient had  no  laceration. Perineal healing reviewed. Patient expressed understanding - Patient has urinary incontinence? Yes. Discussed role of pelvic floor PT. Referral sent.  - Patient is safe to resume physical and sexual activity  6.  Health Maintenance - HM due items addressed Yes - Last pap smear  Diagnosis  Date Value Ref Range Status  04/07/2021   Final   - Negative for intraepithelial lesion or malignancy (NILM)   Pap smear not due at today's visit.  -Breast Cancer screening indicated? Yes. Patient referred today for mammogram.   7. Chronic Disease/Pregnancy Condition follow up: Hypertension  - PCP follow up  Richardson Landry, CNM Center for [employer], [facility]

## 2023-08-20 ENCOUNTER — Other Ambulatory Visit: Payer: Self-pay | Admitting: Certified Nurse Midwife

## 2023-08-20 DIAGNOSIS — F32A Depression, unspecified: Secondary | ICD-10-CM

## 2023-08-20 NOTE — Telephone Encounter (Signed)
 Request for 90 day supply approved.   Samantha Snowball, MSN, CNM, RNC-OB Certified Nurse Midwife, [facility] 08/20/2023 1:31 PM

## 2023-08-22 ENCOUNTER — Ambulatory Visit

## 2023-09-11 ENCOUNTER — Encounter: Payer: Self-pay | Admitting: Family Medicine

## 2023-09-11 ENCOUNTER — Ambulatory Visit: Payer: Medicaid Other | Admitting: Family Medicine

## 2023-10-09 ENCOUNTER — Ambulatory Visit: Payer: MEDICAID | Attending: Certified Nurse Midwife | Admitting: Physical Therapy

## 2024-02-21 ENCOUNTER — Other Ambulatory Visit: Payer: Self-pay

## 2024-02-21 ENCOUNTER — Other Ambulatory Visit (HOSPITAL_COMMUNITY)
Admission: RE | Admit: 2024-02-21 | Discharge: 2024-02-21 | Disposition: A | Discharge status: Home / Self Care | Source: Ambulatory Visit | Attending: Family Medicine | Admitting: Family Medicine

## 2024-02-21 ENCOUNTER — Ambulatory Visit (INDEPENDENT_AMBULATORY_CARE_PROVIDER_SITE_OTHER): Admitting: Family Medicine

## 2024-02-21 VITALS — BP 137/92 | HR 83 | Wt 234.9 lb

## 2024-02-21 DIAGNOSIS — N898 Other specified noninflammatory disorders of vagina: Secondary | ICD-10-CM | POA: Diagnosis present

## 2024-02-21 DIAGNOSIS — F53 Postpartum depression: Secondary | ICD-10-CM | POA: Diagnosis not present

## 2024-02-21 MED ORDER — ZURZUVAE 25 MG PO CAPS: 50.0000 mg | ORAL_CAPSULE | Freq: Every day | ORAL | 0 refills | Status: AC

## 2024-02-21 NOTE — Progress Notes (Signed)
 History:   Samantha Stanton is a 28 y.o. [phone] here today for   - Vaginal odor- present for a couple weeks. Notes when she goes to the bathroom. No irritation or itching. No pain  - Depression- GAD 20 PHQ 17 EPDS 16. Reports worsening depression. She was seen at [facility] visit and started on lexapro  but was unable to tolerate it. She also saw [facility] and tried another medication but did not see any improvement in sx after 3 months. She is currently 8 month PP. Most sx are around anhedonia and she is unable to feel joy. She reports being in moments with children and not finding joy or happiness. She has been pregnant or in early parenting for the past 5 years. She notes symptoms with other children usually came on earlier and were improved by this point. She notes these are just persisting and while not effecting her children she reports they are affecting her.  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  Consider MFM consult.   Alpha thalassemia silent carrier 05/10/2021   Anxiety    Bleeding disorder    Cannot take ASA   Chlamydia infection affecting pregnancy 2013   Hypertension    Gestational HTN   Menorrhagia 11/11/2018   Pregnancy induced hypertension    with last pregnancy    Past Surgical History:  Procedure Laterality Date   DILATION AND CURETTAGE OF UTERUS     DILATION AND EVACUATION N/A 06/08/2021   Procedure: DILATATION AND EVACUATION;  Surgeon: Eveline Lynwood MATSU, MD;  Location: [facility] LD ORS;  Service: Gynecology;  Laterality: N/A;   WISDOM TOOTH EXTRACTION      The following portions of the patient's history  were reviewed and updated as appropriate: allergies, current medications, past family history, past medical history, past social history, past surgical history and problem list.   Health Maintenance:   Last pap: Lab Results  Component Value Date   DIAGPAP  04/07/2021    - Negative for intraepithelial lesion or malignancy (NILM)    Review of Systems:  Pertinent items noted in HPI and remainder of comprehensive ROS otherwise negative.  Physical Exam:  BP (!) 137/92   Pulse 83   Wt 234 lb 14.4 oz (106.5 kg)   BMI 40.32 kg/m  Gen: well appearing, NAD HEENT: no scleral icterus CV: RR Lung: Normal WOB Ext: warm well perfused   Edinburgh Postnatal Depression Scale - 02/21/24 1049       Edinburgh Postnatal Depression Scale:  In the Past 7 Days   I have been able to laugh and see the funny side of things. 1    I have looked forward with enjoyment to things. 2    I have blamed myself unnecessarily when things went wrong. 3    I have been anxious or worried for no good reason. 3    I have felt scared or panicky for no good reason. 3    Things have been getting on top of me. 2    I have been so unhappy that I have had difficulty sleeping. 0  I have felt sad or miserable. 2    I have been so unhappy that I have been crying. 0    The thought of harming myself has occurred to me. 0    Edinburgh Postnatal Depression Scale Total 16             02/21/2024   10:53 AM 06/05/2023    3:53 PM 01/12/2022    9:43 AM  PHQ9 SCORE ONLY  PHQ-9 Total Score 17 8  7       Data saved with a previous flowsheet row definition      02/21/2024   10:54 AM 06/05/2023    3:54 PM 01/12/2022    9:44 AM 08/17/2021    9:16 AM  GAD 7 : Generalized Anxiety Score  Nervous, Anxious, on Edge 2 2 0 2  Control/stop worrying 3 2 1 2   Worry too much - different things 3 2 1 2   Trouble relaxing 2 1 0 2  Restless 0 1 0 2  Easily annoyed or irritable 3 2 1 2   Afraid - awful might happen 0 0 1 2  Total GAD 7  Score 13 10 4 14       Assessment and Plan:   Problem List Items Addressed This Visit       Unprioritized   Postpartum depression - Primary   Has been on at least 2 SSRIs including Lexapro  and another she does not remember. She has severe N/V with lexapro . Her currently EPDS/PHQ9 and GAD7 indicated moderate to severe postpartum depression and she has failed two SSRI treatments.   - I reviewed Zurzuvae  for PPD - Discussed working on a different receptor - Reviewed taking medication at night - Rx sent to [facility]      Relevant Medications   Zuranolone  (ZURZUVAE ) 25 MG CAPS   Other Visit Diagnoses       Vaginal odor       Relevant Orders   Cervicovaginal ancillary only       Routine preventative health maintenance measures emphasized. Please refer to After Visit Summary for other counseling recommendations.   Return in about 4 weeks (around 03/20/2024) for Mood Check- overbook with Carolle Ishii if needed.    Total face-to-face time with patient: 30 minutes.  Over 50% of encounter was spent on counseling and coordination of care.   Suzen Maryan Masters, MD/MPH Attending Family Medicine Physician, [facility] for [facility], [facility]

## 2024-02-21 NOTE — Assessment & Plan Note (Signed)
 Has been on at least 2 SSRIs including Lexapro  and another she does not remember. She has severe N/V with lexapro . Her currently EPDS/PHQ9 and GAD7 indicated moderate to severe postpartum depression and she has failed two SSRI treatments.   - I reviewed Zurzuvae  for PPD - Discussed working on a different receptor - Reviewed taking medication at night - Rx sent to [facility] pharmacy

## 2024-02-21 NOTE — Patient Instructions (Signed)
 You were prescribed a medication called Zurzuvae - this medication is taken for only 2 week and helps postpartum depression.   You need to take this medication at night with food. You should nor drive for 12 hours after taking it

## 2024-02-24 ENCOUNTER — Ambulatory Visit: Payer: Self-pay | Admitting: Family Medicine

## 2024-02-24 ENCOUNTER — Encounter: Payer: Self-pay | Admitting: Family Medicine

## 2024-02-24 DIAGNOSIS — Z202 Contact with and (suspected) exposure to infections with a predominantly sexual mode of transmission: Secondary | ICD-10-CM

## 2024-02-24 DIAGNOSIS — B9689 Other specified bacterial agents as the cause of diseases classified elsewhere: Secondary | ICD-10-CM

## 2024-02-24 LAB — CERVICOVAGINAL ANCILLARY ONLY
Bacterial Vaginitis (gardnerella): POSITIVE — AB
Candida Glabrata: NEGATIVE
Candida Vaginitis: NEGATIVE
Chlamydia: POSITIVE — AB
Comment: NEGATIVE
Comment: NEGATIVE
Comment: NEGATIVE
Comment: NEGATIVE
Comment: NEGATIVE
Comment: NORMAL
Neisseria Gonorrhea: NEGATIVE
Trichomonas: NEGATIVE

## 2024-02-24 MED ORDER — METRONIDAZOLE 500 MG PO TABS: 500.0000 mg | ORAL_TABLET | Freq: Two times a day (BID) | ORAL | 0 refills | Status: DC

## 2024-02-24 MED ORDER — AZITHROMYCIN 500 MG PO TABS: 1000.0000 mg | ORAL_TABLET | Freq: Once | ORAL | 1 refills | Status: AC

## 2024-02-24 NOTE — Telephone Encounter (Signed)
 Addressed in another encounter.   Cyndee, Charity fundraiser

## 2024-03-02 ENCOUNTER — Telehealth: Payer: Self-pay

## 2024-03-02 NOTE — Telephone Encounter (Signed)
 Samantha Stanton from [facility] left voicemail stating that Samantha Stanton requires pre authorization for Samantha Stanton  prescribed by Dr. Eldonna. Cover My Meds Key: [id number]

## 2024-03-02 NOTE — Telephone Encounter (Signed)
 Cover my meds case information entered by RN. Prior Auth submitted to [employer] plan.    [place], RN

## 2024-03-04 ENCOUNTER — Telehealth: Payer: Self-pay | Admitting: Lactation Services

## 2024-03-04 NOTE — Telephone Encounter (Signed)
 Called [facility] to notify of approved PA. Medication has  already been sent to patient home.

## 2024-03-18 ENCOUNTER — Encounter: Payer: Self-pay | Admitting: Certified Nurse Midwife

## 2024-04-10 ENCOUNTER — Encounter: Admitting: Family Medicine

## 2024-04-10 NOTE — Progress Notes (Deleted)
   Mood check ENCOUNTER NOTE  Subjective:   Samantha Stanton is a 28 y.o. [phone] female here for a mood check   Patient reports they are ***  Patient currently breastfeeding? {yes/no:20286} Have they been on prior SSRI/SNRI? {yes/no:20286}        02/21/2024   10:54 AM 06/05/2023    3:54 PM 01/12/2022    9:44 AM 08/17/2021    9:16 AM  GAD 7 : Generalized Anxiety Score  Nervous, Anxious, on Edge 2 2 0 2  Control/stop worrying 3 2 1 2   Worry too much - different things 3 2 1 2   Trouble relaxing 2 1 0 2  Restless 0 1 0 2  Easily annoyed or irritable 3 2 1 2   Afraid - awful might happen 0 0 1 2  Total GAD 7 Score 13 10 4 14        Gynecologic History No LMP recorded. Contraception: {method:5051}  Health Maintenance Due  Topic Date Due   Influenza Vaccine  Never done   COVID-19 Vaccine (1 - 2025-26 season) Never done   Cervical Cancer Screening (Pap smear)  04/07/2024     The following portions of the patient's history were reviewed and updated as appropriate: allergies, current medications, past family history, past medical history, past social history, past surgical history and problem list.  Review of Systems {ros; complete:30496}   Objective:  There were no vitals taken for this visit. Gen: well appearing, NAD HEENT: no scleral icterus CV: RR Lung: Normal WOB Ext: warm well perfused  Pysch: ***   Assessment and Plan:  1. Postpartum depression (Primary) ***   Please refer to After Visit Summary for other counseling recommendations.   No follow-ups on file.  Suzen Maryan Masters, MD, MPH, ABFM Attending Physician Faculty Practice- Center for [facility]

## 2024-04-17 ENCOUNTER — Ambulatory Visit

## 2024-04-20 ENCOUNTER — Other Ambulatory Visit (HOSPITAL_COMMUNITY)
Admission: RE | Admit: 2024-04-20 | Discharge: 2024-04-20 | Disposition: A | Discharge status: Home / Self Care | Source: Ambulatory Visit | Attending: Family Medicine | Admitting: Family Medicine

## 2024-04-20 ENCOUNTER — Other Ambulatory Visit: Payer: Self-pay

## 2024-04-20 ENCOUNTER — Ambulatory Visit (INDEPENDENT_AMBULATORY_CARE_PROVIDER_SITE_OTHER): Admitting: *Deleted

## 2024-04-20 VITALS — BP 138/81 | HR 96 | Ht 63.0 in | Wt 252.2 lb

## 2024-04-20 DIAGNOSIS — R300 Dysuria: Secondary | ICD-10-CM

## 2024-04-20 DIAGNOSIS — Z202 Contact with and (suspected) exposure to infections with a predominantly sexual mode of transmission: Secondary | ICD-10-CM | POA: Diagnosis present

## 2024-04-20 LAB — POCT URINALYSIS DIP (DEVICE)
Bilirubin Urine: NEGATIVE
Glucose, UA: NEGATIVE mg/dL
Hgb urine dipstick: NEGATIVE
Ketones, ur: NEGATIVE mg/dL
Nitrite: NEGATIVE
Protein, ur: NEGATIVE mg/dL
Specific Gravity, Urine: 1.02 (ref 1.005–1.030)
Urobilinogen, UA: 0.2 mg/dL (ref 0.0–1.0)
pH: 6.5 (ref 5.0–8.0)

## 2024-04-20 NOTE — Addendum Note (Signed)
 Addended by: Alin Chavira L on: 04/20/2024 09:50 AM   Modules accepted: Orders

## 2024-04-20 NOTE — Progress Notes (Addendum)
 Here for self swab and UA. She reports she thinks she has unfaithful partner and already had chlamydia once and wants to get checked again. She denies any unusual smell or discharge. She wants to do UA - states she is having pain after she urinates and feels like she can't hold her urine.  UA negative except for leukocytes , sent for culture. Self swab obtained and sent. Explained will be notified if either is positive and needs treatment. Reviewed needs yearly in after March 12. BP elevated at 138/92, repeat 138/81. Advised to follow up with PCP.  She voices understanding. Rock Skip PEAK

## 2024-04-21 LAB — CERVICOVAGINAL ANCILLARY ONLY
Chlamydia: POSITIVE — AB
Comment: NEGATIVE
Comment: NEGATIVE
Comment: NORMAL
Neisseria Gonorrhea: NEGATIVE
Trichomonas: NEGATIVE

## 2024-04-22 ENCOUNTER — Telehealth: Payer: Self-pay

## 2024-04-22 DIAGNOSIS — A749 Chlamydial infection, unspecified: Secondary | ICD-10-CM

## 2024-04-22 LAB — URINE CULTURE

## 2024-04-22 MED ORDER — DOXYCYCLINE HYCLATE 100 MG PO TABS: 100.0000 mg | ORAL_TABLET | Freq: Two times a day (BID) | ORAL | 0 refills | Status: AC

## 2024-04-22 NOTE — Telephone Encounter (Signed)
 Advised pt of positive Chlamydia result and Doxycycline  sent to hr pharmacy.  Recommended partner treatment at HD and abstaining from intercourse until partner is treated and at least two weeks.  Informed pt it is recommended that she be retested in three months which would be February.  She was positive in September and reports vomiting after taking medication.  Pt agreeable to new prescription.  She verbalized understanding and had no further questions at this time.  Communicable Disease report form faxed to [facility].   Waddell, RN

## 2024-04-28 ENCOUNTER — Ambulatory Visit

## 2024-04-29 ENCOUNTER — Encounter: Payer: Self-pay | Admitting: *Deleted

## 2024-05-06 ENCOUNTER — Encounter: Admitting: Family Medicine

## 2024-05-11 ENCOUNTER — Encounter: Admitting: Family Medicine

## 2024-05-19 ENCOUNTER — Encounter: Admitting: Family Medicine

## 2024-06-17 ENCOUNTER — Ambulatory Visit

## 2024-06-23 ENCOUNTER — Encounter: Admitting: Family Medicine

## 2024-06-26 ENCOUNTER — Ambulatory Visit (INDEPENDENT_AMBULATORY_CARE_PROVIDER_SITE_OTHER): Payer: Self-pay | Admitting: Family

## 2024-06-26 ENCOUNTER — Encounter: Payer: Self-pay | Admitting: Family

## 2024-06-26 VITALS — BP 130/86 | HR 83 | Ht 63.0 in | Wt 259.2 lb

## 2024-06-26 DIAGNOSIS — Z131 Encounter for screening for diabetes mellitus: Secondary | ICD-10-CM | POA: Diagnosis not present

## 2024-06-26 DIAGNOSIS — R5383 Other fatigue: Secondary | ICD-10-CM

## 2024-06-26 NOTE — Progress Notes (Signed)
 "   Patient ID: Samantha Stanton, female    DOB: 24-Mar-1996  MRN: [id number]  CC: Follow-Up  Subjective: Samantha Stanton is a 29 y.o. female who presents for follow-up.   Her concerns today include:  - States established with Dermatology for PCOS. States Dermatology prescribed Spironolactone. States since beginning Spironolactone she has gained at least 20 pounds within the last several months. States Dermatology told her to see Primary Care for diabetes screening due to Spironolactone is not usually associated with weight gain but rather weight loss. States she doesn't eat much but does enjoy sweets especially during nighttime.  - Fatigue.  - States she had thrush on her tongue since then has resolved. States may be related to she needs to have a tooth removed and plans to see Dentistry soon.   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     Medications Ordered Prior to Encounter[1]  Allergies[2]  Social History   Socioeconomic History   Marital status: Single    Spouse name: Not on file   Number of children: Not on file   Years of education: Not on file   Highest education level: Not on file  Occupational History   Occupation: Member Advocate    Comment: [employer]  Tobacco Use   Smoking status: Never   Smokeless tobacco: Never  Vaping Use   Vaping status: Never Used  Substance and Sexual Activity   Alcohol use: Not Currently   Drug use: No   Sexual activity: Yes    Birth control/protection: None  Other Topics Concern   Not on file  Social History Narrative   Not on file   Social Drivers of Health   Tobacco Use: Low Risk (06/26/2024)   Patient History    Smoking Tobacco Use: Never    Smokeless Tobacco Use: Never    Passive Exposure: Not  on file  Financial Resource Strain: Not on file  Food Insecurity: Food Insecurity Present (06/09/2023)   Hunger Vital Sign    Worried About Running Out of Food in the Last Year: Sometimes true    Ran Out of Food in the Last Year: Sometimes true  Transportation Needs: No Transportation Needs (06/09/2023)   PRAPARE - Administrator, Civil Service (Medical): No    Lack of Transportation (Non-Medical): No  Physical Activity: Not on file  Stress: Not on file  Social Connections: Not on file  Intimate Partner Violence: Not At Risk (06/09/2023)   Humiliation, Afraid, Rape, and Kick questionnaire    Fear of Current or Ex-Partner: No    Emotionally Abused: No    Physically Abused: No    Sexually Abused: No  Depression (PHQ2-9): Medium Risk (06/26/2024)   Depression (PHQ2-9)    PHQ-2 Score: 8  Alcohol Screen: Not on file  Housing: Low Risk (06/09/2023)   Housing Stability Vital Sign    Unable to Pay for Housing in the Last Year: No    Number of Times Moved in the Last Year: 1    Homeless in the Last Year: No  Utilities: Not At Risk (06/09/2023)   [employer] Utilities    Threatened with loss of utilities: No  Health Literacy: Not on file    Family History  Problem Relation Age of Onset   Diabetes Mother  Hypertension Mother    Cancer Sister        Leukemia    Past Surgical History:  Procedure Laterality Date   DILATION AND CURETTAGE OF UTERUS     DILATION AND EVACUATION N/A 06/08/2021   Procedure: DILATATION AND EVACUATION;  Surgeon: Eveline Lynwood MATSU, MD;  Location: [facility];  Service: Gynecology;  Laterality: N/A;   WISDOM TOOTH EXTRACTION      ROS: Review of Systems Negative except as stated above  PHYSICAL EXAM: BP 130/86 (BP Location: Right Arm, Patient Position: Sitting, Cuff Size: Large)   Pulse 83   Ht 5' 3 (1.6 m)   Wt 259 lb 3.2 oz (117.6 kg)   LMP 05/25/2024   SpO2 98%   Breastfeeding No   BMI 45.92 kg/m   Wt Readings from Last 3 Encounters:  06/26/24  259 lb 3.2 oz (117.6 kg)  04/20/24 252 lb 3.2 oz (114.4 kg)  02/21/24 234 lb 14.4 oz (106.5 kg)   Physical Exam HENT:     Head: Normocephalic and atraumatic.     Nose: Nose normal.     Mouth/Throat:     Mouth: Mucous membranes are moist.     Pharynx: Oropharynx is clear.     Comments: Tongue appears normal. Eyes:     Extraocular Movements: Extraocular movements intact.     Conjunctiva/sclera: Conjunctivae normal.     Pupils: Pupils are equal, round, and reactive to light.  Cardiovascular:     Rate and Rhythm: Normal rate and regular rhythm.     Pulses: Normal pulses.     Heart sounds: Normal heart sounds.  Pulmonary:     Effort: Pulmonary effort is normal.     Breath sounds: Normal breath sounds.  Musculoskeletal:        General: Normal range of motion.     Cervical back: Normal range of motion and neck supple.  Neurological:     General: No focal deficit present.     Mental Status: She is alert and oriented to person, place, and time.  Psychiatric:        Mood and Affect: Mood normal.        Behavior: Behavior normal.     ASSESSMENT AND PLAN: 1. Diabetes mellitus screening (Primary) - Routine screening.  - Hemoglobin A1c  2. Fatigue, unspecified type - Routine screening.  - TSH - CBC - CMP14+EGFR - Vitamin D , 25-hydroxy  Patient was given the opportunity to ask questions.  Patient verbalized understanding of the plan and was able to repeat key elements of the plan. Patient was given clear instructions to go to Emergency Department or return to medical center if symptoms don't improve, worsen, or new problems develop.The patient verbalized understanding.   Orders Placed This Encounter  Procedures   Hemoglobin A1c   TSH   CBC   CMP14+EGFR   Vitamin D , 25-hydroxy    Return in 4 weeks (on 07/24/2024) for Follow-Up or next available with Raguel Blush, MD.  Greig JINNY Chute, NP      [1]  Current Outpatient Medications on File Prior to Visit  Medication Sig  Dispense Refill   escitalopram  (LEXAPRO ) 10 MG tablet TAKE 1 TABLET BY MOUTH EVERY DAY 90 tablet 5   Prenatal Vit-Fe Fumarate-FA (MULTIVITAMIN-PRENATAL) 27-0.8 MG TABS tablet Take 1 tablet by mouth daily at 12 noon.     RETIN-A 0.05 % cream Apply 1 Application topically at bedtime.     spironolactone (ALDACTONE) 100 MG tablet Take 100 mg by  mouth daily.     acetaminophen  (TYLENOL ) 500 MG tablet Take 2 tablets (1,000 mg total) by mouth every 8 (eight) hours. (Patient not taking: Reported on 06/26/2024) 30 tablet 0   ibuprofen  (ADVIL ) 800 MG tablet Take 1 tablet (800 mg total) by mouth every 8 (eight) hours. 30 tablet 0   NIFEdipine  (ADALAT  CC) 30 MG 24 hr tablet Take 1 tablet (30 mg total) by mouth daily. (Patient not taking: Reported on 06/26/2024) 30 tablet 2   NIFEdipine  (PROCARDIA  XL/NIFEDICAL XL) 60 MG 24 hr tablet Take 1 tablet (60 mg total) by mouth daily. (Patient not taking: Reported on 06/26/2024) 30 tablet 2   No current facility-administered medications on file prior to visit.  [2]  Allergies Allergen Reactions   Aspirin Other (See Comments)    Causes nose bleeds and frequent periods - age 36. Saw hematologist and told not to take ASA   "

## 2024-06-27 LAB — CMP14+EGFR
ALT: 36 [IU]/L — ABNORMAL HIGH (ref 0–32)
AST: 27 [IU]/L (ref 0–40)
Albumin: 4.2 g/dL (ref 4.0–5.0)
Alkaline Phosphatase: 45 [IU]/L (ref 41–116)
BUN/Creatinine Ratio: 9 (ref 9–23)
BUN: 7 mg/dL (ref 6–20)
Bilirubin Total: <0.2 mg/dL (ref 0.0–1.2)
CO2: 20 mmol/L (ref 20–29)
Calcium: 9.5 mg/dL (ref 8.7–10.2)
Chloride: 101 mmol/L (ref 96–106)
Creatinine, Ser: 0.8 mg/dL (ref 0.57–1.00)
Globulin, Total: 3.3 g/dL (ref 1.5–4.5)
Glucose: 116 mg/dL — ABNORMAL HIGH (ref 70–99)
Potassium: 4.6 mmol/L (ref 3.5–5.2)
Sodium: 140 mmol/L (ref 134–144)
Total Protein: 7.5 g/dL (ref 6.0–8.5)
eGFR: 103 mL/min/{1.73_m2}

## 2024-06-27 LAB — CBC
Hematocrit: 41.4 % (ref 34.0–46.6)
Hemoglobin: 13.2 g/dL (ref 11.1–15.9)
MCH: 27.9 pg (ref 26.6–33.0)
MCHC: 31.9 g/dL (ref 31.5–35.7)
MCV: 88 fL (ref 79–97)
Platelets: 335 10*3/uL (ref 150–450)
RBC: 4.73 x10E6/uL (ref 3.77–5.28)
RDW: 13 % (ref 11.7–15.4)
WBC: 5.2 10*3/uL (ref 3.4–10.8)

## 2024-06-27 LAB — TSH: TSH: 0.22 u[IU]/mL — ABNORMAL LOW (ref 0.450–4.500)

## 2024-06-27 LAB — VITAMIN D 25 HYDROXY (VIT D DEFICIENCY, FRACTURES): Vit D, 25-Hydroxy: 9.6 ng/mL — ABNORMAL LOW (ref 30.0–100.0)

## 2024-06-27 LAB — HEMOGLOBIN A1C
Est. average glucose Bld gHb Est-mCnc: 114 mg/dL
Hgb A1c MFr Bld: 5.6 % (ref 4.8–5.6)

## 2024-06-30 ENCOUNTER — Ambulatory Visit: Payer: Self-pay | Admitting: Family

## 2024-06-30 DIAGNOSIS — E0590 Thyrotoxicosis, unspecified without thyrotoxic crisis or storm: Secondary | ICD-10-CM

## 2024-06-30 DIAGNOSIS — E559 Vitamin D deficiency, unspecified: Secondary | ICD-10-CM

## 2024-06-30 MED ORDER — VITAMIN D (ERGOCALCIFEROL) 1.25 MG (50000 UNIT) PO CAPS: 50000.0000 [IU] | ORAL_CAPSULE | ORAL | 0 refills | Status: AC

## 2024-07-06 ENCOUNTER — Encounter: Admitting: Family Medicine

## 2024-07-17 ENCOUNTER — Encounter: Admitting: Family Medicine

## 2024-07-23 ENCOUNTER — Ambulatory Visit: Admitting: Obstetrics and Gynecology

## 2024-07-28 ENCOUNTER — Ambulatory Visit: Admitting: Certified Nurse Midwife

## 2024-08-07 ENCOUNTER — Encounter: Admitting: Family Medicine
